# Patient Record
Sex: Female | Born: 1935
Health system: Southern US, Community
[De-identification: ages and names within clinical notes are randomized; demographics above are authoritative.]

## PROBLEM LIST (undated history)

## (undated) DIAGNOSIS — K449 Diaphragmatic hernia without obstruction or gangrene: Secondary | ICD-10-CM

## (undated) DIAGNOSIS — D649 Anemia, unspecified: Secondary | ICD-10-CM

## (undated) DIAGNOSIS — Z8701 Personal history of pneumonia (recurrent): Secondary | ICD-10-CM

## (undated) DIAGNOSIS — E039 Hypothyroidism, unspecified: Secondary | ICD-10-CM

## (undated) DIAGNOSIS — M1990 Unspecified osteoarthritis, unspecified site: Secondary | ICD-10-CM

## (undated) DIAGNOSIS — M940 Chondrocostal junction syndrome [Tietze]: Secondary | ICD-10-CM

## (undated) DIAGNOSIS — C50919 Malignant neoplasm of unspecified site of unspecified female breast: Secondary | ICD-10-CM

## (undated) DIAGNOSIS — Z923 Personal history of irradiation: Secondary | ICD-10-CM

## (undated) DIAGNOSIS — J189 Pneumonia, unspecified organism: Secondary | ICD-10-CM

## (undated) DIAGNOSIS — K648 Other hemorrhoids: Secondary | ICD-10-CM

## (undated) DIAGNOSIS — J40 Bronchitis, not specified as acute or chronic: Secondary | ICD-10-CM

## (undated) DIAGNOSIS — C439 Malignant melanoma of skin, unspecified: Secondary | ICD-10-CM

## (undated) DIAGNOSIS — Z8719 Personal history of other diseases of the digestive system: Secondary | ICD-10-CM

## (undated) DIAGNOSIS — K219 Gastro-esophageal reflux disease without esophagitis: Secondary | ICD-10-CM

## (undated) DIAGNOSIS — J309 Allergic rhinitis, unspecified: Secondary | ICD-10-CM

## (undated) DIAGNOSIS — M7150 Other bursitis, not elsewhere classified, unspecified site: Secondary | ICD-10-CM

## (undated) DIAGNOSIS — M719 Bursopathy, unspecified: Secondary | ICD-10-CM

## (undated) DIAGNOSIS — I10 Essential (primary) hypertension: Secondary | ICD-10-CM

## (undated) DIAGNOSIS — M545 Low back pain: Secondary | ICD-10-CM

## (undated) DIAGNOSIS — D126 Benign neoplasm of colon, unspecified: Secondary | ICD-10-CM

## (undated) DIAGNOSIS — M67919 Unspecified disorder of synovium and tendon, unspecified shoulder: Secondary | ICD-10-CM

## (undated) HISTORY — DX: Other bursitis, not elsewhere classified, unspecified site: M71.50

## (undated) HISTORY — DX: Other hemorrhoids: K64.8

## (undated) HISTORY — PX: ANKLE SURGERY: SHX546

## (undated) HISTORY — DX: Unspecified osteoarthritis, unspecified site: M19.90

## (undated) HISTORY — DX: Malignant melanoma of skin, unspecified: C43.9

## (undated) HISTORY — DX: Bursopathy, unspecified: M71.9

## (undated) HISTORY — DX: Anemia, unspecified: D64.9

## (undated) HISTORY — DX: Personal history of other diseases of the digestive system: Z87.19

## (undated) HISTORY — DX: Personal history of irradiation: Z92.3

## (undated) HISTORY — DX: Chondrocostal junction syndrome (tietze): M94.0

## (undated) HISTORY — DX: Allergic rhinitis, unspecified: J30.9

## (undated) HISTORY — DX: Personal history of pneumonia (recurrent): Z87.01

## (undated) HISTORY — DX: Diaphragmatic hernia without obstruction or gangrene: K44.9

## (undated) HISTORY — DX: Essential (primary) hypertension: I10

## (undated) HISTORY — PX: OTHER SURGICAL HISTORY: SHX169

## (undated) HISTORY — PX: JOINT REPLACEMENT: SHX530

## (undated) HISTORY — DX: Gastro-esophageal reflux disease without esophagitis: K21.9

## (undated) HISTORY — PX: CATARACT EXTRACTION: SUR2

## (undated) HISTORY — DX: Unspecified disorder of synovium and tendon, unspecified shoulder: M67.919

## (undated) HISTORY — DX: Low back pain: M54.5

## (undated) HISTORY — DX: Malignant neoplasm of unspecified site of unspecified female breast: C50.919

## (undated) HISTORY — DX: Benign neoplasm of colon, unspecified: D12.6

## (undated) HISTORY — PX: TONSILLECTOMY AND ADENOIDECTOMY: SHX28

---

## 1966-11-28 HISTORY — PX: BREAST SURGERY: SHX581

## 1977-11-28 HISTORY — PX: CHOLECYSTECTOMY: SHX55

## 1998-11-24 ENCOUNTER — Other Ambulatory Visit: Admission: RE | Admit: 1998-11-24 | Discharge: 1998-11-24 | Payer: Self-pay | Admitting: Internal Medicine

## 1998-11-25 ENCOUNTER — Emergency Department (HOSPITAL_COMMUNITY): Admission: EM | Admit: 1998-11-25 | Discharge: 1998-11-25 | Payer: Self-pay | Admitting: Internal Medicine

## 1998-11-25 ENCOUNTER — Encounter: Payer: Self-pay | Admitting: Orthopedic Surgery

## 1999-06-18 ENCOUNTER — Encounter: Admission: RE | Admit: 1999-06-18 | Discharge: 1999-06-28 | Payer: Self-pay

## 1999-06-30 ENCOUNTER — Encounter: Admission: RE | Admit: 1999-06-30 | Discharge: 1999-09-28 | Payer: Self-pay

## 2001-03-26 ENCOUNTER — Encounter: Payer: Self-pay | Admitting: Family Medicine

## 2001-03-26 ENCOUNTER — Encounter: Admission: RE | Admit: 2001-03-26 | Discharge: 2001-03-26 | Payer: Self-pay | Admitting: Family Medicine

## 2001-04-09 ENCOUNTER — Ambulatory Visit (HOSPITAL_COMMUNITY): Admission: RE | Admit: 2001-04-09 | Discharge: 2001-04-09 | Payer: Self-pay | Admitting: Internal Medicine

## 2002-06-11 ENCOUNTER — Other Ambulatory Visit: Admission: RE | Admit: 2002-06-11 | Discharge: 2002-06-11 | Payer: Self-pay | Admitting: Internal Medicine

## 2002-06-24 ENCOUNTER — Encounter: Payer: Self-pay | Admitting: Family Medicine

## 2002-08-09 ENCOUNTER — Encounter (INDEPENDENT_AMBULATORY_CARE_PROVIDER_SITE_OTHER): Payer: Self-pay | Admitting: Specialist

## 2002-08-09 ENCOUNTER — Ambulatory Visit (HOSPITAL_COMMUNITY): Admission: RE | Admit: 2002-08-09 | Discharge: 2002-08-09 | Payer: Self-pay | Admitting: General Surgery

## 2004-10-05 ENCOUNTER — Emergency Department (HOSPITAL_COMMUNITY): Admission: EM | Admit: 2004-10-05 | Discharge: 2004-10-06 | Payer: Self-pay | Admitting: Emergency Medicine

## 2004-11-12 ENCOUNTER — Ambulatory Visit: Payer: Self-pay | Admitting: Family Medicine

## 2004-12-17 ENCOUNTER — Ambulatory Visit: Payer: Self-pay | Admitting: Family Medicine

## 2005-01-03 ENCOUNTER — Ambulatory Visit: Payer: Self-pay | Admitting: Family Medicine

## 2005-01-17 ENCOUNTER — Ambulatory Visit: Payer: Self-pay | Admitting: Family Medicine

## 2005-03-08 ENCOUNTER — Ambulatory Visit: Payer: Self-pay | Admitting: Family Medicine

## 2005-06-16 ENCOUNTER — Ambulatory Visit: Payer: Self-pay | Admitting: Internal Medicine

## 2005-06-21 ENCOUNTER — Ambulatory Visit: Payer: Self-pay | Admitting: Family Medicine

## 2005-08-02 ENCOUNTER — Ambulatory Visit: Payer: Self-pay | Admitting: Family Medicine

## 2005-10-06 ENCOUNTER — Ambulatory Visit: Payer: Self-pay | Admitting: Family Medicine

## 2005-10-31 ENCOUNTER — Ambulatory Visit: Payer: Self-pay | Admitting: Gastroenterology

## 2005-11-08 ENCOUNTER — Ambulatory Visit: Payer: Self-pay | Admitting: Family Medicine

## 2005-11-28 DIAGNOSIS — D126 Benign neoplasm of colon, unspecified: Secondary | ICD-10-CM

## 2005-11-28 HISTORY — DX: Benign neoplasm of colon, unspecified: D12.6

## 2005-11-29 ENCOUNTER — Ambulatory Visit: Payer: Self-pay | Admitting: Gastroenterology

## 2005-12-19 ENCOUNTER — Ambulatory Visit: Payer: Self-pay | Admitting: Gastroenterology

## 2006-04-19 ENCOUNTER — Ambulatory Visit: Payer: Self-pay | Admitting: Family Medicine

## 2006-07-18 ENCOUNTER — Ambulatory Visit: Payer: Self-pay | Admitting: Family Medicine

## 2006-10-11 ENCOUNTER — Ambulatory Visit: Payer: Self-pay | Admitting: Family Medicine

## 2006-10-18 ENCOUNTER — Ambulatory Visit: Payer: Self-pay | Admitting: Family Medicine

## 2006-10-18 ENCOUNTER — Encounter: Admission: RE | Admit: 2006-10-18 | Discharge: 2006-10-18 | Payer: Self-pay | Admitting: Family Medicine

## 2007-02-09 ENCOUNTER — Ambulatory Visit: Payer: Self-pay | Admitting: Family Medicine

## 2007-03-13 ENCOUNTER — Ambulatory Visit: Payer: Self-pay | Admitting: Gastroenterology

## 2007-04-16 ENCOUNTER — Ambulatory Visit: Payer: Self-pay | Admitting: Family Medicine

## 2007-04-18 ENCOUNTER — Encounter: Payer: Self-pay | Admitting: Family Medicine

## 2007-04-18 DIAGNOSIS — J309 Allergic rhinitis, unspecified: Secondary | ICD-10-CM

## 2007-04-18 DIAGNOSIS — K219 Gastro-esophageal reflux disease without esophagitis: Secondary | ICD-10-CM

## 2007-04-18 HISTORY — DX: Gastro-esophageal reflux disease without esophagitis: K21.9

## 2007-04-18 HISTORY — DX: Allergic rhinitis, unspecified: J30.9

## 2007-07-06 ENCOUNTER — Ambulatory Visit: Payer: Self-pay | Admitting: Family Medicine

## 2007-07-06 DIAGNOSIS — R109 Unspecified abdominal pain: Secondary | ICD-10-CM | POA: Insufficient documentation

## 2007-07-06 LAB — CONVERTED CEMR LAB
Bilirubin Urine: NEGATIVE
Glucose, Urine, Semiquant: NEGATIVE
Ketones, urine, test strip: NEGATIVE
pH: 7.5

## 2007-08-07 ENCOUNTER — Ambulatory Visit: Payer: Self-pay | Admitting: Family Medicine

## 2007-08-07 DIAGNOSIS — J019 Acute sinusitis, unspecified: Secondary | ICD-10-CM

## 2007-10-24 ENCOUNTER — Ambulatory Visit: Payer: Self-pay | Admitting: Family Medicine

## 2007-10-24 DIAGNOSIS — J209 Acute bronchitis, unspecified: Secondary | ICD-10-CM | POA: Insufficient documentation

## 2007-11-05 ENCOUNTER — Ambulatory Visit: Payer: Self-pay | Admitting: Family Medicine

## 2007-11-05 ENCOUNTER — Encounter: Payer: Self-pay | Admitting: Family Medicine

## 2007-11-05 DIAGNOSIS — M940 Chondrocostal junction syndrome [Tietze]: Secondary | ICD-10-CM | POA: Insufficient documentation

## 2007-11-05 HISTORY — DX: Chondrocostal junction syndrome (tietze): M94.0

## 2008-01-04 ENCOUNTER — Ambulatory Visit: Payer: Self-pay | Admitting: Family Medicine

## 2008-01-04 DIAGNOSIS — M7150 Other bursitis, not elsewhere classified, unspecified site: Secondary | ICD-10-CM | POA: Insufficient documentation

## 2008-01-04 HISTORY — DX: Other bursitis, not elsewhere classified, unspecified site: M71.50

## 2008-01-29 ENCOUNTER — Ambulatory Visit: Payer: Self-pay | Admitting: Gastroenterology

## 2008-02-12 ENCOUNTER — Ambulatory Visit: Payer: Self-pay | Admitting: Family Medicine

## 2008-02-12 DIAGNOSIS — B9789 Other viral agents as the cause of diseases classified elsewhere: Secondary | ICD-10-CM | POA: Insufficient documentation

## 2008-02-13 LAB — CONVERTED CEMR LAB
ALT: 19 units/L (ref 0–35)
AST: 17 units/L (ref 0–37)
Basophils Relative: 0.8 % (ref 0.0–1.0)
Bilirubin Urine: NEGATIVE
Bilirubin, Direct: 0.2 mg/dL (ref 0.0–0.3)
CO2: 31 meq/L (ref 19–32)
Calcium: 9.9 mg/dL (ref 8.4–10.5)
Chloride: 106 meq/L (ref 96–112)
Creatinine, Ser: 1 mg/dL (ref 0.4–1.2)
Eosinophils Relative: 2.1 % (ref 0.0–5.0)
GFR calc non Af Amer: 57 mL/min
Glucose, Bld: 80 mg/dL (ref 70–99)
Glucose, Urine, Semiquant: NEGATIVE
Platelets: 242 10*3/uL (ref 150–400)
Protein, U semiquant: NEGATIVE
RBC: 4.57 M/uL (ref 3.87–5.11)
RDW: 13.7 % (ref 11.5–14.6)
Total Protein: 6.7 g/dL (ref 6.0–8.3)
WBC: 8.9 10*3/uL (ref 4.5–10.5)
pH: 7

## 2008-02-19 ENCOUNTER — Emergency Department (HOSPITAL_COMMUNITY): Admission: EM | Admit: 2008-02-19 | Discharge: 2008-02-19 | Payer: Self-pay | Admitting: Emergency Medicine

## 2008-03-25 ENCOUNTER — Ambulatory Visit: Payer: Self-pay | Admitting: Internal Medicine

## 2008-03-25 ENCOUNTER — Encounter: Payer: Self-pay | Admitting: Family Medicine

## 2008-04-03 ENCOUNTER — Telehealth: Payer: Self-pay | Admitting: Family Medicine

## 2008-04-28 ENCOUNTER — Encounter: Payer: Self-pay | Admitting: Gastroenterology

## 2008-07-04 ENCOUNTER — Ambulatory Visit: Payer: Self-pay | Admitting: Family Medicine

## 2008-07-18 ENCOUNTER — Ambulatory Visit: Payer: Self-pay | Admitting: Family Medicine

## 2008-07-18 ENCOUNTER — Telehealth: Payer: Self-pay | Admitting: Family Medicine

## 2008-07-21 LAB — CONVERTED CEMR LAB
Albumin: 4.3 g/dL (ref 3.5–5.2)
BUN: 19 mg/dL (ref 6–23)
Bilirubin Urine: NEGATIVE
Calcium: 9.9 mg/dL (ref 8.4–10.5)
Creatinine, Ser: 1.08 mg/dL (ref 0.40–1.20)
Eosinophils Absolute: 0.2 10*3/uL (ref 0.0–0.7)
Eosinophils Relative: 2 % (ref 0–5)
Glucose, Bld: 89 mg/dL (ref 70–99)
Glucose, Urine, Semiquant: NEGATIVE
HCT: 38.9 % (ref 36.0–46.0)
Lymphs Abs: 3.3 10*3/uL (ref 0.7–4.0)
MCV: 88 fL (ref 78.0–100.0)
Monocytes Absolute: 0.6 10*3/uL (ref 0.1–1.0)
Monocytes Relative: 8 % (ref 3–12)
Platelets: 218 10*3/uL (ref 150–400)
RBC: 4.42 M/uL (ref 3.87–5.11)
Total Bilirubin: 0.3 mg/dL (ref 0.3–1.2)
Total CK: 54 units/L (ref 7–177)
WBC: 8.1 10*3/uL (ref 4.0–10.5)
pH: 7

## 2008-07-23 ENCOUNTER — Ambulatory Visit: Payer: Self-pay | Admitting: Family Medicine

## 2008-07-23 DIAGNOSIS — N3000 Acute cystitis without hematuria: Secondary | ICD-10-CM

## 2008-08-25 ENCOUNTER — Ambulatory Visit: Payer: Self-pay | Admitting: Family Medicine

## 2008-08-25 DIAGNOSIS — K5289 Other specified noninfective gastroenteritis and colitis: Secondary | ICD-10-CM

## 2008-08-25 LAB — CONVERTED CEMR LAB
Glucose, Urine, Semiquant: NEGATIVE
Nitrite: NEGATIVE
Specific Gravity, Urine: >=1.03
WBC Urine, dipstick: NEGATIVE
pH: 5

## 2008-09-29 ENCOUNTER — Ambulatory Visit: Payer: Self-pay | Admitting: Family Medicine

## 2008-12-15 ENCOUNTER — Ambulatory Visit: Payer: Self-pay | Admitting: Family Medicine

## 2008-12-15 LAB — CONVERTED CEMR LAB
Blood in Urine, dipstick: NEGATIVE
Nitrite: POSITIVE
Specific Gravity, Urine: 1.01
Urobilinogen, UA: 0.2

## 2008-12-16 ENCOUNTER — Encounter: Payer: Self-pay | Admitting: Family Medicine

## 2009-02-11 ENCOUNTER — Ambulatory Visit: Payer: Self-pay | Admitting: Family Medicine

## 2009-04-10 ENCOUNTER — Ambulatory Visit: Payer: Self-pay | Admitting: Family Medicine

## 2009-04-10 LAB — CONVERTED CEMR LAB
Bilirubin Urine: NEGATIVE
Blood in Urine, dipstick: NEGATIVE
Ketones, urine, test strip: NEGATIVE
Nitrite: NEGATIVE
Protein, U semiquant: NEGATIVE
Urobilinogen, UA: 0.2

## 2009-05-15 ENCOUNTER — Ambulatory Visit: Payer: Self-pay | Admitting: Family Medicine

## 2009-05-27 ENCOUNTER — Ambulatory Visit: Payer: Self-pay | Admitting: Family Medicine

## 2009-07-03 ENCOUNTER — Ambulatory Visit: Payer: Self-pay | Admitting: Family Medicine

## 2009-07-03 DIAGNOSIS — N390 Urinary tract infection, site not specified: Secondary | ICD-10-CM

## 2009-07-03 LAB — CONVERTED CEMR LAB
Bilirubin Urine: NEGATIVE
Blood in Urine, dipstick: NEGATIVE
Glucose, Urine, Semiquant: NEGATIVE
Ketones, urine, test strip: NEGATIVE
Protein, U semiquant: NEGATIVE
pH: 6

## 2009-07-04 ENCOUNTER — Encounter: Payer: Self-pay | Admitting: Family Medicine

## 2009-07-13 ENCOUNTER — Ambulatory Visit: Payer: Self-pay | Admitting: Family Medicine

## 2009-07-13 DIAGNOSIS — M545 Low back pain, unspecified: Secondary | ICD-10-CM

## 2009-07-13 HISTORY — DX: Low back pain, unspecified: M54.50

## 2009-08-14 ENCOUNTER — Ambulatory Visit: Payer: Self-pay | Admitting: Family Medicine

## 2009-08-19 ENCOUNTER — Emergency Department (HOSPITAL_COMMUNITY): Admission: EM | Admit: 2009-08-19 | Discharge: 2009-08-19 | Payer: Self-pay | Admitting: Family Medicine

## 2009-08-19 ENCOUNTER — Telehealth: Payer: Self-pay | Admitting: Family Medicine

## 2009-09-11 ENCOUNTER — Ambulatory Visit: Payer: Self-pay | Admitting: Family Medicine

## 2009-09-15 ENCOUNTER — Ambulatory Visit: Payer: Self-pay | Admitting: Family Medicine

## 2009-09-30 ENCOUNTER — Ambulatory Visit: Payer: Self-pay | Admitting: Family Medicine

## 2009-09-30 DIAGNOSIS — I10 Essential (primary) hypertension: Secondary | ICD-10-CM | POA: Insufficient documentation

## 2009-09-30 DIAGNOSIS — R05 Cough: Secondary | ICD-10-CM

## 2009-09-30 DIAGNOSIS — R059 Cough, unspecified: Secondary | ICD-10-CM | POA: Insufficient documentation

## 2009-09-30 HISTORY — DX: Essential (primary) hypertension: I10

## 2009-10-02 LAB — CONVERTED CEMR LAB
AST: 15 units/L (ref 0–37)
Albumin: 3.9 g/dL (ref 3.5–5.2)
Alkaline Phosphatase: 49 units/L (ref 39–117)
Basophils Absolute: 0 10*3/uL (ref 0.0–0.1)
Basophils Relative: 0.4 % (ref 0.0–3.0)
CO2: 31 meq/L (ref 19–32)
Chloride: 103 meq/L (ref 96–112)
Eosinophils Absolute: 0.3 10*3/uL (ref 0.0–0.7)
Glucose, Bld: 86 mg/dL (ref 70–99)
HCT: 35.5 % — ABNORMAL LOW (ref 36.0–46.0)
Hemoglobin: 12.2 g/dL (ref 12.0–15.0)
Lymphocytes Relative: 37.9 % (ref 12.0–46.0)
Lymphs Abs: 2.8 10*3/uL (ref 0.7–4.0)
MCHC: 34.3 g/dL (ref 30.0–36.0)
Neutro Abs: 3.9 10*3/uL (ref 1.4–7.7)
Potassium: 4.7 meq/L (ref 3.5–5.1)
RBC: 4.04 M/uL (ref 3.87–5.11)
RDW: 14 % (ref 11.5–14.6)
Sodium: 141 meq/L (ref 135–145)
TSH: 0.44 microintl units/mL (ref 0.35–5.50)
Total Protein: 7.1 g/dL (ref 6.0–8.3)

## 2009-11-05 ENCOUNTER — Encounter: Payer: Self-pay | Admitting: Family Medicine

## 2009-12-17 ENCOUNTER — Ambulatory Visit: Payer: Self-pay | Admitting: Family Medicine

## 2010-01-13 ENCOUNTER — Ambulatory Visit: Payer: Self-pay | Admitting: Family Medicine

## 2010-01-13 LAB — CONVERTED CEMR LAB
Bilirubin Urine: NEGATIVE
Ketones, urine, test strip: NEGATIVE
Nitrite: NEGATIVE
Specific Gravity, Urine: <1.005
Urobilinogen, UA: 0.2

## 2010-01-19 ENCOUNTER — Ambulatory Visit: Payer: Self-pay | Admitting: Family Medicine

## 2010-02-09 ENCOUNTER — Encounter: Admission: RE | Admit: 2010-02-09 | Discharge: 2010-02-09 | Payer: Self-pay | Admitting: Family Medicine

## 2010-02-12 ENCOUNTER — Encounter: Admission: RE | Admit: 2010-02-12 | Discharge: 2010-02-12 | Payer: Self-pay | Admitting: Family Medicine

## 2010-02-16 ENCOUNTER — Encounter: Payer: Self-pay | Admitting: Family Medicine

## 2010-03-02 ENCOUNTER — Telehealth: Payer: Self-pay | Admitting: Family Medicine

## 2010-03-26 ENCOUNTER — Ambulatory Visit: Payer: Self-pay | Admitting: Family Medicine

## 2010-05-06 ENCOUNTER — Ambulatory Visit: Payer: Self-pay | Admitting: Family Medicine

## 2010-05-06 DIAGNOSIS — M719 Bursopathy, unspecified: Secondary | ICD-10-CM

## 2010-05-06 DIAGNOSIS — M67919 Unspecified disorder of synovium and tendon, unspecified shoulder: Secondary | ICD-10-CM | POA: Insufficient documentation

## 2010-05-06 HISTORY — DX: Unspecified disorder of synovium and tendon, unspecified shoulder: M67.919

## 2010-05-24 ENCOUNTER — Ambulatory Visit: Payer: Self-pay | Admitting: Family Medicine

## 2010-05-24 DIAGNOSIS — R5381 Other malaise: Secondary | ICD-10-CM

## 2010-05-24 DIAGNOSIS — R5383 Other fatigue: Secondary | ICD-10-CM

## 2010-05-24 LAB — CONVERTED CEMR LAB
Protein, U semiquant: NEGATIVE
Urobilinogen, UA: 0.2
WBC Urine, dipstick: NEGATIVE

## 2010-05-26 LAB — CONVERTED CEMR LAB
Albumin: 4.3 g/dL (ref 3.5–5.2)
Alkaline Phosphatase: 59 units/L (ref 39–117)
BUN: 28 mg/dL — ABNORMAL HIGH (ref 6–23)
Basophils Absolute: 0.1 10*3/uL (ref 0.0–0.1)
CO2: 29 meq/L (ref 19–32)
Calcium: 9.4 mg/dL (ref 8.4–10.5)
Creatinine, Ser: 1.2 mg/dL (ref 0.4–1.2)
Eosinophils Absolute: 0.1 10*3/uL (ref 0.0–0.7)
GFR calc non Af Amer: 46.56 mL/min (ref >60)
Glucose, Bld: 88 mg/dL (ref 70–99)
Hemoglobin: 12.4 g/dL (ref 12.0–15.0)
Lymphocytes Relative: 31.9 % (ref 12.0–46.0)
MCHC: 33.7 g/dL (ref 30.0–36.0)
Neutro Abs: 5.5 10*3/uL (ref 1.4–7.7)
Neutrophils Relative %: 58.8 % (ref 43.0–77.0)
Platelets: 222 10*3/uL (ref 150.0–400.0)
RDW: 14.7 % — ABNORMAL HIGH (ref 11.5–14.6)
Total Bilirubin: 0.2 mg/dL — ABNORMAL LOW (ref 0.3–1.2)

## 2010-05-27 ENCOUNTER — Ambulatory Visit: Payer: Self-pay | Admitting: Family Medicine

## 2010-06-08 LAB — CONVERTED CEMR LAB
Free T4: 0.75 ng/dL (ref 0.60–1.60)
T3, Free: 2.7 pg/mL (ref 2.3–4.2)

## 2010-07-19 ENCOUNTER — Ambulatory Visit: Payer: Self-pay | Admitting: Family Medicine

## 2010-08-15 ENCOUNTER — Encounter: Payer: Self-pay | Admitting: Family Medicine

## 2010-08-16 ENCOUNTER — Encounter: Payer: Self-pay | Admitting: Family Medicine

## 2010-09-07 ENCOUNTER — Ambulatory Visit: Payer: Self-pay | Admitting: Family Medicine

## 2010-09-14 ENCOUNTER — Ambulatory Visit: Payer: Self-pay | Admitting: Family Medicine

## 2010-09-16 LAB — CONVERTED CEMR LAB
ALT: 14 units/L (ref 0–35)
Alkaline Phosphatase: 63 units/L (ref 39–117)
Basophils Relative: 0.9 % (ref 0.0–3.0)
Bilirubin, Direct: 0.1 mg/dL (ref 0.0–0.3)
Calcium: 9.9 mg/dL (ref 8.4–10.5)
Chloride: 105 meq/L (ref 96–112)
Creatinine, Ser: 1.1 mg/dL (ref 0.4–1.2)
Eosinophils Relative: 2.1 % (ref 0.0–5.0)
Lymphocytes Relative: 41.6 % (ref 12.0–46.0)
MCV: 86.2 fL (ref 78.0–100.0)
Neutrophils Relative %: 47.1 % (ref 43.0–77.0)
RBC: 4.2 M/uL (ref 3.87–5.11)
Sodium: 140 meq/L (ref 135–145)
Total Protein: 6.2 g/dL (ref 6.0–8.3)
WBC: 6.6 10*3/uL (ref 4.5–10.5)

## 2010-09-27 ENCOUNTER — Ambulatory Visit: Payer: Self-pay | Admitting: Family Medicine

## 2010-10-06 ENCOUNTER — Encounter: Payer: Self-pay | Admitting: Family Medicine

## 2010-11-28 DIAGNOSIS — C50919 Malignant neoplasm of unspecified site of unspecified female breast: Secondary | ICD-10-CM

## 2010-11-28 HISTORY — DX: Malignant neoplasm of unspecified site of unspecified female breast: C50.919

## 2010-12-08 ENCOUNTER — Encounter: Payer: Self-pay | Admitting: Gastroenterology

## 2010-12-29 ENCOUNTER — Encounter: Payer: Self-pay | Admitting: Family Medicine

## 2010-12-29 ENCOUNTER — Ambulatory Visit (INDEPENDENT_AMBULATORY_CARE_PROVIDER_SITE_OTHER): Payer: Medicare Other | Admitting: Family Medicine

## 2010-12-29 VITALS — BP 118/80 | HR 72 | Temp 98.4°F | Resp 14 | Ht 63.0 in | Wt 184.0 lb

## 2010-12-29 DIAGNOSIS — R5381 Other malaise: Secondary | ICD-10-CM

## 2010-12-29 DIAGNOSIS — R5383 Other fatigue: Secondary | ICD-10-CM

## 2010-12-29 DIAGNOSIS — N63 Unspecified lump in unspecified breast: Secondary | ICD-10-CM

## 2010-12-29 DIAGNOSIS — N6320 Unspecified lump in the left breast, unspecified quadrant: Secondary | ICD-10-CM

## 2010-12-29 NOTE — Progress Notes (Deleted)
  Subjective:    Patient ID: April [name], female    DOB: October 28, 1929, 77 y.o.   MRN: [id number]  HPI Pa    Review of Systems     Objective:   Physical Exam        Assessment & Plan:

## 2010-12-29 NOTE — Progress Notes (Signed)
  Subjective:    Patient ID: April [name], female    DOB: 1929-06-03, 77 y.o.   MRN: [id number]  HPI  Patient seen with fatigue past several days. 2 days ago had some diarrhea and nausea and poor appetite. No vomiting. Diarrhea has resolved. Poor appetite for several days now. No significant weight changes. Increased fatigue is her main complaint. History of what looks like subclinical hyperthyroidism in the past. Thyroid functions checked several months ago with normal T3 and T4 and slightly low TSH. Denies any tremor or tachycardia. Patient denies any recent fever, nasal congestion, cough, or dysuria.    Separate problem of left breast mass noted 2 weeks ago. No nipple discharge. Remote history of left breast biopsy which was benign in 1968    Review of Systems  Constitutional: Positive for appetite change and fatigue. Negative for fever, chills, diaphoresis, activity change and unexpected weight change.  HENT: Negative for congestion, sore throat, rhinorrhea and sinus pressure.   Eyes: Negative for visual disturbance.  Respiratory: Negative for cough, shortness of breath and wheezing.   Cardiovascular: Negative for chest pain, palpitations and leg swelling.  Gastrointestinal: Negative for abdominal pain and blood in stool.  Genitourinary: Negative for difficulty urinating.  Musculoskeletal: Negative for back pain.  Neurological: Negative for dizziness and headaches.  Hematological: Negative for adenopathy. Does not bruise/bleed easily.  Psychiatric/Behavioral: Negative for dysphoric mood.       Objective:   Physical Exam Patient is alert and in no distress. HEENT exam oropharynx is clear. Eardrums no acute change. Neck is supple no adenopathy or thyromegaly. Chest clear to auscultation. Heart regular rhythm and rate. Breast exam reveals no nipple discharge. Right breast no masses. Left breast lateral scar. In the region of the scar she has a palpated slightly tender thickened relatively  immobile mass approximately one and 1/2 cm in dimension. This is in the mid outer quadrant. No axillary adenopathy Abdomen is soft and nontender without mass.  extremities no edema        Assessment & Plan:  #1  fatigue in a patient with history of subclinical hyperthyroidism. Recheck TSH and T4 #2  new problem of recently noted left breast mass. Order diagnostic mammogram and ultrasound if indicated

## 2010-12-29 NOTE — Patient Instructions (Signed)
We will call you regarding mammogram appointment.

## 2010-12-30 ENCOUNTER — Telehealth: Payer: Self-pay | Admitting: *Deleted

## 2010-12-30 DIAGNOSIS — N6320 Unspecified lump in the left breast, unspecified quadrant: Secondary | ICD-10-CM

## 2010-12-30 NOTE — Assessment & Plan Note (Signed)
Summary: SORE THROAT, WEAKNESS, FATIGUE // RS   Vital Signs:  Patient profile:   77 year old female Weight:      184 pounds Temp:     98.5 degrees F oral Pulse rate:   73 / minute BP sitting:   122 / 64  (left arm) Cuff size:   large  Vitals Entered By: Alfred Levins, CMA (January 13, 2010 11:31 AM) CC: weak, chills, st   History of Present Illness: Here for the sudden onset yesterday of weakness, chills, and a mild ST. No fever or cough or body aches. No NVD. No urinary symptoms.   Allergies: 1)  Cefaclor (Cefaclor) 2)  Penicillin G Potassium (Penicillin G Potassium) 3)  Prevacid (Lansoprazole) 4)  Sulfamethoxazole (Sulfamethoxazole)  Past History:  Past Medical History: Reviewed history from 09/30/2009 and no changes required. GERD Melanoma Allergic rhinitis Hypertension, sees Dr. Jacinto Halim   Past Surgical History: Reviewed history from 01/04/2008 and no changes required. Lt Breast Biopsy (non cancerous) T&A Lt TKA Cholecystectomy  Review of Systems  The patient denies anorexia, fever, weight loss, weight gain, vision loss, decreased hearing, hoarseness, chest pain, syncope, dyspnea on exertion, peripheral edema, prolonged cough, headaches, hemoptysis, abdominal pain, melena, hematochezia, severe indigestion/heartburn, hematuria, incontinence, genital sores, muscle weakness, suspicious skin lesions, transient blindness, difficulty walking, depression, unusual weight change, abnormal bleeding, enlarged lymph nodes, angioedema, breast masses, and testicular masses.    Physical Exam  General:  Well-developed,well-nourished,in no acute distress; alert,appropriate and cooperative throughout examination Head:  Normocephalic and atraumatic without obvious abnormalities. No apparent alopecia or balding. Eyes:  No corneal or conjunctival inflammation noted. EOMI. Perrla. Funduscopic exam benign, without hemorrhages, exudates or papilledema. Vision grossly normal. Ears:   External ear exam shows no significant lesions or deformities.  Otoscopic examination reveals clear canals, tympanic membranes are intact bilaterally without bulging, retraction, inflammation or discharge. Hearing is grossly normal bilaterally. Nose:  External nasal examination shows no deformity or inflammation. Nasal mucosa are pink and moist without lesions or exudates. Mouth:  Oral mucosa and oropharynx without lesions or exudates.  Teeth in good repair. Neck:  No deformities, masses, or tenderness noted. Chest Wall:  No deformities, masses, or tenderness noted. Lungs:  Normal respiratory effort, chest expands symmetrically. Lungs are clear to auscultation, no crackles or wheezes. Heart:  Normal rate and regular rhythm. S1 and S2 normal without gallop, murmur, click, rub or other extra sounds. Abdomen:  Bowel sounds positive,abdomen soft and non-tender without masses, organomegaly or hernias noted.   Impression & Recommendations:  Problem # 1:  VIRAL INFECTION (ICD-079.99)  Her updated medication list for this problem includes:    Adult Aspirin Low Strength 81 Mg Chew (Aspirin) .Marland Kitchen... Take 1 tablet by mouth once a day    Diclofenac Sodium 50 Mg Tbec (Diclofenac sodium) .Marland Kitchen... Three times a day as needed pain  Orders: UA Dipstick w/o Micro (manual) (43329)  Complete Medication List: 1)  Adult Aspirin Low Strength 81 Mg Chew (Aspirin) .... Take 1 tablet by mouth once a day 2)  Lisinopril 20 Mg Tabs (Lisinopril) .Marland Kitchen.. 1 tablet by mouth once a day 3)  Metamucil 48.57 % Powd (Psyllium) .... Take 4)  Omeprazole 40 Mg Cpdr (Omeprazole) .Marland Kitchen.. 1 by mouth once daily 5)  Vitamin D 1000 Unit Tabs (Cholecalciferol) .Marland Kitchen.. 1 by mouth once daily 6)  Vitamin D 51884 Unit Caps (Ergocalciferol) .Marland Kitchen.. 1 by mouth once daily 7)  Diclofenac Sodium 50 Mg Tbec (Diclofenac sodium) .... Three times a day as needed pain  8)  Promethazine Hcl 25 Mg Tabs (Promethazine hcl) .Marland Kitchen.. 1 every 4 hours as needed  nausea  Patient Instructions: 1)  rest, drink fluids.  2)  Please schedule a follow-up appointment as needed .   Laboratory Results   Urine Tests  Date/Time Received: January 13, 2010 12:48 PM Date/Time Reported: January 13, 2010 12:48 PM  Routine Urinalysis   Color: yellow Appearance: Clear Glucose: negative   (Normal Range: Negative) Bilirubin: negative   (Normal Range: Negative) Ketone: negative   (Normal Range: Negative) Spec. Gravity: <1.005   (Normal Range: 1.003-1.035) Blood: negative   (Normal Range: Negative) pH: 7.0   (Normal Range: 5.0-8.0) Protein: negative   (Normal Range: Negative) Urobilinogen: 0.2   (Normal Range: 0-1) Nitrite: negative   (Normal Range: Negative) Leukocyte Esterace: negative   (Normal Range: Negative)    Comments: Alfred Levins, CMA  January 13, 2010 12:48 PM

## 2010-12-30 NOTE — Assessment & Plan Note (Signed)
Summary: ?? sinus infection??//lch   Vital Signs:  Patient profile:   77 year old female Weight:      178 pounds O2 Sat:      97 % Temp:     98.5 degrees F Pulse rate:   86 / minute BP sitting:   130 / 82  (left arm)  Vitals Entered By: Pura Spice, RN (September 07, 2010 10:34 AM) CC: sinus inf. sore throat, nauseaed.   History of Present Illness: Here for 3 days of sinus pressure, HA, PND, and a ST. No fever or cough.   Allergies: 1)  Cefaclor (Cefaclor) 2)  Penicillin G Potassium (Penicillin G Potassium) 3)  Prevacid (Lansoprazole) 4)  Sulfamethoxazole (Sulfamethoxazole)  Past History:  Past Medical History: Reviewed history from 09/30/2009 and no changes required. GERD Melanoma Allergic rhinitis Hypertension, sees Dr. Jacinto Halim   Review of Systems  The patient denies anorexia, fever, weight loss, weight gain, vision loss, decreased hearing, hoarseness, chest pain, syncope, dyspnea on exertion, peripheral edema, prolonged cough, hemoptysis, abdominal pain, melena, hematochezia, severe indigestion/heartburn, hematuria, incontinence, genital sores, muscle weakness, suspicious skin lesions, transient blindness, difficulty walking, depression, unusual weight change, abnormal bleeding, enlarged lymph nodes, angioedema, breast masses, and testicular masses.    Physical Exam  General:  Well-developed,well-nourished,in no acute distress; alert,appropriate and cooperative throughout examination Head:  Normocephalic and atraumatic without obvious abnormalities. No apparent alopecia or balding. Eyes:  No corneal or conjunctival inflammation noted. EOMI. Perrla. Funduscopic exam benign, without hemorrhages, exudates or papilledema. Vision grossly normal. Ears:  External ear exam shows no significant lesions or deformities.  Otoscopic examination reveals clear canals, tympanic membranes are intact bilaterally without bulging, retraction, inflammation or discharge. Hearing is grossly  normal bilaterally. Nose:  External nasal examination shows no deformity or inflammation. Nasal mucosa are pink and moist without lesions or exudates. Mouth:  Oral mucosa and oropharynx without lesions or exudates.  Teeth in good repair. Neck:  No deformities, masses, or tenderness noted. Lungs:  Normal respiratory effort, chest expands symmetrically. Lungs are clear to auscultation, no crackles or wheezes.   Impression & Recommendations:  Problem # 1:  SINUSITIS, ACUTE NOS (ICD-461.9)  Her updated medication list for this problem includes:    Doxycycline Hyclate 100 Mg Caps (Doxycycline hyclate) .Marland Kitchen..Marland Kitchen Two times a day  Complete Medication List: 1)  Adult Aspirin Low Strength 81 Mg Chew (Aspirin) .... Take 1 tablet by mouth once a day 2)  Lisinopril 20 Mg Tabs (Lisinopril) .Marland Kitchen.. 1 tablet by mouth once a day 3)  Metamucil 48.57 % Powd (Psyllium) .... Take 4)  Omeprazole 40 Mg Cpdr (Omeprazole) .Marland Kitchen.. 1 by mouth once daily 5)  Vitamin D 1000 Unit Tabs (Cholecalciferol) .Marland Kitchen.. 1 by mouth once daily 6)  Vitamin D 44034 Unit Caps (Ergocalciferol) .Marland Kitchen.. 1 by mouth once daily 7)  Diclofenac Sodium 50 Mg Tbec (Diclofenac sodium) .... Three times a day as needed pain 8)  Promethazine Hcl 25 Mg Tabs (Promethazine hcl) .Marland Kitchen.. 1 every 4 hours as needed nausea 9)  Doxycycline Hyclate 100 Mg Caps (Doxycycline hyclate) .... Two times a day  Patient Instructions: 1)  Please schedule a follow-up appointment as needed .  Prescriptions: DOXYCYCLINE HYCLATE 100 MG CAPS (DOXYCYCLINE HYCLATE) two times a day  #20 x 0   Entered and Authorized by:   Nelwyn Salisbury MD   Signed by:   Nelwyn Salisbury MD on 09/07/2010   Method used:   Electronically to        [facility]* (retail)       [address]       [place], [place]  [id number]       Ph: [phone]       Fax: [phone]   RxID:   [id number]

## 2010-12-30 NOTE — Assessment & Plan Note (Signed)
Summary: SORE THROAT // RS   Vital Signs:  Patient profile:   77 year old female O2 Sat:      98 % Temp:     98.3 degrees F Pulse rate:   85 / minute BP sitting:   120 / 80  (left arm) Cuff size:   regular  Vitals Entered By: Pura Spice, RN (September 27, 2010 2:23 PM) CC: continous sore throat    Contraindications/Deferment of Procedures/Staging:    Test/Procedure: Weight Refused    Reason for deferment: patient declined-cannot calculate BMI   History of Present Illness: Here for continued sinus pressure, PND, and ST which has been bothering her for several months. Antibiotics have not helped very much. No fevers. Her recent labs were all normal. She has not seen an allergist for 25 years .   Allergies: 1)  Cefaclor (Cefaclor) 2)  Penicillin G Potassium (Penicillin G Potassium) 3)  Prevacid (Lansoprazole) 4)  Sulfamethoxazole (Sulfamethoxazole)  Past History:  Past Medical History: Reviewed history from 09/30/2009 and no changes required. GERD Melanoma Allergic rhinitis Hypertension, sees Dr. Jacinto Halim   Past Surgical History: Reviewed history from 01/04/2008 and no changes required. Lt Breast Biopsy (non cancerous) T&A Lt TKA Cholecystectomy  Review of Systems  The patient denies anorexia, fever, weight loss, weight gain, vision loss, decreased hearing, hoarseness, chest pain, syncope, dyspnea on exertion, peripheral edema, prolonged cough, headaches, hemoptysis, abdominal pain, melena, hematochezia, severe indigestion/heartburn, hematuria, incontinence, genital sores, muscle weakness, suspicious skin lesions, transient blindness, difficulty walking, depression, unusual weight change, abnormal bleeding, enlarged lymph nodes, angioedema, breast masses, and testicular masses.    Physical Exam  General:  Well-developed,well-nourished,in no acute distress; alert,appropriate and cooperative throughout examination Head:  Normocephalic and atraumatic without obvious  abnormalities. No apparent alopecia or balding. Eyes:  No corneal or conjunctival inflammation noted. EOMI. Perrla. Funduscopic exam benign, without hemorrhages, exudates or papilledema. Vision grossly normal. Ears:  External ear exam shows no significant lesions or deformities.  Otoscopic examination reveals clear canals, tympanic membranes are intact bilaterally without bulging, retraction, inflammation or discharge. Hearing is grossly normal bilaterally. Nose:  External nasal examination shows no deformity or inflammation. Nasal mucosa are pink and moist without lesions or exudates. Mouth:  Oral mucosa and oropharynx without lesions or exudates.  Teeth in good repair. Neck:  No deformities, masses, or tenderness noted. Lungs:  Normal respiratory effort, chest expands symmetrically. Lungs are clear to auscultation, no crackles or wheezes.   Impression & Recommendations:  Problem # 1:  ALLERGIC RHINITIS (ICD-477.9)  Her updated medication list for this problem includes:    Promethazine Hcl 25 Mg Tabs (Promethazine hcl) .Marland Kitchen... 1 every 4 hours as needed nausea  Orders: Allergy Referral  (Allergy)  Problem # 2:  WEAKNESS (ICD-780.79)  Complete Medication List: 1)  Adult Aspirin Low Strength 81 Mg Chew (Aspirin) .... Take 1 tablet by mouth once a day 2)  Lisinopril 20 Mg Tabs (Lisinopril) .Marland Kitchen.. 1 tablet by mouth once a day 3)  Metamucil 48.57 % Powd (Psyllium) .... Take 4)  Omeprazole 40 Mg Cpdr (Omeprazole) .Marland Kitchen.. 1 by mouth once daily 5)  Vitamin D 1000 Unit Tabs (Cholecalciferol) .Marland Kitchen.. 1 by mouth once daily 6)  Vitamin D 96045 Unit Caps (Ergocalciferol) .Marland Kitchen.. 1 by mouth once daily 7)  Diclofenac Sodium 50 Mg Tbec (Diclofenac sodium) .... Three times a day as needed pain 8)  Promethazine Hcl 25 Mg Tabs (Promethazine hcl) .Marland Kitchen.. 1 every 4 hours as needed nausea  Patient Instructions: 1)  I think a lot of her problems are allergic in nature. We will refer her to Allergy for a workup  .   Orders Added: 1)  Est. Patient Level IV [[address]] 2)  Allergy Referral  [Allergy]

## 2010-12-30 NOTE — Assessment & Plan Note (Signed)
Summary: arm pain/njr   Vital Signs:  Patient profile:   77 year old female Weight:      188 pounds BP sitting:   116 / 70  (left arm) Cuff size:   regular  Vitals Entered By: Raechel Ache, RN (March 26, 2010 11:05 AM) CC: C/o R shoulder pain x weeks.   History of Present Illness: here with another bout of pain in the anterior right shoulder for 2 weeks. No trauma. She has a hx of bursitis in this area. using heat.   Allergies: 1)  Cefaclor (Cefaclor) 2)  Penicillin G Potassium (Penicillin G Potassium) 3)  Prevacid (Lansoprazole) 4)  Sulfamethoxazole (Sulfamethoxazole)  Past History:  Past Medical History: Reviewed history from 09/30/2009 and no changes required. GERD Melanoma Allergic rhinitis Hypertension, sees Dr. Jacinto Halim   Past Surgical History: Reviewed history from 01/04/2008 and no changes required. Lt Breast Biopsy (non cancerous) T&A Lt TKA Cholecystectomy  Review of Systems  The patient denies anorexia, fever, weight loss, weight gain, vision loss, decreased hearing, hoarseness, chest pain, syncope, dyspnea on exertion, peripheral edema, prolonged cough, headaches, hemoptysis, abdominal pain, melena, hematochezia, severe indigestion/heartburn, hematuria, incontinence, genital sores, muscle weakness, suspicious skin lesions, transient blindness, difficulty walking, depression, unusual weight change, abnormal bleeding, enlarged lymph nodes, angioedema, breast masses, and testicular masses.    Physical Exam  General:  Well-developed,well-nourished,in no acute distress; alert,appropriate and cooperative throughout examination Msk:  tender in the anterior right shoulder, full ROM   Impression & Recommendations:  Problem # 1:  BURSITIS (ICD-727.3)  Complete Medication List: 1)  Adult Aspirin Low Strength 81 Mg Chew (Aspirin) .... Take 1 tablet by mouth once a day 2)  Lisinopril 20 Mg Tabs (Lisinopril) .Marland Kitchen.. 1 tablet by mouth once a day 3)  Metamucil 48.57 %  Powd (Psyllium) .... Take 4)  Omeprazole 40 Mg Cpdr (Omeprazole) .Marland Kitchen.. 1 by mouth once daily 5)  Vitamin D 1000 Unit Tabs (Cholecalciferol) .Marland Kitchen.. 1 by mouth once daily 6)  Vitamin D 78295 Unit Caps (Ergocalciferol) .Marland Kitchen.. 1 by mouth once daily 7)  Diclofenac Sodium 50 Mg Tbec (Diclofenac sodium) .... Three times a day as needed pain 8)  Promethazine Hcl 25 Mg Tabs (Promethazine hcl) .Marland Kitchen.. 1 every 4 hours as needed nausea  Patient Instructions: 1)  Use Diclofenac, heat, Icy Hot rubs, rest.  2)  Please schedule a follow-up appointment as needed .  Prescriptions: DICLOFENAC SODIUM 50 MG  TBEC (DICLOFENAC SODIUM) three times a day as needed pain  #60 x 5   Entered and Authorized by:   Nelwyn Salisbury MD   Signed by:   Nelwyn Salisbury MD on 03/26/2010   Method used:   Electronically to        [facility]* (retail)       [address]       [place], [place]  [id number]       Ph: [phone]       Fax: [phone]   RxID:   [id number]

## 2010-12-30 NOTE — Consult Note (Signed)
Summary: [facility]  [facility]   Imported By: Maryln Gottron 11/12/2010 12:13:06  _____________________________________________________________________  External Attachment:    Type:   Image     Comment:   External Document

## 2010-12-30 NOTE — Assessment & Plan Note (Signed)
Summary: chills/weakness and dizziness/cjr   Vital Signs:  Patient profile:   77 year old female Weight:      179 pounds O2 Sat:      98 % Temp:     98.5 degrees F Pulse rate:   76 / minute BP sitting:   120 / 80  (left arm)  Vitals Entered By: Pura Spice, RN (September 14, 2010 11:28 AM) CC: no better chills weak dizzy. states feels like she did when she had mono   History of Present Illness: Here with continued symptoms of weakness, fatigue, and lightheadedness. This has been going on for about 10 days. We saw her one week ago when she had these same symptoms and also some sinusitis symptoms like sinus pressure and blowing green mucus from the nose. After taking Doxycycline these sinus symptoms have resolved, but the fatigue remains. No recent medication changes.   Allergies: 1)  Cefaclor (Cefaclor) 2)  Penicillin G Potassium (Penicillin G Potassium) 3)  Prevacid (Lansoprazole) 4)  Sulfamethoxazole (Sulfamethoxazole)  Past History:  Past Medical History: Reviewed history from 09/30/2009 and no changes required. GERD Melanoma Allergic rhinitis Hypertension, sees Dr. Jacinto Halim   Past Surgical History: Reviewed history from 01/04/2008 and no changes required. Lt Breast Biopsy (non cancerous) T&A Lt TKA Cholecystectomy  Review of Systems  The patient denies anorexia, fever, weight loss, weight gain, vision loss, decreased hearing, hoarseness, chest pain, syncope, dyspnea on exertion, peripheral edema, prolonged cough, headaches, hemoptysis, abdominal pain, melena, hematochezia, severe indigestion/heartburn, hematuria, incontinence, genital sores, muscle weakness, suspicious skin lesions, transient blindness, difficulty walking, depression, unusual weight change, abnormal bleeding, enlarged lymph nodes, angioedema, breast masses, and testicular masses.    Physical Exam  General:  Well-developed,well-nourished,in no acute distress; alert,appropriate and cooperative throughout  examination Head:  Normocephalic and atraumatic without obvious abnormalities. No apparent alopecia or balding. Eyes:  No corneal or conjunctival inflammation noted. EOMI. Perrla. Funduscopic exam benign, without hemorrhages, exudates or papilledema. Vision grossly normal. Ears:  External ear exam shows no significant lesions or deformities.  Otoscopic examination reveals clear canals, tympanic membranes are intact bilaterally without bulging, retraction, inflammation or discharge. Hearing is grossly normal bilaterally. Nose:  External nasal examination shows no deformity or inflammation. Nasal mucosa are pink and moist without lesions or exudates. Mouth:  Oral mucosa and oropharynx without lesions or exudates.  Teeth in good repair. Neck:  No deformities, masses, or tenderness noted. Lungs:  Normal respiratory effort, chest expands symmetrically. Lungs are clear to auscultation, no crackles or wheezes. Heart:  Normal rate and regular rhythm. S1 and S2 normal without gallop, murmur, click, rub or other extra sounds. Abdomen:  Bowel sounds positive,abdomen soft and non-tender without masses, organomegaly or hernias noted. Extremities:  No clubbing, cyanosis, edema, or deformity noted with normal full range of motion of all joints.   Neurologic:  alert & oriented X3, cranial nerves II-XII intact, strength normal in all extremities, and gait normal.   Skin:  Intact without suspicious lesions or rashes Cervical Nodes:  No lymphadenopathy noted   Impression & Recommendations:  Problem # 1:  WEAKNESS (ICD-780.79)  Orders: UA Dipstick w/o Micro (automated)  (81003) Venipuncture (14782) TLB-BMP (Basic Metabolic Panel-BMET) (80048-METABOL) TLB-CBC Platelet - w/Differential (85025-CBCD) TLB-Hepatic/Liver Function Pnl (80076-HEPATIC) TLB-TSH (Thyroid Stimulating Hormone) (84443-TSH) TLB-B12, Serum-Total ONLY (95621-H08)  Problem # 2:  SINUSITIS, ACUTE NOS (ICD-461.9)  Her updated medication list  for this problem includes:    Doxycycline Hyclate 100 Mg Caps (Doxycycline hyclate) .Marland Kitchen..Marland Kitchen Two times a day  Complete Medication List: 1)  Adult Aspirin Low Strength 81 Mg Chew (Aspirin) .... Take 1 tablet by mouth once a day 2)  Lisinopril 20 Mg Tabs (Lisinopril) .Marland Kitchen.. 1 tablet by mouth once a day 3)  Metamucil 48.57 % Powd (Psyllium) .... Take 4)  Omeprazole 40 Mg Cpdr (Omeprazole) .Marland Kitchen.. 1 by mouth once daily 5)  Vitamin D 1000 Unit Tabs (Cholecalciferol) .Marland Kitchen.. 1 by mouth once daily 6)  Vitamin D 04540 Unit Caps (Ergocalciferol) .Marland Kitchen.. 1 by mouth once daily 7)  Diclofenac Sodium 50 Mg Tbec (Diclofenac sodium) .... Three times a day as needed pain 8)  Promethazine Hcl 25 Mg Tabs (Promethazine hcl) .Marland Kitchen.. 1 every 4 hours as needed nausea 9)  Doxycycline Hyclate 100 Mg Caps (Doxycycline hyclate) .... Two times a day  Patient Instructions: 1)  It is not clear what may be causing her fatigue. We will get labs today to investigate this.    Orders Added: 1)  Est. Patient Level IV [98119] 2)  UA Dipstick w/o Micro (automated)  [81003] 3)  Venipuncture [36415] 4)  TLB-BMP (Basic Metabolic Panel-BMET) [80048-METABOL] 5)  TLB-CBC Platelet - w/Differential [85025-CBCD] 6)  TLB-Hepatic/Liver Function Pnl [80076-HEPATIC] 7)  TLB-TSH (Thyroid Stimulating Hormone) [84443-TSH] 8)  TLB-B12, Serum-Total ONLY [82607-B12]  Appended Document: Orders Update     Clinical Lists Changes  Orders: Added new Service order of Specimen Handling (14782) - Signed      Appended Document: chills/weakness and dizziness/cjr  Laboratory Results   Urine Tests    Routine Urinalysis   Color: yellow Appearance: Clear Glucose: negative   (Normal Range: Negative) Bilirubin: negative   (Normal Range: Negative) Ketone: negative   (Normal Range: Negative) Spec. Gravity: 1.010   (Normal Range: 1.003-1.035) Blood: negative   (Normal Range: Negative) pH: 7.0   (Normal Range: 5.0-8.0) Protein: negative   (Normal  Range: Negative) Urobilinogen: 0.2   (Normal Range: 0-1) Nitrite: negative   (Normal Range: Negative) Leukocyte Esterace: negative   (Normal Range: Negative)    Comments: Rita Ohara  September 14, 2010 3:10 PM

## 2010-12-30 NOTE — Letter (Signed)
Summary: Colonoscopy Letter  [facility]  [address] [place], [place] [address]   Phone: [phone]  Fax: [phone]      December 08, 2010 MRN: [id number]   [facility] [address] [place], [place]  [address]   Dear Ms. Welles,   According to your medical record, it is time for you to schedule a Colonoscopy. The American Cancer Society recommends this procedure as a method to detect early colon cancer. Patients with a family history of colon cancer, or a personal history of colon polyps or inflammatory bowel disease are at increased risk.  This letter has been generated based on the recommendations made at the time of your procedure. If you feel that in your particular situation this may no longer apply, please contact our office.  Please call our office at [phone] to schedule this appointment or to update your records at your earliest convenience.  Thank you for cooperating with [place] to provide you with the very best care possible.   Sincerely,  Judie Petit T. Russella Dar, M.D.  [facility] [phone]

## 2010-12-30 NOTE — Telephone Encounter (Signed)
Please call the [facility] about an appointment made yesterday.

## 2010-12-30 NOTE — Telephone Encounter (Signed)
Please call [facility].  This pt needs diagnostic mammogram and ultrasound if indicated. No BX ordered.

## 2010-12-30 NOTE — Progress Notes (Signed)
Summary: refill lisinopril  Phone Note From Pharmacy   Caller: [facility]* Call For: refill  Summary of Call: request for refill on lisinopril 20 mg qd Initial call taken by: Duard Brady LPN,  March 02, 2010 9:21 AM  Follow-up for Phone Call        call in #30 with 11 rf Follow-up by: Nelwyn Salisbury MD,  March 02, 2010 12:59 PM    Prescriptions: LISINOPRIL 20 MG TABS (LISINOPRIL) 1 tablet by mouth once a day  #30 x 11   Entered by:   Duard Brady LPN   Authorized by:   Nelwyn Salisbury MD   Signed by:   Duard Brady LPN on 16/08/9603   Method used:   Electronically to        [facility]* (retail)       [address]       [place], [place]  [id number]       Ph: [phone]       Fax: [phone]   RxID:   [id number]

## 2010-12-30 NOTE — Assessment & Plan Note (Signed)
Summary: severe pain in both shoulders and arms/cjr   Vital Signs:  Patient profile:   77 year old female Weight:      181 pounds Temp:     98.8 degrees F oral BP sitting:   110 / 68  (left arm) Cuff size:   regular  Vitals Entered By: Sid Falcon LPN (May 06, 1609 3:27 PM) CC: severe pain both upper extremities   History of Present Illness: Bilateral shoulder pain R > L not much improved since April.  Not much relief with diclofenac. Pain with lifiting.  no neck pain.  No injury. Achy quality pain severe at times radiating from shoulder to deltoid.  Worse  with reaching overhead and abduction.  Allergies: 1)  Cefaclor (Cefaclor) 2)  Penicillin G Potassium (Penicillin G Potassium) 3)  Prevacid (Lansoprazole) 4)  Sulfamethoxazole (Sulfamethoxazole)  Past History:  Past Medical History: Last updated: 09/30/2009 GERD Melanoma Allergic rhinitis Hypertension, sees Dr. Jacinto Halim  PMH reviewed for relevance  Review of Systems      See HPI  Physical Exam  General:  Well-developed,well-nourished,in no acute distress; alert,appropriate and cooperative throughout examination Neck:  No deformities, masses, or tenderness noted. Lungs:  Normal respiratory effort, chest expands symmetrically. Lungs are clear to auscultation, no crackles or wheezes. Heart:  normal rate and regular rhythm.   Extremities:  shoulders symmetric in appearance. Patient has pain with abduction against resistance and internal rotation. No a.c. joint tenderness. No bicipital tenderness. Neurologic:  full strength with testing deltoid and biceps muscles. Symmetric reflexes upper extremities   Impression & Recommendations:  Problem # 1:  ROTATOR CUFF SYNDROME (ICD-726.10)  patient unimproved with nonsteroidal medication. Reviewed risk and benefits of steroid injection and patient wished to proceed.  Prepped shoulders with Betadine and using sterile technique injected 2 cc of plain 1 %Xylocaine and 40 mg of  Depo-Medrol using posterior lateral approach. Patient tolerated well. Consider x-rays of shoulder and possible physical therapy if not improving  Orders: Joint Aspirate / Injection, Large (20610) Depo- Medrol 40mg  (J1030)  Complete Medication List: 1)  Adult Aspirin Low Strength 81 Mg Chew (Aspirin) .... Take 1 tablet by mouth once a day 2)  Lisinopril 20 Mg Tabs (Lisinopril) .Marland Kitchen.. 1 tablet by mouth once a day 3)  Metamucil 48.57 % Powd (Psyllium) .... Take 4)  Omeprazole 40 Mg Cpdr (Omeprazole) .Marland Kitchen.. 1 by mouth once daily 5)  Vitamin D 1000 Unit Tabs (Cholecalciferol) .Marland Kitchen.. 1 by mouth once daily 6)  Vitamin D 96045 Unit Caps (Ergocalciferol) .Marland Kitchen.. 1 by mouth once daily 7)  Diclofenac Sodium 50 Mg Tbec (Diclofenac sodium) .... Three times a day as needed pain 8)  Promethazine Hcl 25 Mg Tabs (Promethazine hcl) .Marland Kitchen.. 1 every 4 hours as needed nausea

## 2010-12-30 NOTE — Assessment & Plan Note (Signed)
Summary: fever/chest congestion/cough/nausea/chills/cjr   Vital Signs:  Patient profile:   77 year old female Temp:     98.5 degrees F oral BP sitting:   118 / 82  (left arm) Cuff size:   regular  Vitals Entered By: Alfred Levins, CMA (January 19, 2010 11:40 AM) CC: cough, congestion, pt no better   History of Present Illness: Here for slowly worsening chest congestion and a cough productive of yellow sputum. No fever. She has had this for about 10 days.   Allergies: 1)  Cefaclor (Cefaclor) 2)  Penicillin G Potassium (Penicillin G Potassium) 3)  Prevacid (Lansoprazole) 4)  Sulfamethoxazole (Sulfamethoxazole)  Past History:  Past Medical History: Reviewed history from 09/30/2009 and no changes required. GERD Melanoma Allergic rhinitis Hypertension, sees Dr. Jacinto Halim   Review of Systems  The patient denies anorexia, fever, weight loss, weight gain, vision loss, decreased hearing, hoarseness, chest pain, syncope, dyspnea on exertion, peripheral edema, headaches, hemoptysis, abdominal pain, melena, hematochezia, severe indigestion/heartburn, hematuria, incontinence, genital sores, muscle weakness, suspicious skin lesions, transient blindness, difficulty walking, depression, unusual weight change, abnormal bleeding, enlarged lymph nodes, angioedema, breast masses, and testicular masses.    Physical Exam  General:  Well-developed,well-nourished,in no acute distress; alert,appropriate and cooperative throughout examination Head:  Normocephalic and atraumatic without obvious abnormalities. No apparent alopecia or balding. Eyes:  No corneal or conjunctival inflammation noted. EOMI. Perrla. Funduscopic exam benign, without hemorrhages, exudates or papilledema. Vision grossly normal. Ears:  External ear exam shows no significant lesions or deformities.  Otoscopic examination reveals clear canals, tympanic membranes are intact bilaterally without bulging, retraction, inflammation or  discharge. Hearing is grossly normal bilaterally. Nose:  External nasal examination shows no deformity or inflammation. Nasal mucosa are pink and moist without lesions or exudates. Mouth:  Oral mucosa and oropharynx without lesions or exudates.  Teeth in good repair. Neck:  No deformities, masses, or tenderness noted. Lungs:  Normal respiratory effort, chest expands symmetrically. Lungs are clear to auscultation, no crackles or wheezes.   Impression & Recommendations:  Problem # 1:  ACUTE BRONCHITIS (ICD-466.0)  Her updated medication list for this problem includes:    Biaxin 500 Mg Tabs (Clarithromycin) .Marland Kitchen..Marland Kitchen Two times a day  Complete Medication List: 1)  Adult Aspirin Low Strength 81 Mg Chew (Aspirin) .... Take 1 tablet by mouth once a day 2)  Lisinopril 20 Mg Tabs (Lisinopril) .Marland Kitchen.. 1 tablet by mouth once a day 3)  Metamucil 48.57 % Powd (Psyllium) .... Take 4)  Omeprazole 40 Mg Cpdr (Omeprazole) .Marland Kitchen.. 1 by mouth once daily 5)  Vitamin D 1000 Unit Tabs (Cholecalciferol) .Marland Kitchen.. 1 by mouth once daily 6)  Vitamin D [address] Unit Caps (Ergocalciferol) .Marland Kitchen.. 1 by mouth once daily 7)  Diclofenac Sodium 50 Mg Tbec (Diclofenac sodium) .... Three times a day as needed pain 8)  Promethazine Hcl 25 Mg Tabs (Promethazine hcl) .Marland Kitchen.. 1 every 4 hours as needed nausea 9)  Biaxin 500 Mg Tabs (Clarithromycin) .... Two times a day  Patient Instructions: 1)  Please schedule a follow-up appointment as needed .  Prescriptions: BIAXIN 500 MG TABS (CLARITHROMYCIN) two times a day  #20 x 0   Entered and Authorized by:   Nelwyn Salisbury MD   Signed by:   Nelwyn Salisbury MD on 01/19/2010   Method used:   Electronically to        [facility]* (retail)       [address]       [place], [place]  [id number]       Ph: [phone]       Fax: [phone]   RxID:   [id number]

## 2010-12-30 NOTE — Miscellaneous (Signed)
Summary: flu inj [facility]   Clinical Lists Changes  Observations: Added new observation of FLU VAX: Historical (08/16/2010 12:45)      Immunization History:  Influenza Immunization History:    Influenza:  historical (08/16/2010) given at [facility]......gh .rn........Marland Kitchen

## 2010-12-30 NOTE — Assessment & Plan Note (Signed)
Summary: ?sinus inf/njr   Vital Signs:  Patient profile:   77 year old female Weight:      186 pounds Temp:     98.0 degrees F oral Pulse rate:   73 / minute BP sitting:   134 / 76  (left arm) Cuff size:   large  Vitals Entered By: Alfred Levins, CMA (December 17, 2009 11:46 AM) CC: sinus inf?   History of Present Illness: Here for one week of sinus pressure, PND, St, and a dry cough. No fever.   Allergies: 1)  Cefaclor (Cefaclor) 2)  Penicillin G Potassium (Penicillin G Potassium) 3)  Prevacid (Lansoprazole) 4)  Sulfamethoxazole (Sulfamethoxazole)  Past History:  Past Medical History: Reviewed history from 09/30/2009 and no changes required. GERD Melanoma Allergic rhinitis Hypertension, sees Dr. Jacinto Halim   Review of Systems  The patient denies anorexia, fever, weight loss, weight gain, vision loss, decreased hearing, hoarseness, chest pain, syncope, dyspnea on exertion, peripheral edema, hemoptysis, abdominal pain, melena, hematochezia, severe indigestion/heartburn, hematuria, incontinence, genital sores, muscle weakness, suspicious skin lesions, transient blindness, difficulty walking, depression, unusual weight change, abnormal bleeding, enlarged lymph nodes, angioedema, breast masses, and testicular masses.    Physical Exam  General:  Well-developed,well-nourished,in no acute distress; alert,appropriate and cooperative throughout examination Head:  Normocephalic and atraumatic without obvious abnormalities. No apparent alopecia or balding. Eyes:  No corneal or conjunctival inflammation noted. EOMI. Perrla. Funduscopic exam benign, without hemorrhages, exudates or papilledema. Vision grossly normal. Ears:  External ear exam shows no significant lesions or deformities.  Otoscopic examination reveals clear canals, tympanic membranes are intact bilaterally without bulging, retraction, inflammation or discharge. Hearing is grossly normal bilaterally. Nose:  External nasal  examination shows no deformity or inflammation. Nasal mucosa are pink and moist without lesions or exudates. Mouth:  Oral mucosa and oropharynx without lesions or exudates.  Teeth in good repair. Neck:  No deformities, masses, or tenderness noted. Lungs:  Normal respiratory effort, chest expands symmetrically. Lungs are clear to auscultation, no crackles or wheezes.   Impression & Recommendations:  Problem # 1:  SINUSITIS, ACUTE NOS (ICD-461.9)  Her updated medication list for this problem includes:    Doxycycline Hyclate 100 Mg Caps (Doxycycline hyclate) .Marland Kitchen..Marland Kitchen Two times a day  Complete Medication List: 1)  Adult Aspirin Low Strength 81 Mg Chew (Aspirin) .... Take 1 tablet by mouth once a day 2)  Lisinopril 20 Mg Tabs (Lisinopril) .Marland Kitchen.. 1 tablet by mouth once a day 3)  Metamucil 48.57 % Powd (Psyllium) .... Take 4)  Omeprazole 40 Mg Cpdr (Omeprazole) .Marland Kitchen.. 1 by mouth once daily 5)  Vitamin D 1000 Unit Tabs (Cholecalciferol) .Marland Kitchen.. 1 by mouth once daily 6)  Vitamin D [address] Unit Caps (Ergocalciferol) .Marland Kitchen.. 1 by mouth once daily 7)  Diclofenac Sodium 50 Mg Tbec (Diclofenac sodium) .... Three times a day as needed pain 8)  Promethazine Hcl 25 Mg Tabs (Promethazine hcl) .Marland Kitchen.. 1 every 4 hours as needed nausea 9)  Doxycycline Hyclate 100 Mg Caps (Doxycycline hyclate) .... Two times a day  Patient Instructions: 1)  Please schedule a follow-up appointment as needed .  Prescriptions: DOXYCYCLINE HYCLATE 100 MG CAPS (DOXYCYCLINE HYCLATE) two times a day  #20 x 0   Entered and Authorized by:   Nelwyn Salisbury MD   Signed by:   Nelwyn Salisbury MD on 12/17/2009   Method used:   Electronically to        [facility]* (retail)       [address]  [address]       [place], [place]  [address]       Ph: [phone]       Fax: [phone]   RxID:   [id number]

## 2010-12-30 NOTE — Telephone Encounter (Signed)
Reorder sent

## 2010-12-30 NOTE — Assessment & Plan Note (Signed)
Summary: FATIGUE / WEAKNESS // RS   Vital Signs:  Patient profile:   77 year old female Weight:      181 pounds Pulse rate:   84 / minute Pulse rhythm:   regular BP sitting:   100 / 48  (left arm) Cuff size:   regular  Vitals Entered By: Raechel Ache, RN (May 24, 2010 4:24 PM) CC: C/o fatigue and feeling "washed out"   History of Present Illness: Here for generalized fatigue over the past 5 days. No other specific complaints today at all. No SOB or nausea or fever, no change in urinations or bowel habits. She received some steroid shots recently for bilateral shoulder pains, and these helped a lot. She still has some mild aches and stiffness in the shoulders. No hip pains.   Allergies: 1)  Cefaclor (Cefaclor) 2)  Penicillin G Potassium (Penicillin G Potassium) 3)  Prevacid (Lansoprazole) 4)  Sulfamethoxazole (Sulfamethoxazole)  Review of Systems  The patient denies anorexia, fever, weight loss, weight gain, vision loss, decreased hearing, hoarseness, chest pain, syncope, dyspnea on exertion, peripheral edema, prolonged cough, headaches, hemoptysis, abdominal pain, melena, hematochezia, severe indigestion/heartburn, hematuria, incontinence, genital sores, muscle weakness, suspicious skin lesions, transient blindness, difficulty walking, depression, unusual weight change, abnormal bleeding, enlarged lymph nodes, angioedema, breast masses, and testicular masses.    Physical Exam  General:  Well-developed,well-nourished,in no acute distress; alert,appropriate and cooperative throughout examination Neck:  No deformities, masses, or tenderness noted. Lungs:  Normal respiratory effort, chest expands symmetrically. Lungs are clear to auscultation, no crackles or wheezes. Heart:  Normal rate and regular rhythm. S1 and S2 normal without gallop, murmur, click, rub or other extra sounds. Abdomen:  Bowel sounds positive,abdomen soft and non-tender without masses, organomegaly or hernias  noted.   Impression & Recommendations:  Problem # 1:  WEAKNESS (ICD-780.79)  Orders: UA Dipstick w/o Micro (automated)  (81003) Venipuncture ([address]) TLB-BMP (Basic Metabolic Panel-BMET) (80048-METABOL) TLB-CBC Platelet - w/Differential (85025-CBCD) TLB-Hepatic/Liver Function Pnl (80076-HEPATIC) TLB-TSH (Thyroid Stimulating Hormone) (84443-TSH) TLB-Sedimentation Rate (ESR) (85652-ESR)  Complete Medication List: 1)  Adult Aspirin Low Strength 81 Mg Chew (Aspirin) .... Take 1 tablet by mouth once a day 2)  Lisinopril 20 Mg Tabs (Lisinopril) .Marland Kitchen.. 1 tablet by mouth once a day 3)  Metamucil 48.57 % Powd (Psyllium) .... Take 4)  Omeprazole 40 Mg Cpdr (Omeprazole) .Marland Kitchen.. 1 by mouth once daily 5)  Vitamin D 1000 Unit Tabs (Cholecalciferol) .Marland Kitchen.. 1 by mouth once daily 6)  Vitamin D 60454 Unit Caps (Ergocalciferol) .Marland Kitchen.. 1 by mouth once daily 7)  Diclofenac Sodium 50 Mg Tbec (Diclofenac sodium) .... Three times a day as needed pain 8)  Promethazine Hcl 25 Mg Tabs (Promethazine hcl) .Marland Kitchen.. 1 every 4 hours as needed nausea  Patient Instructions: 1)  The etiology of this is unclear, although an entity like polymyalgia rheumatica comes to mind. We will get screening labs today including a ESR, and go from there.   Laboratory Results   Urine Tests    Routine Urinalysis   Color: yellow Appearance: Clear Glucose: negative   (Normal Range: Negative) Bilirubin: negative   (Normal Range: Negative) Ketone: negative   (Normal Range: Negative) Spec. Gravity: <1.005   (Normal Range: 1.003-1.035) Blood: negative   (Normal Range: Negative) pH: 5.5   (Normal Range: 5.0-8.0) Protein: negative   (Normal Range: Negative) Urobilinogen: 0.2   (Normal Range: 0-1) Nitrite: negative   (Normal Range: Negative) Leukocyte Esterace: negative   (Normal Range: Negative)    Comments: Pryor Curia  Thrasher  May 24, 2010 5:10 PM

## 2010-12-30 NOTE — Telephone Encounter (Signed)
Not dr todd's patient. [facility] needs dignostic mammogram. Order read bx. With location of breast mass

## 2010-12-30 NOTE — Assessment & Plan Note (Signed)
Summary: ? sinuses//ccm   Vital Signs:  Patient profile:   77 year old female Weight:      182 pounds Temp:     98.6 degrees F oral BP sitting:   122 / 60  (left arm) Cuff size:   regular  Vitals Entered By: Raechel Ache, RN (July 19, 2010 2:11 PM) CC: C/o sore throat, sinus cong, and tired.   History of Present Illness: Here for one week of sinus pressure, PND, and ST. No fever or cough.   Allergies: 1)  Cefaclor (Cefaclor) 2)  Penicillin G Potassium (Penicillin G Potassium) 3)  Prevacid (Lansoprazole) 4)  Sulfamethoxazole (Sulfamethoxazole)  Past History:  Past Medical History: Reviewed history from 09/30/2009 and no changes required. GERD Melanoma Allergic rhinitis Hypertension, sees Dr. Jacinto Halim   Review of Systems  The patient denies anorexia, fever, weight loss, weight gain, vision loss, decreased hearing, hoarseness, chest pain, syncope, dyspnea on exertion, peripheral edema, prolonged cough, hemoptysis, abdominal pain, melena, hematochezia, severe indigestion/heartburn, hematuria, incontinence, genital sores, muscle weakness, suspicious skin lesions, transient blindness, difficulty walking, depression, unusual weight change, abnormal bleeding, enlarged lymph nodes, angioedema, breast masses, and testicular masses.    Physical Exam  General:  Well-developed,well-nourished,in no acute distress; alert,appropriate and cooperative throughout examination Head:  Normocephalic and atraumatic without obvious abnormalities. No apparent alopecia or balding. Eyes:  No corneal or conjunctival inflammation noted. EOMI. Perrla. Funduscopic exam benign, without hemorrhages, exudates or papilledema. Vision grossly normal. Ears:  External ear exam shows no significant lesions or deformities.  Otoscopic examination reveals clear canals, tympanic membranes are intact bilaterally without bulging, retraction, inflammation or discharge. Hearing is grossly normal bilaterally. Nose:   External nasal examination shows no deformity or inflammation. Nasal mucosa are pink and moist without lesions or exudates. Mouth:  Oral mucosa and oropharynx without lesions or exudates.  Teeth in good repair. Neck:  No deformities, masses, or tenderness noted. Lungs:  Normal respiratory effort, chest expands symmetrically. Lungs are clear to auscultation, no crackles or wheezes.   Impression & Recommendations:  Problem # 1:  SINUSITIS, ACUTE NOS (ICD-461.9)  Her updated medication list for this problem includes:    Zithromax Z-pak 250 Mg Tabs (Azithromycin) .Marland Kitchen... As directed  Complete Medication List: 1)  Adult Aspirin Low Strength 81 Mg Chew (Aspirin) .... Take 1 tablet by mouth once a day 2)  Lisinopril 20 Mg Tabs (Lisinopril) .Marland Kitchen.. 1 tablet by mouth once a day 3)  Metamucil 48.57 % Powd (Psyllium) .... Take 4)  Omeprazole 40 Mg Cpdr (Omeprazole) .Marland Kitchen.. 1 by mouth once daily 5)  Vitamin D 1000 Unit Tabs (Cholecalciferol) .Marland Kitchen.. 1 by mouth once daily 6)  Vitamin D 69629 Unit Caps (Ergocalciferol) .Marland Kitchen.. 1 by mouth once daily 7)  Diclofenac Sodium 50 Mg Tbec (Diclofenac sodium) .... Three times a day as needed pain 8)  Promethazine Hcl 25 Mg Tabs (Promethazine hcl) .Marland Kitchen.. 1 every 4 hours as needed nausea 9)  Zithromax Z-pak 250 Mg Tabs (Azithromycin) .... As directed  Patient Instructions: 1)  Please schedule a follow-up appointment as needed .  Prescriptions: ZITHROMAX Z-PAK 250 MG TABS (AZITHROMYCIN) as directed  #1 x 0   Entered and Authorized by:   Nelwyn Salisbury MD   Signed by:   Nelwyn Salisbury MD on 07/19/2010   Method used:   Electronically to        [facility]* (retail)       [address]       [place],  [place]  [id number]       Ph: [phone]       Fax: [phone]   RxID:   [id number]

## 2010-12-30 NOTE — Miscellaneous (Signed)
Summary: Flu Shot/[facility]  Flu Shot/[facility]   Imported By: Maryln Gottron 08/18/2010 11:08:41  _____________________________________________________________________  External Attachment:    Type:   Image     Comment:   External Document

## 2011-01-05 ENCOUNTER — Ambulatory Visit (INDEPENDENT_AMBULATORY_CARE_PROVIDER_SITE_OTHER): Payer: Medicare Other | Admitting: Family Medicine

## 2011-01-05 ENCOUNTER — Encounter: Payer: Self-pay | Admitting: Family Medicine

## 2011-01-05 ENCOUNTER — Telehealth: Payer: Self-pay | Admitting: Family Medicine

## 2011-01-05 VITALS — BP 116/74 | HR 82 | Temp 98.3°F

## 2011-01-05 DIAGNOSIS — M545 Low back pain: Secondary | ICD-10-CM

## 2011-01-05 NOTE — Telephone Encounter (Signed)
Bring in at 2 pm

## 2011-01-05 NOTE — Progress Notes (Signed)
  Subjective:    Patient ID: April [name], female    DOB: [birth year], 77 y.o.   MRN: [id number]  HPI Here for 3 days of lower back pain. She thinks it is related to moving furniture around in her home last weekend for a party. No urinary or bowel changes. She had a temp of 99 degrees this am, but it has been gone ever since.   Review of Systems  Constitutional: Negative.   Respiratory: Negative.   Cardiovascular: Negative.   Gastrointestinal: Negative.   Genitourinary: Negative.   Musculoskeletal: Positive for back pain.       Objective:   Physical Exam  Constitutional: She appears well-developed and well-nourished. No distress.  Pulmonary/Chest: Effort normal and breath sounds normal. No respiratory distress. She has no wheezes. She has no rales. She exhibits no tenderness.  Abdominal: Soft. Bowel sounds are normal. She exhibits no distension and no mass. There is no tenderness. There is no rebound and no guarding.  Musculoskeletal:       Tender over the lower back with spasm and reduced ROM           Assessment & Plan:  Low back pain. Rest , heat. Try Motrin 800 mg tid prn .

## 2011-01-05 NOTE — Telephone Encounter (Signed)
I called pt and she has been sch for today at 2pm as noted.

## 2011-01-05 NOTE — Telephone Encounter (Signed)
Pt called and is running fever and has low back pain and chills. Pt is req a work in appt for today 01/05/11 to see Dr Clent Ridges. Pls call.

## 2011-01-31 ENCOUNTER — Encounter: Payer: Self-pay | Admitting: Family Medicine

## 2011-01-31 ENCOUNTER — Ambulatory Visit (INDEPENDENT_AMBULATORY_CARE_PROVIDER_SITE_OTHER): Payer: Medicare Other | Admitting: Family Medicine

## 2011-01-31 VITALS — BP 120/70 | Temp 98.2°F | Ht 61.5 in | Wt 187.0 lb

## 2011-01-31 DIAGNOSIS — R05 Cough: Secondary | ICD-10-CM

## 2011-01-31 MED ORDER — DOXYCYCLINE HYCLATE 100 MG PO CAPS: 100.0000 mg | ORAL_CAPSULE | Freq: Two times a day (BID) | ORAL | Status: AC

## 2011-01-31 NOTE — Patient Instructions (Signed)
Acute Bronchitis You have acute bronchitis. This means you have a chest cold. The airways in your lungs are inflamed (red and sore). Acute means it is sudden onset. Bronchitis is most often caused by a virus. In smokers, people with chronic lung problems, and elderly patients, treatment with antibiotics for bacterial infection may be needed. Exposure to cigarette smoke or irritating chemicals will make bronchitis worse. Allergies and asthma can also make bronchitis worse. Repeated episodes of bronchitis may cause long standing lung problems. Acute bronchitis is usually treated with rest, fluids, and medicines for relief of fever or cough. Bronchodilator medicines from metered inhalers or a nebulizer may be used to help open up the small airways. This reduces shortness of breath and helps control cough. Antibiotics can be prescribed if you are more seriously ill or at risk. A cool air vaporizer may help thin bronchial secretions and make it easier to clear your chest. Increased fluids may also help. You must avoid smoking, even second hand exposure. If you are a cigarette smoker, consider using nicotine gum or skin patches to help control withdrawal symptoms. Recovery from bronchitis is often slow, but you should start feeling better after 2-3 days. Cough from bronchitis frequently lasts for 3-4 weeks.  SEEK IMMEDIATE MEDICAL CARE IF YOU DEVELOP:  Increased fever, chills, or chest pain.   Severe shortness of breath or bloody sputum.   Dehydration, fainting, repeated vomiting, severe headache.   No improvement after one week of proper treatment.  MAKE SURE YOU:   Understand these instructions.   Will watch your condition.   Will get help right away if you are not doing well or get worse.  Document Released: 12/22/2004 Document Re-Released: 10/27/2008 [employer] Patient Information 2011 [employer]. 

## 2011-01-31 NOTE — Progress Notes (Signed)
  Subjective:    Patient ID: April [name], female    DOB: October 20, 1929, 77 y.o.   MRN: [id number]  HPI  Patient seen with onset of illness over one week ago. Husband with similar symptoms. Initially sore throat and nasal congestion followed by cough. Cough is mostly dry but occasionally productive of yellow sputum. No fever. Denies any nausea, vomiting, or diarrhea. Multiple drug allergies including sulfa , penicillin , and cephalosporins. Patient is a nonsmoker. She states she has difficulty clearing bronchial infections   Review of Systems     Objective:   Physical Exam  patient is alert in no distress. She is afebrile  Oropharynx is moist and clear Ear drums are normal Neck is supple no adenopathy Chest no wheezing rales or rhonchi Heart regular rhythm and rate       Assessment & Plan:   cough probably secondary to acute bronchitis. Plenty of fluids. Doxycycline 100 mg twice a day for 10 days and followup with primary if no better in one week

## 2011-02-10 ENCOUNTER — Other Ambulatory Visit: Payer: Self-pay | Admitting: Internal Medicine

## 2011-02-14 ENCOUNTER — Other Ambulatory Visit: Payer: Self-pay | Admitting: Diagnostic Radiology

## 2011-02-14 ENCOUNTER — Other Ambulatory Visit: Payer: Self-pay | Admitting: Family Medicine

## 2011-02-14 ENCOUNTER — Ambulatory Visit
Admission: RE | Admit: 2011-02-14 | Discharge: 2011-02-14 | Disposition: A | Discharge status: Home / Self Care | Payer: Medicare Other | Source: Ambulatory Visit | Attending: Family Medicine | Admitting: Family Medicine

## 2011-02-14 DIAGNOSIS — N6320 Unspecified lump in the left breast, unspecified quadrant: Secondary | ICD-10-CM

## 2011-02-15 ENCOUNTER — Other Ambulatory Visit: Payer: Self-pay | Admitting: Family Medicine

## 2011-02-15 ENCOUNTER — Other Ambulatory Visit: Payer: Self-pay | Admitting: Internal Medicine

## 2011-02-15 DIAGNOSIS — C50912 Malignant neoplasm of unspecified site of left female breast: Secondary | ICD-10-CM

## 2011-02-17 ENCOUNTER — Other Ambulatory Visit: Payer: Self-pay | Admitting: *Deleted

## 2011-02-17 NOTE — Telephone Encounter (Signed)
Opened in error

## 2011-02-18 ENCOUNTER — Other Ambulatory Visit: Payer: Medicare Other

## 2011-02-21 ENCOUNTER — Ambulatory Visit
Admission: RE | Admit: 2011-02-21 | Discharge: 2011-02-21 | Disposition: A | Discharge status: Home / Self Care | Payer: Medicare Other | Source: Ambulatory Visit | Attending: Family Medicine | Admitting: Family Medicine

## 2011-02-21 DIAGNOSIS — C50912 Malignant neoplasm of unspecified site of left female breast: Secondary | ICD-10-CM

## 2011-02-21 MED ORDER — GADOBENATE DIMEGLUMINE 529 MG/ML IV SOLN
15.0000 mL | Freq: Once | INTRAVENOUS | Status: AC | PRN
  Administered 2011-02-21: 15 mL via INTRAVENOUS

## 2011-02-22 ENCOUNTER — Other Ambulatory Visit: Payer: Self-pay | Admitting: Family Medicine

## 2011-02-22 DIAGNOSIS — R928 Other abnormal and inconclusive findings on diagnostic imaging of breast: Secondary | ICD-10-CM

## 2011-02-23 ENCOUNTER — Other Ambulatory Visit: Payer: Self-pay | Admitting: Oncology

## 2011-02-23 ENCOUNTER — Encounter (HOSPITAL_BASED_OUTPATIENT_CLINIC_OR_DEPARTMENT_OTHER): Payer: Medicare Other | Admitting: Oncology

## 2011-02-23 ENCOUNTER — Encounter: Payer: Self-pay | Admitting: Family Medicine

## 2011-02-23 DIAGNOSIS — C50919 Malignant neoplasm of unspecified site of unspecified female breast: Secondary | ICD-10-CM

## 2011-02-23 LAB — COMPREHENSIVE METABOLIC PANEL
ALT: 17 U/L (ref 0–35)
Albumin: 3.8 g/dL (ref 3.5–5.2)
CO2: 26 mEq/L (ref 19–32)
Calcium: 9.7 mg/dL (ref 8.4–10.5)
Chloride: 108 mEq/L (ref 96–112)
Glucose, Bld: 99 mg/dL (ref 70–99)
Sodium: 140 mEq/L (ref 135–145)
Total Bilirubin: 0.5 mg/dL (ref 0.3–1.2)
Total Protein: 6.7 g/dL (ref 6.0–8.3)

## 2011-02-23 LAB — CBC WITH DIFFERENTIAL/PLATELET
BASO%: 0.8 % (ref 0.0–2.0)
Eosinophils Absolute: 0.1 10*3/uL (ref 0.0–0.5)
HCT: 35.7 % (ref 34.8–46.6)
LYMPH%: 25.6 % (ref 14.0–49.7)
MONO#: 0.6 10*3/uL (ref 0.1–0.9)
NEUT#: 5 10*3/uL (ref 1.5–6.5)
Platelets: 211 10*3/uL (ref 145–400)
RBC: 4.21 10*6/uL (ref 3.70–5.45)
WBC: 7.8 10*3/uL (ref 3.9–10.3)
lymph#: 2 10*3/uL (ref 0.9–3.3)

## 2011-02-23 LAB — CANCER ANTIGEN 27.29: CA 27.29: 17 U/mL (ref 0–39)

## 2011-02-25 ENCOUNTER — Ambulatory Visit
Admission: RE | Admit: 2011-02-25 | Discharge: 2011-02-25 | Disposition: A | Discharge status: Home / Self Care | Payer: Medicare Other | Source: Ambulatory Visit | Attending: Family Medicine | Admitting: Family Medicine

## 2011-02-25 DIAGNOSIS — R928 Other abnormal and inconclusive findings on diagnostic imaging of breast: Secondary | ICD-10-CM

## 2011-02-28 ENCOUNTER — Other Ambulatory Visit: Payer: Self-pay | Admitting: Family Medicine

## 2011-02-28 DIAGNOSIS — R928 Other abnormal and inconclusive findings on diagnostic imaging of breast: Secondary | ICD-10-CM

## 2011-03-01 ENCOUNTER — Encounter (HOSPITAL_COMMUNITY)
Admission: RE | Admit: 2011-03-01 | Discharge: 2011-03-01 | Disposition: A | Discharge status: Home / Self Care | Payer: Medicare Other | Source: Ambulatory Visit | Attending: Oncology | Admitting: Oncology

## 2011-03-01 ENCOUNTER — Encounter (HOSPITAL_COMMUNITY): Payer: Self-pay

## 2011-03-01 DIAGNOSIS — C50919 Malignant neoplasm of unspecified site of unspecified female breast: Secondary | ICD-10-CM

## 2011-03-01 DIAGNOSIS — K7689 Other specified diseases of liver: Secondary | ICD-10-CM | POA: Insufficient documentation

## 2011-03-01 DIAGNOSIS — J984 Other disorders of lung: Secondary | ICD-10-CM | POA: Insufficient documentation

## 2011-03-01 LAB — GLUCOSE, CAPILLARY: Glucose-Capillary: 94 mg/dL (ref 70–99)

## 2011-03-01 MED ORDER — FLUDEOXYGLUCOSE F - 18 (FDG) INJECTION
17.4000 | Freq: Once | INTRAVENOUS | Status: AC | PRN
  Administered 2011-03-01: 17.4 via INTRAVENOUS

## 2011-03-04 LAB — POCT URINALYSIS DIP (DEVICE)
Ketones, ur: NEGATIVE mg/dL
Nitrite: NEGATIVE
Protein, ur: NEGATIVE mg/dL
Urobilinogen, UA: 0.2 mg/dL (ref 0.0–1.0)
pH: 7 (ref 5.0–8.0)

## 2011-03-09 ENCOUNTER — Other Ambulatory Visit: Payer: Self-pay | Admitting: Diagnostic Radiology

## 2011-03-09 ENCOUNTER — Ambulatory Visit (INDEPENDENT_AMBULATORY_CARE_PROVIDER_SITE_OTHER): Payer: Medicare Other | Admitting: Family Medicine

## 2011-03-09 ENCOUNTER — Ambulatory Visit
Admission: RE | Admit: 2011-03-09 | Discharge: 2011-03-09 | Disposition: A | Discharge status: Home / Self Care | Payer: Medicare Other | Source: Ambulatory Visit | Attending: Family Medicine | Admitting: Family Medicine

## 2011-03-09 ENCOUNTER — Encounter: Payer: Self-pay | Admitting: Family Medicine

## 2011-03-09 VITALS — BP 110/64 | Temp 98.7°F | Ht 63.0 in | Wt 184.0 lb

## 2011-03-09 DIAGNOSIS — R928 Other abnormal and inconclusive findings on diagnostic imaging of breast: Secondary | ICD-10-CM

## 2011-03-09 DIAGNOSIS — L03119 Cellulitis of unspecified part of limb: Secondary | ICD-10-CM

## 2011-03-09 MED ORDER — DOXYCYCLINE HYCLATE 100 MG PO CAPS: 100.0000 mg | ORAL_CAPSULE | Freq: Two times a day (BID) | ORAL | Status: AC

## 2011-03-09 MED ORDER — GADOBENATE DIMEGLUMINE 529 MG/ML IV SOLN
15.0000 mL | Freq: Once | INTRAVENOUS | Status: AC | PRN
  Administered 2011-03-09: 15 mL via INTRAVENOUS

## 2011-03-09 MED ORDER — GADOBENATE DIMEGLUMINE 529 MG/ML IV SOLN: 15.0000 mL | Freq: Once | INTRAVENOUS | Status: DC | PRN
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
## 2011-03-09 NOTE — Patient Instructions (Signed)
Keep clean with soap and water. Follow up with Dr Clent Ridges for any persistent or worsening redness or drainage.

## 2011-03-25 ENCOUNTER — Ambulatory Visit (INDEPENDENT_AMBULATORY_CARE_PROVIDER_SITE_OTHER): Payer: Medicare Other | Admitting: Family Medicine

## 2011-03-25 ENCOUNTER — Encounter: Payer: Self-pay | Admitting: Family Medicine

## 2011-03-25 VITALS — BP 132/68 | HR 90 | Temp 98.5°F | Wt 181.0 lb

## 2011-03-25 DIAGNOSIS — J329 Chronic sinusitis, unspecified: Secondary | ICD-10-CM

## 2011-03-25 MED ORDER — DOXYCYCLINE HYCLATE 100 MG PO CAPS: 100.0000 mg | ORAL_CAPSULE | Freq: Two times a day (BID) | ORAL | Status: AC

## 2011-03-25 NOTE — Progress Notes (Signed)
  Subjective:    Patient ID: April [name], female    DOB: 1929-06-20, 77 y.o.   MRN: [id number]  HPI Here for 3 days of sinus pressure, PND, ST, and HA. No cough or fever. She is being treated for bilateral breast cancer.    Review of Systems  Constitutional: Negative.   HENT: Positive for congestion, postnasal drip and sinus pressure.   Eyes: Negative.   Respiratory: Negative.   Cardiovascular: Negative.        Objective:   Physical Exam  Constitutional: She appears well-developed and well-nourished.  HENT:  Right Ear: External ear normal.  Left Ear: External ear normal.  Nose: Nose normal.  Mouth/Throat: Oropharynx is clear and moist. No oropharyngeal exudate.  Eyes: Conjunctivae are normal. Pupils are equal, round, and reactive to light.  Neck: Normal range of motion. Neck supple.  Pulmonary/Chest: Effort normal and breath sounds normal.  Lymphadenopathy:    She has no cervical adenopathy.          Assessment & Plan:  Treat empirically. Rest, fluids

## 2011-04-01 ENCOUNTER — Encounter: Payer: Self-pay | Admitting: Family Medicine

## 2011-04-01 ENCOUNTER — Ambulatory Visit (INDEPENDENT_AMBULATORY_CARE_PROVIDER_SITE_OTHER): Payer: Medicare Other | Admitting: Family Medicine

## 2011-04-01 VITALS — BP 128/88 | HR 74 | Temp 98.4°F | Wt 185.0 lb

## 2011-04-01 DIAGNOSIS — R509 Fever, unspecified: Secondary | ICD-10-CM

## 2011-04-01 LAB — BASIC METABOLIC PANEL
CO2: 28 mEq/L (ref 19–32)
Calcium: 9.7 mg/dL (ref 8.4–10.5)
GFR: 47.85 mL/min — ABNORMAL LOW (ref >60.00)
Glucose, Bld: 89 mg/dL (ref 70–99)
Potassium: 4.8 mEq/L (ref 3.5–5.1)
Sodium: 139 mEq/L (ref 135–145)

## 2011-04-01 LAB — HEPATIC FUNCTION PANEL
ALT: 16 U/L (ref 0–35)
AST: 15 U/L (ref 0–37)
Bilirubin, Direct: 0 mg/dL (ref 0.0–0.3)
Total Bilirubin: 0.5 mg/dL (ref 0.3–1.2)

## 2011-04-01 LAB — POCT URINALYSIS DIPSTICK
Bilirubin, UA: NEGATIVE
Ketones, UA: NEGATIVE
Leukocytes, UA: NEGATIVE
Protein, UA: NEGATIVE
Spec Grav, UA: 1.01
pH, UA: 6

## 2011-04-01 LAB — CBC WITH DIFFERENTIAL/PLATELET
Basophils Absolute: 0.1 10*3/uL (ref 0.0–0.1)
Eosinophils Relative: 1.7 % (ref 0.0–5.0)
HCT: 35.4 % — ABNORMAL LOW (ref 36.0–46.0)
Lymphocytes Relative: 33.6 % (ref 12.0–46.0)
Lymphs Abs: 2.5 10*3/uL (ref 0.7–4.0)
Monocytes Relative: 9.2 % (ref 3.0–12.0)
Neutrophils Relative %: 54.6 % (ref 43.0–77.0)
Platelets: 206 10*3/uL (ref 150.0–400.0)
RDW: 14.8 % — ABNORMAL HIGH (ref 11.5–14.6)
WBC: 7.3 10*3/uL (ref 4.5–10.5)

## 2011-04-01 LAB — TSH: TSH: 0.4 u[IU]/mL (ref 0.35–5.50)

## 2011-04-01 NOTE — Progress Notes (Signed)
  Subjective:    Patient ID: April [name], female    DOB: 1929-05-12, 77 y.o.   MRN: [id number]  HPI Here with her husband for continued symptoms of fatigue, body aches, and a low grade fever. No urinary symptoms. No NVD. No ST or cough. Taking Doxycycline.    Review of Systems  Constitutional: Positive for fever and fatigue.  HENT: Negative.   Eyes: Negative.   Respiratory: Negative.   Cardiovascular: Negative.   Gastrointestinal: Negative.   Genitourinary: Negative.        Objective:   Physical Exam  Constitutional: She appears well-developed and well-nourished.  HENT:  Right Ear: External ear normal.  Left Ear: External ear normal.  Nose: Nose normal.  Mouth/Throat: Oropharynx is clear and moist. No oropharyngeal exudate.  Eyes: Conjunctivae are normal. Pupils are equal, round, and reactive to light.  Neck: Normal range of motion. Neck supple. No thyromegaly present.  Cardiovascular: Normal rate, regular rhythm, normal heart sounds and intact distal pulses.   Pulmonary/Chest: Effort normal and breath sounds normal.  Abdominal: Soft. Bowel sounds are normal. She exhibits no distension. There is no tenderness.  Lymphadenopathy:    She has no cervical adenopathy.          Assessment & Plan:  This now seems to be a viral illness, but we will get some labs today. Stay on Doxycycline.

## 2011-04-04 NOTE — Progress Notes (Signed)
Pt. Informed.

## 2011-04-06 ENCOUNTER — Other Ambulatory Visit: Payer: Self-pay | Admitting: Oncology

## 2011-04-06 ENCOUNTER — Encounter (HOSPITAL_BASED_OUTPATIENT_CLINIC_OR_DEPARTMENT_OTHER): Payer: Medicare Other | Admitting: Oncology

## 2011-04-06 DIAGNOSIS — C50919 Malignant neoplasm of unspecified site of unspecified female breast: Secondary | ICD-10-CM

## 2011-04-06 DIAGNOSIS — E559 Vitamin D deficiency, unspecified: Secondary | ICD-10-CM

## 2011-04-07 ENCOUNTER — Other Ambulatory Visit: Payer: Self-pay | Admitting: Oncology

## 2011-04-07 DIAGNOSIS — N6320 Unspecified lump in the left breast, unspecified quadrant: Secondary | ICD-10-CM

## 2011-04-07 LAB — VITAMIN D 25 HYDROXY (VIT D DEFICIENCY, FRACTURES): Vit D, 25-Hydroxy: 38 ng/mL (ref 30–89)

## 2011-04-08 ENCOUNTER — Other Ambulatory Visit: Payer: Medicare Other

## 2011-04-12 NOTE — Assessment & Plan Note (Signed)
[facility]                         GASTROENTEROLOGY OFFICE NOTE   RUMI, KOLODZIEJ                         MRN:          [id number]  DATE:01/29/2008                            DOB:          February 11, 1929    REFERRING PHYSICIAN:  Tera Mater. Clent Ridges, MD   April [name] returns for followup of chronic GERD. She states she has had  worsening problems with burning substernal pain since she ran out of  Nexium. She was tried on Prilosec 20 mg OTC q.a.m. and also b.i.d. Both  were ineffective in controlling her symptoms. Most recently she was  tried on pantoprazole 40 mg p.o. q.a.m. with better control of her  symptoms, but she prefers Nexium. She has no dysphagia, odynophagia,  melena, hematochezia, or change in bowel habits.   CURRENT MEDICATIONS:  Current medications listed on the chart, updated  and reviewed.   MEDICATION ALLERGIES:  1. CECLOR.  2. PENICILLIN.  3. SULFA DRUGS.  4. PREVACID.   EXAM:  GENERAL:  In no acute distress. Weight 191.8 pounds, blood  pressure is 118/50, pulse 68 and regular.  CHEST:  Clear to auscultation bilaterally.  CARDIAC: Regular rate and rhythm without murmurs appreciated.  ABDOMEN:  Soft and nontender with normal active bowel sounds. No  palpable organomegaly, masses, or hernias.   ASSESSMENT/PLAN:  1. Chronic gastroesophageal reflux. Resume Nexium 40 mg p.o. q.a.m.      taken 30 minutes before breakfast for long term usage. Continue      standard anti-reflux measures. She will call us if this is not      adequate for symptom control. We may increase to b.i.d. if her      symptoms to not come under better control. Return office visit 1      year and as needed.  2. Personal history of adenomatous colon polyps. Surveillance      colonoscopy will be recommended for January 2012.     Venita Lick. Russella Dar, MD, [facility]  Electronically Signed    MTS/MedQ  DD: 01/29/2008  DT: 01/29/2008  Job #: [id number]

## 2011-04-15 NOTE — Assessment & Plan Note (Signed)
[facility]                         GASTROENTEROLOGY OFFICE NOTE   CHASITTY, HEHL                         MRN:          [id number]  DATE:03/13/2007                            DOB:          Jan 14, 1929    This is a return office visit for GERD.  She states she has no symptoms  as long as she remains on Nexium.  She has not tried to discontinue the  medication in several years.  She has no dysphagia, abdominal pain, or  rectal bleeding.   CURRENT MEDICATIONS:  Listed on the chart updated and reviewed.   MEDICATION ALLERGIES:  CECLOR, PENICILLIN, SULFA, PREVACID.   PHYSICAL EXAMINATION:  GENERAL:  No acute distress.  VITAL SIGNS:  Weight 193 pounds.  Blood pressure 114/58.  Pulse 68 and  regular.  CHEST:  Clear to auscultation bilaterally.  CARDIAC:  Regular rate and rhythm without murmurs.  ABDOMEN:  Soft and nontender with normoactive bowel sounds.  No palpable  organomegaly or masses.   ASSESSMENT/PLAN:  Chronic gastroesophageal reflux disease.  Symptoms  under excellent control on Nexium 40 mg q.a.m. and antireflux measures.  Renew Nexium 40 mg p.o. q.a.m. for one year.  I advised her to try a  tapering schedule of Nexium to see if her reflux remains active without  medication therapy.  She will take Nexium every other day for two to  three weeks and if this is tolerated, she will go to every 3rd day for  two to three weeks, and if she does well, she may discontinue it.  If  her symptoms return, she may return to Nexium on a daily basis, or every  other day basis for adequate symptom control.  Return office visit in  one year if she remains on medication.     Venita Lick. Russella Dar, MD, [facility]  Electronically Signed    MTS/MedQ  DD: 03/14/2007  DT: 03/15/2007  Job #: [id number]

## 2011-04-15 NOTE — Op Note (Signed)
April [name], April [name]                             ACCOUNT NO.:  [id number]   MEDICAL RECORD NO.:  [id number]                   PATIENT TYPE:  AMB   LOCATION:  DAY                                  FACILITY:  [facility]   PHYSICIAN:  Timothy E. Earlene Plater, M.D.              DATE OF BIRTH:  1929/07/21   DATE OF PROCEDURE:  08/09/2002  DATE OF DISCHARGE:                                 OPERATIVE REPORT   PREOPERATIVE DIAGNOSES:  Multiple skin lesions, melanoma and basal cell.   POSTOPERATIVE DIAGNOSES:  Multiple skin lesions, melanoma and basal cell.   PROCEDURE:  Excision of multiple lesions.   SURGEON:  Timothy E. Earlene Plater, M.D.   ANESTHESIA:  Local standby.   INDICATIONS FOR PROCEDURE:  Ms. Scism has been seen and followed by Dr. Campbell Stall for a number of skin lesions recently seen in August. A diagnosis was  made and path reports are enclosed. There is a melanoma of the left back,  basal cell carcinomas of the right posterior shoulder, left chest and right  superior shoulder. All have been partially excised and cauterized. After  careful consultation in the office and scheduling around the patient's  vacation time, the surgery is scheduled. She understands that a full  excision is intended.   DESCRIPTION OF PROCEDURE:  Using the previous path report and the patient's  skin with scar tissue present, these areas were carefully marked. She was  identified, permit signed, evaluated by anesthesia and taken back to the  operating room. She was first placed in the prone  position, IV started and  sedation given. The lesion of the left back was located in the left upper  mid back. The scar present today was 2.4 x 1.8 cm. A full centimeter of  normal skin outside this irregular stellate scar was marked and then an  elliptical incision was designed horizontally to include all of the marked  area.  Local infiltration was used throughout with 0.5% Marcaine. A complete  elliptical incision is made,  the lesion was marked with a suture and sent  fresh to the lab. The resulting 8 cm wound was first carefully evaluated,  undermined, bleeding points cauterized, the wound was dry, it was then  closed in layers with 3-0 Vicryl, 2-0 nylon and 3-0 nylon.   The lesion of the right posterior shoulder could be accessed with the  patient prone, the area was prepped and draped, local anesthesia and that  basal cell lesion was simply excised and submitted as specimen #2 unmarked  and the resultant 4.5 cm wound was closed in layers with 3-0 Vicryl and 3-0  nylon.   The patient was then carefully turned, repositioned by anesthesia, all  monitors intact, the patient stable and the lesion of the left upper chest  actually over the skin of the upper breast on the left was prepped and  draped, anesthetized and  removed as a 2 cm excision and that wound was  closed with 3-0 nylon. That was specimen #3.   The lesion of the right superior shoulder could be accessed, the scar tissue  was marked, prepped, anesthetized and removed as a 2 cm excision and that  wound closed with 3-0 nylon. All wounds were dressed, counts were correct  and she was removed to the recovery room in good condition.   The path report will be copied to Dr. Campbell Stall. The patient will be seen  and followed in the office for sutures and wound care.   Careful wound care instructions were given to the patient and her husband  and Darvocet for pain.                                               Timothy E. Earlene Plater, M.D.    TED/MEDQ  D:  08/09/2002  T:  08/10/2002  Job:  [id number]   cc:   Hope M. Danella Deis, M.D.

## 2011-05-16 ENCOUNTER — Other Ambulatory Visit: Payer: Medicare Other

## 2011-05-23 ENCOUNTER — Encounter (HOSPITAL_BASED_OUTPATIENT_CLINIC_OR_DEPARTMENT_OTHER): Payer: Medicare Other | Admitting: Oncology

## 2011-05-23 ENCOUNTER — Other Ambulatory Visit: Payer: Self-pay | Admitting: Oncology

## 2011-05-23 DIAGNOSIS — E559 Vitamin D deficiency, unspecified: Secondary | ICD-10-CM

## 2011-05-23 DIAGNOSIS — C50919 Malignant neoplasm of unspecified site of unspecified female breast: Secondary | ICD-10-CM

## 2011-05-23 LAB — COMPREHENSIVE METABOLIC PANEL
ALT: 14 U/L (ref 0–35)
AST: 14 U/L (ref 0–37)
Albumin: 4.1 g/dL (ref 3.5–5.2)
BUN: 21 mg/dL (ref 6–23)
CO2: 24 mEq/L (ref 19–32)
Calcium: 9.9 mg/dL (ref 8.4–10.5)
Chloride: 106 mEq/L (ref 96–112)
Creatinine, Ser: 1.06 mg/dL (ref 0.50–1.10)
Potassium: 4.4 mEq/L (ref 3.5–5.3)

## 2011-05-23 LAB — CBC WITH DIFFERENTIAL/PLATELET
BASO%: 0.6 % (ref 0.0–2.0)
Eosinophils Absolute: 0.1 10*3/uL (ref 0.0–0.5)
MCHC: 33.6 g/dL (ref 31.5–36.0)
MONO#: 0.6 10*3/uL (ref 0.1–0.9)
NEUT#: 3.3 10*3/uL (ref 1.5–6.5)
Platelets: 194 10*3/uL (ref 145–400)
RBC: 4.21 10*6/uL (ref 3.70–5.45)
WBC: 6.1 10*3/uL (ref 3.9–10.3)
lymph#: 2.2 10*3/uL (ref 0.9–3.3)

## 2011-05-23 LAB — CANCER ANTIGEN 27.29: CA 27.29: 16 U/mL (ref 0–39)

## 2011-06-16 ENCOUNTER — Encounter (INDEPENDENT_AMBULATORY_CARE_PROVIDER_SITE_OTHER): Payer: Medicare Other | Admitting: General Surgery

## 2011-07-01 ENCOUNTER — Ambulatory Visit (INDEPENDENT_AMBULATORY_CARE_PROVIDER_SITE_OTHER): Payer: Medicare Other | Admitting: Family Medicine

## 2011-07-01 ENCOUNTER — Encounter: Payer: Self-pay | Admitting: Family Medicine

## 2011-07-01 VITALS — BP 132/68 | HR 73 | Temp 97.7°F | Wt 183.0 lb

## 2011-07-01 DIAGNOSIS — R5383 Other fatigue: Secondary | ICD-10-CM

## 2011-07-01 DIAGNOSIS — M545 Low back pain: Secondary | ICD-10-CM

## 2011-07-01 DIAGNOSIS — R350 Frequency of micturition: Secondary | ICD-10-CM

## 2011-07-01 DIAGNOSIS — C50919 Malignant neoplasm of unspecified site of unspecified female breast: Secondary | ICD-10-CM

## 2011-07-01 LAB — POCT URINALYSIS DIPSTICK
Ketones, UA: NEGATIVE
Protein, UA: NEGATIVE
Spec Grav, UA: 1.01
Urobilinogen, UA: 0.2
pH, UA: 6

## 2011-07-01 NOTE — Progress Notes (Signed)
  Subjective:    Patient ID: April [name], female    DOB: August 05, 1929, 77 y.o.   MRN: [id number]  HPI Here for generalized fatigue and mild low back pain for several weeks. No burning or fever. She thinks she may have a UTI. She was switched from Femara to Arimidex by Dr. Donnie Coffin about 5 weeks ago for breast cancer.    Review of Systems  Constitutional: Positive for fatigue.  Respiratory: Negative.   Cardiovascular: Negative.   Gastrointestinal: Negative.   Genitourinary: Negative.   Musculoskeletal: Positive for back pain.       Objective:   Physical Exam  Constitutional: She appears well-developed and well-nourished.  Cardiovascular: Normal rate, regular rhythm, normal heart sounds and intact distal pulses.   Pulmonary/Chest: Effort normal and breath sounds normal.  Abdominal: Soft. Bowel sounds are normal. She exhibits no distension and no mass. There is no tenderness. There is no rebound and no guarding.          Assessment & Plan:  This is not a UTI. I think she is having side effects from the Arimidex. She will speak to Dr. Donnie Coffin about this

## 2011-07-05 ENCOUNTER — Other Ambulatory Visit: Payer: Self-pay | Admitting: Family Medicine

## 2011-07-15 ENCOUNTER — Encounter (HOSPITAL_BASED_OUTPATIENT_CLINIC_OR_DEPARTMENT_OTHER): Payer: Medicare Other | Admitting: Oncology

## 2011-07-15 ENCOUNTER — Other Ambulatory Visit: Payer: Self-pay | Admitting: Oncology

## 2011-07-15 DIAGNOSIS — C50919 Malignant neoplasm of unspecified site of unspecified female breast: Secondary | ICD-10-CM

## 2011-07-15 DIAGNOSIS — E559 Vitamin D deficiency, unspecified: Secondary | ICD-10-CM

## 2011-07-15 LAB — CBC WITH DIFFERENTIAL/PLATELET
BASO%: 0.8 % (ref 0.0–2.0)
EOS%: 2.4 % (ref 0.0–7.0)
HCT: 35.6 % (ref 34.8–46.6)
LYMPH%: 28.1 % (ref 14.0–49.7)
MCH: 29 pg (ref 25.1–34.0)
MCHC: 33.9 g/dL (ref 31.5–36.0)
MCV: 85.4 fL (ref 79.5–101.0)
MONO%: 6.7 % (ref 0.0–14.0)
NEUT%: 62 % (ref 38.4–76.8)
Platelets: 208 10*3/uL (ref 145–400)
RBC: 4.17 10*6/uL (ref 3.70–5.45)

## 2011-07-16 LAB — COMPREHENSIVE METABOLIC PANEL
ALT: 13 U/L (ref 0–35)
AST: 15 U/L (ref 0–37)
Albumin: 4.3 g/dL (ref 3.5–5.2)
Alkaline Phosphatase: 70 U/L (ref 39–117)
BUN: 21 mg/dL (ref 6–23)
Calcium: 9.8 mg/dL (ref 8.4–10.5)
Chloride: 104 mEq/L (ref 96–112)
Potassium: 4.7 mEq/L (ref 3.5–5.3)
Sodium: 138 mEq/L (ref 135–145)

## 2011-07-19 ENCOUNTER — Encounter (INDEPENDENT_AMBULATORY_CARE_PROVIDER_SITE_OTHER): Payer: Self-pay | Admitting: General Surgery

## 2011-07-19 ENCOUNTER — Ambulatory Visit (INDEPENDENT_AMBULATORY_CARE_PROVIDER_SITE_OTHER): Payer: Medicare Other | Admitting: General Surgery

## 2011-07-19 DIAGNOSIS — C50419 Malignant neoplasm of upper-outer quadrant of unspecified female breast: Secondary | ICD-10-CM | POA: Insufficient documentation

## 2011-07-19 NOTE — Progress Notes (Signed)
Subjective:     Patient ID: April [name], female   DOB: 10-16-1929, 77 y.o.   MRN: [id number]  HPI This is a 77 year old female who was seen initially in the multidisciplinary breast clinic in March 2012. At that time she had a hard fixed 4 cm mass at the 3:00 position of her left breast it appeared to be adherent to her pectoralis muscle. She also had an MRI abnormality on the right side that was biopsied and was ductal carcinoma in situ. The locally advanced left breast cancer is 100% ER and PR positive. She has been on aromatase inhibitor which was letrozole. She has had some nonspecific symptoms of fatigue and dizziness. She was switched to a Arimidex at that time. She has continued to have these symptoms and she is due to see Dr. Donnie Coffin again next week. She currently is not on any antiestrogen therapy. She comes in today to discuss this as well as the possibility of proceeding with surgery.  Review of Systems     Objective:   Physical Exam  Pulmonary/Chest: Right breast exhibits no inverted nipple, no mass, no nipple discharge, no skin change and no tenderness. Left breast exhibits mass. Left breast exhibits no inverted nipple, no nipple discharge, no skin change and no tenderness. Breasts are symmetrical.         No axillary adenopathy bilaterally       Assessment:     Right breast DCIS Left breast locally advanced cancer    Plan:        I think the area on the left side has gotten smaller by my exam. I ordered an MRI today to followup this area. She is due to see Dr. Donnie Coffin on Friday and if she is not going to be placed back on her antiestrogen therapy right now due to some possible side effects then we can consider going to surgery. I will need an MRI prior to this to see what we can try to attempt to do in the operating room. I'm not entirely sure she is a candidate for a lumpectomy at this point. I will see her back in 2 weeks.

## 2011-07-22 ENCOUNTER — Encounter (HOSPITAL_BASED_OUTPATIENT_CLINIC_OR_DEPARTMENT_OTHER): Payer: Medicare Other | Admitting: Oncology

## 2011-07-22 DIAGNOSIS — E559 Vitamin D deficiency, unspecified: Secondary | ICD-10-CM

## 2011-07-22 DIAGNOSIS — C50919 Malignant neoplasm of unspecified site of unspecified female breast: Secondary | ICD-10-CM

## 2011-07-26 ENCOUNTER — Ambulatory Visit
Admission: RE | Admit: 2011-07-26 | Discharge: 2011-07-26 | Disposition: A | Discharge status: Home / Self Care | Payer: Medicare Other | Source: Ambulatory Visit | Attending: General Surgery | Admitting: General Surgery

## 2011-07-26 DIAGNOSIS — C50419 Malignant neoplasm of upper-outer quadrant of unspecified female breast: Secondary | ICD-10-CM

## 2011-07-26 MED ORDER — GADOBENATE DIMEGLUMINE 529 MG/ML IV SOLN
17.0000 mL | Freq: Once | INTRAVENOUS | Status: AC | PRN
  Administered 2011-07-26: 17 mL via INTRAVENOUS

## 2011-07-28 ENCOUNTER — Telehealth (INDEPENDENT_AMBULATORY_CARE_PROVIDER_SITE_OTHER): Payer: Self-pay

## 2011-07-28 NOTE — Telephone Encounter (Signed)
LMOM for pt to call me to let her know that Dr Dwain Sarna will speak with Dr Donnie Coffin next week when he gets back in town and discuss her at the breast ca meeting on Wednesday. We will talk to her next wk at her appt.April [name]

## 2011-08-03 ENCOUNTER — Ambulatory Visit (INDEPENDENT_AMBULATORY_CARE_PROVIDER_SITE_OTHER): Payer: Medicare Other | Admitting: General Surgery

## 2011-08-03 ENCOUNTER — Encounter (INDEPENDENT_AMBULATORY_CARE_PROVIDER_SITE_OTHER): Payer: Self-pay | Admitting: General Surgery

## 2011-08-03 ENCOUNTER — Other Ambulatory Visit (INDEPENDENT_AMBULATORY_CARE_PROVIDER_SITE_OTHER): Payer: Self-pay | Admitting: General Surgery

## 2011-08-03 VITALS — BP 136/58 | HR 72

## 2011-08-03 DIAGNOSIS — C50419 Malignant neoplasm of upper-outer quadrant of unspecified female breast: Secondary | ICD-10-CM

## 2011-08-03 DIAGNOSIS — C50919 Malignant neoplasm of unspecified site of unspecified female breast: Secondary | ICD-10-CM

## 2011-08-03 NOTE — Progress Notes (Signed)
Chief Complaint  Patient presents with  . Breast Cancer Long Term Follow Up    re br    HPI April [name] is a 77 y.o. female.   HPI This is a 77 year old female I initially met in March of 2012. This was at the multidisciplinary clinic. She presented in March of 2012 with a palpable left breast mass as well as a mammogram showing an irregular spiculated mass in the lateral left breast measuring 2.5 x 4.1 x 3.8 cm ultrasound showed an irregular hypoechoic mass as well as a small 10 mm node with a thickened cortex at that time. Ultrasound guided vacuum assisted core biopsy was performed showing invasive mammary carcinoma that appears to be ductal in origin it is ER-positive A1 100%, PR positive at 100%, proliferation index is 36% and HER-2/neu was negative. She also underwent attempted biopsy of the node where there was tissue insufficient for diagnosis. She also underwent an MRI showed a 2.7 x 2.6 x 2.9 cm left breast mass with the medial margin abutting the pectoralis muscle in the right breast there was also a 1.8 cm area of nodular enhancement. This underwent an a biopsy that showed ductal carcinoma in situ-appearing low grade. She was then begun on antiestrogen therapy in an attempt to shrink this area on the left to consider breast conservation therapy. Over this time she is a lot of trouble with the antiestrogen therapy and is currently off of that due to the side effects. I had seen her both recently and clinically this area on the left side and not really changed significantly. She underwent a repeat MRI that shows on the left side this area measured 2.3 x 3 x 2.1 cm. There is a slight increase on the right side of her enhancement now is 1.5 x 3 x 2.5 cm. She has been off her antiestrogen therapy at this point and now feels very well. We discussed her at multi-desire breast conference earlier today in conjunction with Dr. Donnie Coffin in we discussed the likely proceeding with surgery which would be a  mastectomy on the left side and either lumpectomy or mastectomy on the other side.    Past Medical History  Diagnosis Date  . HYPERTENSION 09/30/2009  . ALLERGIC RHINITIS 04/18/2007  . GERD 04/18/2007  . LOW BACK PAIN SYNDROME 07/13/2009  . ROTATOR CUFF SYNDROME 05/06/2010  . BURSITIS 01/04/2008  . COSTOCHONDRITIS 11/05/2007  . Melanoma   . Breast cancer 2012    sees Dr. Donnie Coffin, ER/PR positive     Past Surgical History  Procedure Date  . Cholecystectomy   . Tonsillectomy and adenoidectomy   . Breast surgery 1968    left bx - noncancerous  . Joint replacement     left knee  . Excision of back melanoma     Family History  Problem Relation Age of Onset  . Stroke Father   . Cancer Neg Hx     family - thyroid  . Alcohol abuse Sister     Social History History  Substance Use Topics  . Smoking status: Never Smoker   . Smokeless tobacco: Not on file  . Alcohol Use: Yes     socially only    Allergies  Allergen Reactions  . Cefaclor     REACTION: unspecified  . Lansoprazole     REACTION: unspecified  . Penicillins     REACTION: unspecified  . Sulfa Drugs Cross Reactors   . Sulfamethoxazole     REACTION: unspecified  Current Outpatient Prescriptions  Medication Sig Dispense Refill  . anastrozole (ARIMIDEX) 1 MG tablet Take 1 mg by mouth daily.        Marland Kitchen aspirin 81 MG tablet Take 81 mg by mouth daily.        . Cholecalciferol (VITAMIN D) 1000 UNITS capsule Take 1,000 Units by mouth daily.        . diclofenac (VOLTAREN) 50 MG EC tablet Take 50 mg by mouth 3 (three) times daily. Prn pain       . ergocalciferol (VITAMIN D2) 50000 UNITS capsule Take 50,000 Units by mouth once a week.        Marland Kitchen lisinopril (PRINIVIL,ZESTRIL) 20 MG tablet TAKE 1 TABLET ONCE DAILY.  30 tablet  3  . omeprazole (PRILOSEC) 40 MG capsule Take 40 mg by mouth daily.        . promethazine (PHENERGAN) 25 MG tablet Take 25 mg by mouth every 4 (four) hours as needed. nausea       . Psyllium (METAMUCIL)  48.57 % POWD Take by mouth daily. As directed         Review of Systems Review of Systems  Constitutional: Positive for fatigue (associated with antiestrogen meds) and unexpected weight change.  HENT: Positive for postnasal drip.   Eyes: Negative.   Respiratory: Positive for cough.   Cardiovascular: Positive for chest pain.  Gastrointestinal: Negative.   Genitourinary: Negative.   Musculoskeletal: Positive for arthralgias.  Skin: Negative.   Neurological: Negative.   Hematological: Negative.   Psychiatric/Behavioral: Negative.     Blood pressure 136/58, pulse 72.  Physical Exam Physical Exam  Constitutional: She appears well-developed and well-nourished.  HENT:  Head: Normocephalic and atraumatic.  Eyes: No scleral icterus.  Neck: Neck supple.  Cardiovascular: Normal rate, regular rhythm and normal heart sounds.  Exam reveals no gallop.   No murmur heard. Pulmonary/Chest: Breath sounds normal. No respiratory distress. She has no wheezes. She has no rales. She exhibits no tenderness. Right breast exhibits no inverted nipple, no mass, no nipple discharge, no skin change and no tenderness. Left breast exhibits mass and skin change. Left breast exhibits no inverted nipple, no nipple discharge and no tenderness. Breasts are symmetrical.    Abdominal: Soft. There is no hepatomegaly.  Lymphadenopathy:    She has no cervical adenopathy.    She has no axillary adenopathy.     Assessment    Right breast DCIS Locally advanced left breast cancer    Plan    We discussed again the staging and  pathophysiology of breast cancer. She is really unable to tolerate her antiestrogen therapy at this point. We discussed her multidisciplinary breast conference this morning and if she is unable to continue antiestrogen therapy then we should proceed with surgery. I am also concerned that the right side looks a little bit bigger on an MRI although I don't actually know what that means.  I  discussed with her that I think that the treatment of the left side given the size as well as the skin changes associated with that are going to be a mastectomy. We also discussed a sentinel lymph node biopsy with intraoperative pathology and possible axillary dissection if her nodes are positive in the operating room. I discussed the postoperative course associated with this and the drains. She may very well end up staying 2 postoperative nights in the hospital to her home situation. We discussed the risks including bleeding, infection, flap necrosis, wound dehiscence, but the fact that this  may be wrinkled afterwards as well as the area in her back may become more prominent. We discussed the possibility of radiation on that left side afterwards as well.  Right side would be amenable to breast conservation therapy at this point. We discussed the difference between a lumpectomy as well as a mastectomy in terms of local recurrence as well as in terms of survival. She certainly would appear to need radiation therapy on the right side if she were going to undergo lumpectomy and this may be difficult given her home situation. I did discuss with her also a 10-15% chance of needing to return to the operating room if her margins were positive. We discussed a mastectomy on that side as well she would like to proceed with attempting to go bilateral mastectomies and I will do a sentinel lymph node biopsy on the right side as well.  We again discussed the complications associated with the operation as well as her postoperative course and long-term complications. She seems to be amenable to this decision. I don't think she could be a little tolerate antiestrogen therapy and given the findings at this point she is very agreeable to undergoing a bilateral mastectomy. I did discuss with her possible referral to plastic surgery which he does not wish to undergo at this time.       Augustin Bun 08/03/2011, 2:13  PM

## 2011-08-11 ENCOUNTER — Other Ambulatory Visit (INDEPENDENT_AMBULATORY_CARE_PROVIDER_SITE_OTHER): Payer: Self-pay | Admitting: General Surgery

## 2011-08-11 ENCOUNTER — Ambulatory Visit (HOSPITAL_COMMUNITY)
Admission: RE | Admit: 2011-08-11 | Discharge: 2011-08-11 | Disposition: A | Discharge status: Home / Self Care | Payer: Medicare Other | Source: Ambulatory Visit | Attending: General Surgery | Admitting: General Surgery

## 2011-08-11 ENCOUNTER — Encounter (HOSPITAL_COMMUNITY)
Admission: RE | Admit: 2011-08-11 | Discharge: 2011-08-11 | Disposition: A | Discharge status: Home / Self Care | Payer: Medicare Other | Source: Ambulatory Visit | Attending: General Surgery | Admitting: General Surgery

## 2011-08-11 DIAGNOSIS — Z01810 Encounter for preprocedural cardiovascular examination: Secondary | ICD-10-CM | POA: Insufficient documentation

## 2011-08-11 DIAGNOSIS — C50919 Malignant neoplasm of unspecified site of unspecified female breast: Secondary | ICD-10-CM | POA: Insufficient documentation

## 2011-08-11 DIAGNOSIS — Z01812 Encounter for preprocedural laboratory examination: Secondary | ICD-10-CM | POA: Insufficient documentation

## 2011-08-11 DIAGNOSIS — Z01818 Encounter for other preprocedural examination: Secondary | ICD-10-CM | POA: Insufficient documentation

## 2011-08-11 LAB — DIFFERENTIAL
Basophils Relative: 1 % (ref 0–1)
Eosinophils Absolute: 0.2 10*3/uL (ref 0.0–0.7)
Lymphs Abs: 2.9 10*3/uL (ref 0.7–4.0)
Monocytes Relative: 9 % (ref 3–12)
Neutro Abs: 5.1 10*3/uL (ref 1.7–7.7)
Neutrophils Relative %: 57 % (ref 43–77)

## 2011-08-11 LAB — COMPREHENSIVE METABOLIC PANEL
ALT: 16 U/L (ref 0–35)
AST: 15 U/L (ref 0–37)
CO2: 29 mEq/L (ref 19–32)
Chloride: 101 mEq/L (ref 96–112)
GFR calc Af Amer: >60 mL/min (ref >60)
GFR calc non Af Amer: 51 mL/min — ABNORMAL LOW (ref >60)
Glucose, Bld: 77 mg/dL (ref 70–99)
Sodium: 137 mEq/L (ref 135–145)
Total Bilirubin: 0.2 mg/dL — ABNORMAL LOW (ref 0.3–1.2)

## 2011-08-11 LAB — CBC
Hemoglobin: 12.1 g/dL (ref 12.0–15.0)
MCH: 28.1 pg (ref 26.0–34.0)
MCV: 85.8 fL (ref 78.0–100.0)
Platelets: 238 10*3/uL (ref 150–400)
RBC: 4.3 MIL/uL (ref 3.87–5.11)
WBC: 9 10*3/uL (ref 4.0–10.5)

## 2011-08-22 LAB — POCT I-STAT, CHEM 8
BUN: 16
Calcium, Ion: 1.21
Chloride: 102

## 2011-08-22 LAB — CBC
Hemoglobin: 12.3
RBC: 4.21
RDW: 14.1
WBC: 13 — ABNORMAL HIGH

## 2011-08-22 LAB — URINE CULTURE

## 2011-08-22 LAB — DIFFERENTIAL
Basophils Absolute: 0
Lymphocytes Relative: 3 — ABNORMAL LOW
Lymphs Abs: 0.3 — ABNORMAL LOW
Monocytes Absolute: 0.8
Monocytes Relative: 6
Neutro Abs: 11.6 — ABNORMAL HIGH

## 2011-08-22 LAB — URINALYSIS, ROUTINE W REFLEX MICROSCOPIC
Bilirubin Urine: NEGATIVE
Ketones, ur: NEGATIVE
Nitrite: NEGATIVE
Specific Gravity, Urine: 1.019
Urobilinogen, UA: 0.2

## 2011-08-25 ENCOUNTER — Ambulatory Visit (HOSPITAL_COMMUNITY)
Admission: RE | Admit: 2011-08-25 | Discharge: 2011-08-27 | Disposition: A | Discharge status: Home / Self Care | Payer: Medicare Other | Source: Ambulatory Visit | Attending: General Surgery | Admitting: General Surgery

## 2011-08-25 ENCOUNTER — Other Ambulatory Visit (INDEPENDENT_AMBULATORY_CARE_PROVIDER_SITE_OTHER): Payer: Self-pay | Admitting: General Surgery

## 2011-08-25 ENCOUNTER — Inpatient Hospital Stay (HOSPITAL_COMMUNITY)
Admission: RE | Admit: 2011-08-25 | Discharge: 2011-08-25 | Disposition: A | Discharge status: Home / Self Care | Payer: Medicare Other | Source: Ambulatory Visit | Attending: General Surgery | Admitting: General Surgery

## 2011-08-25 DIAGNOSIS — C50919 Malignant neoplasm of unspecified site of unspecified female breast: Principal | ICD-10-CM | POA: Insufficient documentation

## 2011-08-25 DIAGNOSIS — Z01812 Encounter for preprocedural laboratory examination: Secondary | ICD-10-CM | POA: Insufficient documentation

## 2011-08-25 DIAGNOSIS — Z01818 Encounter for other preprocedural examination: Secondary | ICD-10-CM | POA: Insufficient documentation

## 2011-08-25 DIAGNOSIS — L821 Other seborrheic keratosis: Secondary | ICD-10-CM | POA: Insufficient documentation

## 2011-08-25 DIAGNOSIS — I10 Essential (primary) hypertension: Secondary | ICD-10-CM | POA: Insufficient documentation

## 2011-08-25 DIAGNOSIS — D0590 Unspecified type of carcinoma in situ of unspecified breast: Secondary | ICD-10-CM | POA: Insufficient documentation

## 2011-08-25 DIAGNOSIS — Z01810 Encounter for preprocedural cardiovascular examination: Secondary | ICD-10-CM | POA: Insufficient documentation

## 2011-08-25 HISTORY — PX: SENTINEL LYMPH NODE BIOPSY: SHX2392

## 2011-08-25 HISTORY — PX: MASTECTOMY: SHX3

## 2011-08-25 MED ORDER — TECHNETIUM TC 99M SULFUR COLLOID FILTERED: 1.0000 | Freq: Once | INTRAVENOUS | Status: AC | PRN

## 2011-08-25 NOTE — Op Note (Signed)
April [name], April [name]                  ACCOUNT NO.:  [id number]  MEDICAL RECORD NO.:  [id number]  LOCATION:  2899                         FACILITY:  [facility]  PHYSICIAN:  Juanetta Gosling, MDDATE OF BIRTH:  October 17, 1929  DATE OF PROCEDURE:  08/25/2011 DATE OF DISCHARGE:                              OPERATIVE REPORT   PREOPERATIVE DIAGNOSES: 1. Right breast ductal carcinoma in situ. 2. Left breast, clinical stage II invasive ductal carcinoma.  POSTOPERATIVE DIAGNOSES: 1. Right breast ductal carcinoma in situ. 2. Left breast, clinical stage II invasive ductal carcinoma.  PROCEDURES: 1. Right simple mastectomy. 2. Left simple mastectomy. 3. Right axillary sentinel node biopsy. 4. Left axillary sentinel node biopsy. 5. Bilateral injection of methylene blue dye for sentinel node     identification.  SURGEON:  Juanetta Gosling, [name]  ASSISTANTS: 1. Wilmon Arms. Tsuei, [name] 2. Abigail Miyamoto, [name]  ANESTHESIA:  General.  SUPERVISING ANESTHESIOLOGIST:  Sheldon Silvan, [name]  SPECIMENS: 1. Right breast marked short superior, long lateral. 2. Left breast marked short superior, long lateral. 3. Left axillary sentinel node x2 with counts of 150 and 125. 4. Right axillary sentinel node x2 with counts of 21, 75, and 21, 48.  DRAINS:  Two 19-French Blake drains to left side and one 19-French Blake drain on the right side.  ESTIMATED BLOOD LOSS:  50 mL.  COMPLICATIONS:  None.  DISPOSITION:  To recovery room in stable condition.  INDICATIONS:  April [name] is a 77 year old female who was initially diagnosed with a left breast cancer in March 2012 with a palpable left breast mass and an irregular spiculated mass that appeared to be stuck to her pectoralis muscle, measuring about 2.5 x 4.1 x 3.8 cm.  At that time, ultrasound-guided vacuum-assisted core biopsy showed an invasive mammary carcinoma that appeared to be ductal that was ER positive, PR positive.  She underwent an attempted biopsy of  a node at that time that was tissue insufficient for diagnosis.  She also had an MRI that showed the similar mass with the medial margin abutting the pectoralis muscle. In the right breast, there was a 1.8-cm area of nodule enhancement that ended up being biopsied and showed DCIS.  She was begun on antiestrogen therapy.  She wanted to consider doing a breast conservation therapy. She had had a lot of trouble with the antiestrogen therapy due to side effects and she was off those when I last saw her.  A repeat MRI showed that that area is about 2.3 x 2.2 x 2.1 cm and an increase in the right side of her enhancement this last time.  Her exam still showed a fairly sizable mass where the skin was puckered onto the mass and this appeared to be stuck to the pectoralis muscle still.  We discussed surgery and I recommended a mastectomy for the left side as I do not think that it would be possible to do breast conservation therapy at the current time. Due to that, she also requested that we do a mastectomy on the right side.  We discussed bilateral sentinel nodes at the same time as well with the possibility of doing an active dissection if the  left side was positive.  PROCEDURE:  After informed consent was obtained, the patient was taken to the operating room.  She was then administered technetium in a standard periareolar fashion to begin with.  She then was administered 1 g of intravenous cefazolin.  Sequential compression devices were placed on her lower extremities prior to induction with anesthesia.  She was then placed under general endotracheal anesthesia without complication. Her bilateral breasts and entire left arm were then prepped and draped in a standard sterile surgical fashion.  A surgical time-out was then performed.  I began on the left side, I made a large elliptical incision to encompass the skin overlying the breast mass laterally.  Flaps were made to the clavicle, sternum,  inframammary crease, and the latissimus laterally.  The breast was lifted off the pectoralis muscle.  In the region of the tumor, I did remove some of the pectoralis muscle as it was adherent to that as well.  I took this laterally and removed it without any difficulty.  This was then passed off the table.  I then used the NeoProbe to identify two small sentinel nodes that were very close to being on her axillary vein medially.  These were removed and passed off the table.  Touch prep was performed and these were negative. Hemostasis was obtained, I packed this with a sponge and I went to the right side.  I then made a similar elliptical incision on the right side and the flaps were created in a similar fashion.  The breast was removed in the same way as the left side.  This was also marked in a similar fashion.  I used the NeoProbe, I removed two sentinel nodes from this side with the counts as listed above.  Hemostasis was obtained on this side, irrigation was performed.  Dr. Magnus Ivan closed this side after placing a 19-French Blake drain with 3-0 Vicryl, 4-0 Monocryl, Steri- Strips and Dermabond.  I then returned to the left side.  I irrigated this, obtained hemostasis.  I placed two 19-French Blake drains on this side and then closed it with 3-0 Vicryl, 4-0 Monocryl, Dermabond, and Steri- Strips.  All drains were functional upon completion and secured with 2-0 nylon suture.  I then placed ABD pads and a breast binder overlying this.  She was extubated in the operating room and then was transferred to the recovery room in stable condition.     Juanetta Gosling, [name]     MCW/MEDQ  D:  08/25/2011  T:  08/25/2011  Job:  [id number]  cc:   April [name] April [name].  Electronically Signed by April [name] on 08/25/2011 05:02:32 PM

## 2011-08-26 LAB — CBC
HCT: 30.2 % — ABNORMAL LOW (ref 36.0–46.0)
Hemoglobin: 10.1 g/dL — ABNORMAL LOW (ref 12.0–15.0)
MCH: 28.5 pg (ref 26.0–34.0)
MCV: 85.1 fL (ref 78.0–100.0)
RBC: 3.55 MIL/uL — ABNORMAL LOW (ref 3.87–5.11)

## 2011-08-26 LAB — BASIC METABOLIC PANEL
CO2: 26 mEq/L (ref 19–32)
Calcium: 9 mg/dL (ref 8.4–10.5)
Creatinine, Ser: 0.85 mg/dL (ref 0.50–1.10)
Glucose, Bld: 97 mg/dL (ref 70–99)

## 2011-08-27 ENCOUNTER — Other Ambulatory Visit: Payer: Self-pay | Admitting: Family Medicine

## 2011-08-30 ENCOUNTER — Ambulatory Visit (INDEPENDENT_AMBULATORY_CARE_PROVIDER_SITE_OTHER): Payer: Medicare Other | Admitting: General Surgery

## 2011-08-30 ENCOUNTER — Encounter (INDEPENDENT_AMBULATORY_CARE_PROVIDER_SITE_OTHER): Payer: Self-pay | Admitting: General Surgery

## 2011-08-30 VITALS — BP 116/64 | HR 72 | Temp 97.8°F | Resp 14 | Ht 62.0 in | Wt 176.0 lb

## 2011-08-30 DIAGNOSIS — Z09 Encounter for follow-up examination after completed treatment for conditions other than malignant neoplasm: Secondary | ICD-10-CM

## 2011-08-30 NOTE — Progress Notes (Signed)
Subjective:     Patient ID: April [name], female   DOB: 17-Jan-1929, 77 y.o.   MRN: [id number]  HPI This is a 77 year old female who is unable to complete neoadjuvant endocrine therapy and was taken to the operating room where she underwent bilateral simple mastectomies along with bilateral axillary sentinel node biopsies. She remained in the hospital for 2 days and was discharged home doing well. She doing well today without any significant complaints. Her drains are still putting out too much serous fluid to remove today. Her pathology on the right shows a stage 0 breast cancer. This was all DCIS with a size of 3 cm. The closest margin was 1.6 cm in the deep margin and this is hormone receptor positive. I did remove 3 lymph nodes on this side obviously those are negative. On the left side where her invasive tumor was she ended up having an invasive grade one ductal carcinoma spanning 3.1 cm with lymphovascular invasion present. She has one intramammary node that was right next to her tumor that is positive. This is not a sentinel node as this did not light up with a probe placed in on her breast. It states her deep margin is positive. I actually removed some of the muscle along with the specimen where it appeared to be clinically involved. Clinically her margin is negative at this point and there is no more surgery to perform at this site. The left sentinel lymph nodes of which there are 5 are negative for any tumor. This is a [id number] tumor  it is hormone receptor positive at 100%.  Review of Systems     Objective:   Physical Exam Well healing left and right mastectomy incisions without infection, drains with serous fluid    Assessment:     Stage 0 right breast cancer, Stage 2 left breast cancer    Plan:     She is doing well today. I would have return next week where I should likely be able to start removing her drains at that point. She is going to need physical therapy once her drains are out  it is somewhat difficult for him move her arms at this point.  We discussed her right-sided pathology which she should be done with treatment for the right side. We also a long discussion about her left side. I do not think there is any further surgery to do for the deep margin as this is really her chest wall and her ribs at this point. The question is what to do about her lymph nodes at this point. Her sentinel nodes are all negative her axilla and the one node that is positive is an intramammary node that was not a sentinel lymph node. I ended up taking out 5 other lymph nodes so we could make a case of treating her systemically with no further surgery or that would be reasonable to do an axillary lymph node dissection. We discussed this at length today she would prefer not to have an axillary dissection but I told her we would discuss a multidisciplinary fashion and come up with the recommendation.

## 2011-08-30 NOTE — Telephone Encounter (Signed)
Script sent e-scribe 

## 2011-09-05 ENCOUNTER — Encounter (INDEPENDENT_AMBULATORY_CARE_PROVIDER_SITE_OTHER): Payer: Self-pay | Admitting: General Surgery

## 2011-09-05 ENCOUNTER — Ambulatory Visit (INDEPENDENT_AMBULATORY_CARE_PROVIDER_SITE_OTHER): Payer: Medicare Other | Admitting: General Surgery

## 2011-09-05 VITALS — BP 124/78 | HR 80 | Temp 97.2°F | Resp 16 | Ht 62.0 in | Wt 181.4 lb

## 2011-09-05 DIAGNOSIS — Z09 Encounter for follow-up examination after completed treatment for conditions other than malignant neoplasm: Secondary | ICD-10-CM

## 2011-09-05 NOTE — Patient Instructions (Signed)
If drains put out less than 30 cc for 2 consecutive days, please call my office.

## 2011-09-05 NOTE — Progress Notes (Signed)
Subjective:     Patient ID: April [name], female   DOB: 09-08-1929, 77 y.o.   MRN: [id number]  HPI This is a 77 year old female status post bilateral mastectomies. She comes in for a wound check and a drain check. Her right-sided drain is still putting out over 30 cc per day. Her left-sided drain one of them appears to be draining but the other appears to have decreased. She did not bring her paperwork today but we did discuss this at length and it appears to be the case. She otherwise reports no complaints. She has not been called by the cancer center to get her back in either.  Review of Systems     Objective:   Physical Exam Incisions bilaterally without infection, drains with serous fluid    Assessment:     S/p bilateral mastectomies    Plan:        Return in one week for likely removal of her other drains. If her drains put out less than 30 mL for 2 consecutive days I asked her to call me sooner. She is going to get into the cancer center to discuss other options for treatment also.

## 2011-09-12 ENCOUNTER — Encounter (INDEPENDENT_AMBULATORY_CARE_PROVIDER_SITE_OTHER): Payer: Self-pay | Admitting: General Surgery

## 2011-09-12 ENCOUNTER — Ambulatory Visit (INDEPENDENT_AMBULATORY_CARE_PROVIDER_SITE_OTHER): Payer: Medicare Other | Admitting: General Surgery

## 2011-09-12 VITALS — BP 128/60 | HR 102 | Temp 97.4°F | Resp 12 | Wt 171.4 lb

## 2011-09-12 DIAGNOSIS — Z09 Encounter for follow-up examination after completed treatment for conditions other than malignant neoplasm: Secondary | ICD-10-CM

## 2011-09-12 NOTE — Progress Notes (Signed)
Subjective:     Patient ID: April [name], female   DOB: [birth year], 77 y.o.   MRN: [id number]  HPI This is a 77 year old female who recently underwent bilateral simple mastectomies. She is doing well and comes in today just for a drain check. She has no real complaints. Her drains are putting out less than 30 cc each for the last 3 days.  Review of Systems     Objective:   Physical Exam    bilateral healing mastectomy incisions without infection, drains with minimal serous fluid Assessment:     S/p bilateral mastectomies    Plan:     I removed both of her drains today without incident. I warned her about symptoms of a seroma and told her to call me if she has any fluid buildup. I told her she could get a flu shot. She is due to go to the cancer center and a couple of weeks for followup as well. I will plan on seeing her in 2 weeks in followup also. I did give her prescription to go to the after breast cancer physical therapy class. I told her she could begin driving and showering now as well.

## 2011-09-28 ENCOUNTER — Telehealth: Payer: Self-pay | Admitting: *Deleted

## 2011-09-28 ENCOUNTER — Encounter (HOSPITAL_BASED_OUTPATIENT_CLINIC_OR_DEPARTMENT_OTHER): Payer: Medicare Other | Admitting: Oncology

## 2011-09-28 ENCOUNTER — Ambulatory Visit: Payer: Medicare Other | Admitting: Radiation Oncology

## 2011-09-28 ENCOUNTER — Other Ambulatory Visit: Payer: Self-pay | Admitting: Oncology

## 2011-09-28 DIAGNOSIS — C50919 Malignant neoplasm of unspecified site of unspecified female breast: Secondary | ICD-10-CM

## 2011-09-28 DIAGNOSIS — E559 Vitamin D deficiency, unspecified: Secondary | ICD-10-CM

## 2011-09-28 LAB — CBC WITH DIFFERENTIAL/PLATELET
BASO%: 1 % (ref 0.0–2.0)
Eosinophils Absolute: 0.2 10*3/uL (ref 0.0–0.5)
LYMPH%: 31 % (ref 14.0–49.7)
MCHC: 34 g/dL (ref 31.5–36.0)
MCV: 86.2 fL (ref 79.5–101.0)
MONO%: 9.3 % (ref 0.0–14.0)
NEUT%: 55.8 % (ref 38.4–76.8)
Platelets: 192 10*3/uL (ref 145–400)
RBC: 3.98 10*6/uL (ref 3.70–5.45)

## 2011-09-28 LAB — COMPREHENSIVE METABOLIC PANEL
Alkaline Phosphatase: 62 U/L (ref 39–117)
Creatinine, Ser: 0.99 mg/dL (ref 0.50–1.10)
Glucose, Bld: 86 mg/dL (ref 70–99)
Sodium: 138 mEq/L (ref 135–145)
Total Bilirubin: 0.3 mg/dL (ref 0.3–1.2)
Total Protein: 6.2 g/dL (ref 6.0–8.3)

## 2011-09-30 ENCOUNTER — Ambulatory Visit (INDEPENDENT_AMBULATORY_CARE_PROVIDER_SITE_OTHER): Payer: Medicare Other | Admitting: General Surgery

## 2011-09-30 ENCOUNTER — Encounter (INDEPENDENT_AMBULATORY_CARE_PROVIDER_SITE_OTHER): Payer: Self-pay | Admitting: General Surgery

## 2011-09-30 VITALS — BP 104/56 | HR 68 | Temp 97.2°F | Resp 14 | Ht 62.0 in | Wt 177.0 lb

## 2011-09-30 DIAGNOSIS — Z09 Encounter for follow-up examination after completed treatment for conditions other than malignant neoplasm: Secondary | ICD-10-CM

## 2011-09-30 NOTE — Progress Notes (Signed)
Subjective:     Patient ID: April [name], female   DOB: 1929/04/24, 77 y.o.   MRN: [id number]  HPI This is a 77 year old female well known to me after bilateral mastectomies and biopsies. She is doing well she has a small seroma on the left. She otherwise has no real complaints at this point. She is back to most of her normal activity. She is been discussed at multi-disciplinary conference and is due to see Dr. Mitzi Hansen for evaluation for radiation therapy.  Review of Systems     Objective:   Physical Exam Well healing mastectomy incisions with small left sided seroma    Assessment:     S/p bilateral mastectomies    Plan:     Referred to see radiation therapy I will see in four months PT referral

## 2011-10-05 ENCOUNTER — Ambulatory Visit: Payer: Medicare Other

## 2011-10-05 ENCOUNTER — Institutional Professional Consult (permissible substitution): Payer: Medicare Other | Admitting: Radiation Oncology

## 2011-10-07 ENCOUNTER — Encounter: Payer: Self-pay | Admitting: Radiation Oncology

## 2011-10-07 NOTE — Progress Notes (Signed)
77 year old female. Married. Retired Art gallery manager. 2 children (twins). EA:[id number] mammary carcinoma of the left breast, ER, PR +, and HER2 -. Presents today for consultation with Dr. Mitzi Hansen after surgery to review pathology results and determine whether radiatherapy would be appropriate. No known family hx of cancer. 08/25/2011 right simple mastectomy, left simple mastectomy, sentinel node identification, right and left axillary sentinel node biopsy done.

## 2011-10-10 ENCOUNTER — Ambulatory Visit
Admission: RE | Admit: 2011-10-10 | Discharge: 2011-10-10 | Disposition: A | Discharge status: Home / Self Care | Payer: Medicare Other | Source: Ambulatory Visit | Attending: Radiation Oncology | Admitting: Radiation Oncology

## 2011-10-10 ENCOUNTER — Telehealth: Payer: Self-pay | Admitting: Radiation Oncology

## 2011-10-10 ENCOUNTER — Encounter: Payer: Self-pay | Admitting: Radiation Oncology

## 2011-10-10 DIAGNOSIS — Z510 Encounter for antineoplastic radiation therapy: Secondary | ICD-10-CM | POA: Insufficient documentation

## 2011-10-10 DIAGNOSIS — Z7982 Long term (current) use of aspirin: Secondary | ICD-10-CM | POA: Insufficient documentation

## 2011-10-10 DIAGNOSIS — C50419 Malignant neoplasm of upper-outer quadrant of unspecified female breast: Secondary | ICD-10-CM

## 2011-10-10 DIAGNOSIS — Z9010 Acquired absence of unspecified breast and nipple: Secondary | ICD-10-CM | POA: Insufficient documentation

## 2011-10-10 DIAGNOSIS — Z170 Estrogen receptor positive status [ER+]: Secondary | ICD-10-CM | POA: Insufficient documentation

## 2011-10-10 DIAGNOSIS — C773 Secondary and unspecified malignant neoplasm of axilla and upper limb lymph nodes: Secondary | ICD-10-CM | POA: Insufficient documentation

## 2011-10-10 DIAGNOSIS — C50919 Malignant neoplasm of unspecified site of unspecified female breast: Secondary | ICD-10-CM | POA: Insufficient documentation

## 2011-10-10 DIAGNOSIS — Z79899 Other long term (current) drug therapy: Secondary | ICD-10-CM | POA: Insufficient documentation
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
## 2011-10-10 NOTE — Progress Notes (Signed)
Please see the Nurse Progress Note in the MD Initial Consult Encounter for this patient. 
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
## 2011-10-11 ENCOUNTER — Encounter: Payer: Self-pay | Admitting: Family Medicine

## 2011-10-11 ENCOUNTER — Ambulatory Visit (INDEPENDENT_AMBULATORY_CARE_PROVIDER_SITE_OTHER): Payer: Medicare Other | Admitting: Family Medicine

## 2011-10-11 VITALS — HR 90 | Temp 98.7°F | Wt 180.0 lb

## 2011-10-11 DIAGNOSIS — J329 Chronic sinusitis, unspecified: Secondary | ICD-10-CM

## 2011-10-11 MED ORDER — DOXYCYCLINE HYCLATE 100 MG PO CAPS: 100.0000 mg | ORAL_CAPSULE | Freq: Two times a day (BID) | ORAL | Status: AC

## 2011-10-11 NOTE — Progress Notes (Signed)
  Subjective:    Patient ID: April [name], female    DOB: November 28, 1929, 77 y.o.   MRN: [id number]  HPI Here for one week of a stuffy head, HA, ST,a nd dry cough. No fever. On Mucinex   Review of Systems  Constitutional: Negative.   HENT: Positive for congestion, postnasal drip and sinus pressure.   Eyes: Negative.   Respiratory: Negative.        Objective:   Physical Exam  Constitutional: She appears well-developed and well-nourished.  HENT:  Right Ear: External ear normal.  Left Ear: External ear normal.  Nose: Nose normal.  Mouth/Throat: Oropharynx is clear and moist. No oropharyngeal exudate.  Eyes: Conjunctivae are normal. Pupils are equal, round, and reactive to light.  Neck: Neck supple. No thyromegaly present.  Pulmonary/Chest: Effort normal and breath sounds normal.  Lymphadenopathy:    She has no cervical adenopathy.          Assessment & Plan:  Recheck prn

## 2011-10-12 ENCOUNTER — Ambulatory Visit: Payer: Medicare Other | Attending: General Surgery | Admitting: Physical Therapy

## 2011-10-12 DIAGNOSIS — M24519 Contracture, unspecified shoulder: Secondary | ICD-10-CM | POA: Insufficient documentation

## 2011-10-12 DIAGNOSIS — M25519 Pain in unspecified shoulder: Secondary | ICD-10-CM | POA: Insufficient documentation

## 2011-10-12 DIAGNOSIS — IMO0001 Reserved for inherently not codable concepts without codable children: Secondary | ICD-10-CM | POA: Insufficient documentation

## 2011-10-13 NOTE — Progress Notes (Signed)
CC:   Juanetta Gosling, MD Tera Mater Clent Ridges, MD Pierce Crane, M.D., F.R.C.P.C.  DIAGNOSIS:  Bilateral breast cancer.  INTERVAL HISTORY:  Mrs. Ange returns to the clinic today for followup. The patient has undergone neoadjuvant Femara since March of 2012 and now is status post bilateral mastectomies.  On the left, the final pathology did reveal an invasive ductal carcinoma, which was a grade 1 tumor measuring 3.1 cm.  One of 6 lymph nodes was positive for carcinoma and the deep margin of the excision was positive.  This corresponded to a ypT2, ypN1a tumor.  Receptor studies have indicated that the tumor is ER and PR positive as well as HER-2/neu negative.  On the right, the pathology returned positive for DCIS measuring 3 cm.  The margins were negative and none of the 3 lymph nodes examined were positive for carcinoma.  This tumor was ER and PR positive.  The patient's case was discussed at breast conference and she is felt to be an appropriate candidate for postmastectomy therapy on the left with no radiation needed on the right.  She, therefore, presents today for further evaluation in this regard.  The patient states that she has done well in terms of recovering from her surgery and she is pleased, therefore, with this.  She does have some concerns regarding her general situation with her husband having, she believes, early Alzheimer's and she is caring for him on a regular basis.  PHYSICAL EXAMINATION:  Vital Signs:  Weight 180 pounds.  Blood pressure 111/62.  Pulse 71.  Temperature 97.4.  Respiratory rate 20.  General Appearance:  A well-developed female in no acute distress.  Alert and oriented x3.  Neck:  Supple without any lymphadenopathy. Cardiovascular:  Regular rate and rhythm.  Respiratory:  Clear to auscultation bilaterally.  Breasts:  The patient is status post bilateral mastectomies and both surgical incisions are well-healed. There was no nodularity or suspicious  findings along either chest wall and no axillary adenopathy present on either side.  GI:  The abdomen is soft and nontender.  Normal bowel sounds.  Extremities:  No edema present.  Neuro:  No focal deficits.  IMPRESSION AND PLAN:  Mrs. Doubrava is a pleasant 77 year old female who is now status post neoadjuvant Femara followed by bilateral mastectomies. She is an appropriate candidate for postmastectomy radiotherapy on the left.  She does not need radiation to the right.  I discussed this recommendation with her as I believe that she is at high risk of local failure in the absence of further treatment.  She does have a positive margin and did have 1 lymph node positive for carcinoma as well.  I discussed this recommendation with her.  She does indicate that daily treatment may be somewhat difficult for her.  Although after discussing the potential importance of this treatment with her, she has decided to try to proceed with this.  She does not believe that she can begin before Thanksgiving, however, and given her concerns, we will work around this issue.  I will have the patient scheduled for a simulation later this week such that we can proceed with treatment planning.  I would anticipate, therefore, to begin her treatment the Monday after Thanksgiving.  I spent 30 minutes with Mrs. [place] today, the majority of which was spent counseling her on her diagnosis of breast cancer and coordinating her care.    ______________________________ Radene Gunning, M.D., Ph.D. JSM/MEDQ  D:  10/11/2011  T:  10/13/2011  Job:  [id number] 

## 2011-10-14 ENCOUNTER — Telehealth: Payer: Self-pay | Admitting: Radiation Oncology

## 2011-10-14 ENCOUNTER — Ambulatory Visit
Admission: RE | Admit: 2011-10-14 | Discharge: 2011-10-14 | Disposition: A | Discharge status: Home / Self Care | Payer: Medicare Other | Source: Ambulatory Visit | Attending: Radiation Oncology | Admitting: Radiation Oncology

## 2011-10-14 DIAGNOSIS — C50419 Malignant neoplasm of upper-outer quadrant of unspecified female breast: Secondary | ICD-10-CM

## 2011-10-14 NOTE — Telephone Encounter (Signed)
Met with patient to discuss RO billing. Diagnosis: 174.4 Upper-outer quadrant of breast.

## 2011-10-19 ENCOUNTER — Encounter: Payer: Self-pay | Admitting: *Deleted

## 2011-10-19 NOTE — Progress Notes (Signed)
[facility] Distress Screening Clinical Social Work was referred by distress screening protocol. Patient scored a 10 on the Psychosocial Distress Thermometer which indicates severe distress. Clinical Social Worker spoke with patient on the phone to assess for distress and other psychosocial needs.  Pt states she plans to begin radiation next week and currently, her biggest cause of distress is balancing her cancer treatment with her husbands alzheimers. Pt states she plans to take her spouse to a neurologist next month.  She is currently the primary caregiver and states her family lives out of town. Pt very interested in resources regarding caregiver help.  Pt plans to meet with pt next week after radiation treatment to provide support and resources.  Pt's distress thermometer can be viewed under Clinical Social Work in [employer] tab. Kathrin Penner, LCSWA

## 2011-10-25 ENCOUNTER — Ambulatory Visit
Admission: RE | Admit: 2011-10-25 | Discharge: 2011-10-25 | Disposition: A | Discharge status: Home / Self Care | Payer: Medicare Other | Source: Ambulatory Visit | Attending: Radiation Oncology | Admitting: Radiation Oncology

## 2011-10-25 DIAGNOSIS — C50419 Malignant neoplasm of upper-outer quadrant of unspecified female breast: Secondary | ICD-10-CM

## 2011-10-26 ENCOUNTER — Ambulatory Visit
Admission: RE | Admit: 2011-10-26 | Discharge: 2011-10-26 | Disposition: A | Discharge status: Home / Self Care | Payer: Medicare Other | Source: Ambulatory Visit | Attending: Radiation Oncology | Admitting: Radiation Oncology

## 2011-10-26 ENCOUNTER — Encounter: Payer: Self-pay | Admitting: Radiation Oncology

## 2011-10-26 DIAGNOSIS — C50419 Malignant neoplasm of upper-outer quadrant of unspecified female breast: Secondary | ICD-10-CM

## 2011-10-26 NOTE — Progress Notes (Signed)
Patient presented to the clinic today requesting to see Dr Mitzi Hansen. Informed patient Dr. Mitzi Hansen was covering the breast clinic but, Dr. Dayton Scrape could see her. Patient reports "I just don't feel well." Patient reports driving a long distance to [place] on Monday and not feeling like herself since. Patient denies pain. Patient has no other complaints. Vitals WDL. Patient decided to wait and see Dr. Mitzi Hansen today following treatment.

## 2011-10-27 ENCOUNTER — Encounter: Payer: Self-pay | Admitting: Radiation Oncology

## 2011-10-27 ENCOUNTER — Ambulatory Visit: Payer: Medicare Other

## 2011-10-27 ENCOUNTER — Ambulatory Visit
Admission: RE | Admit: 2011-10-27 | Discharge: 2011-10-27 | Disposition: A | Discharge status: Home / Self Care | Payer: Medicare Other | Source: Ambulatory Visit | Attending: Radiation Oncology | Admitting: Radiation Oncology

## 2011-10-27 DIAGNOSIS — Z510 Encounter for antineoplastic radiation therapy: Secondary | ICD-10-CM | POA: Insufficient documentation

## 2011-10-27 DIAGNOSIS — C50419 Malignant neoplasm of upper-outer quadrant of unspecified female breast: Secondary | ICD-10-CM

## 2011-10-27 DIAGNOSIS — C50919 Malignant neoplasm of unspecified site of unspecified female breast: Secondary | ICD-10-CM | POA: Insufficient documentation

## 2011-10-27 DIAGNOSIS — C773 Secondary and unspecified malignant neoplasm of axilla and upper limb lymph nodes: Secondary | ICD-10-CM | POA: Insufficient documentation

## 2011-10-27 NOTE — Progress Notes (Signed)
Patient presents to the clinic today for under treat visit with Dr. Mitzi Hansen. No distress noted. Steady gait noted with assistance of cane. Pleasant affect noted. Patient reports "I just don't feel like myself. I feel like I did before the surgery. I feel better today than yesterday. I just don't have any energy."Patient denies pain at this time. Patient reports intermittent brief episodes of sharp left sided pain. Reported all findings to Dr. Mitzi Hansen.

## 2011-10-27 NOTE — Progress Notes (Signed)
[facility] Radiation Oncology Weekly Treatment Note    Name: LATAUNYA RUUD Date: 10/27/2011 MRN: [id number] DOB: [birth year]  Status:outpatient    Current dose: 360  Current fraction:2  Planned dose:6040  Planned fraction:33   MEDICATIONS: Current Outpatient Prescriptions  Medication Sig Dispense Refill  . anastrozole (ARIMIDEX) 1 MG tablet Take 1 mg by mouth daily.        Marland Kitchen aspirin 81 MG tablet Take 81 mg by mouth daily.        . Cholecalciferol (VITAMIN D) 1000 UNITS capsule Take 1,000 Units by mouth daily.        . diclofenac (VOLTAREN) 50 MG EC tablet Take 50 mg by mouth 3 (three) times daily. Prn pain       . ergocalciferol (VITAMIN D2) 50000 UNITS capsule Take 50,000 Units by mouth once a week.        Marland Kitchen lisinopril (PRINIVIL,ZESTRIL) 20 MG tablet TAKE 1 TABLET ONCE DAILY.  30 tablet  3  . PRILOSEC 40 MG capsule TAKE 1 CAPSULE EVERY DAY.  30 each  11  . promethazine (PHENERGAN) 25 MG tablet Take 25 mg by mouth every 4 (four) hours as needed. nausea       . Psyllium (METAMUCIL) 48.57 % POWD Take by mouth daily. As directed          ALLERGIES: Cefaclor; Lansoprazole; Penicillins; Sulfa drugs cross reactors; and Sulfamethoxazole   LABORATORY DATA:  Lab Results  Component Value Date   WBC 7.1 09/28/2011   HGB 11.6 09/28/2011   HCT 34.3* 09/28/2011   MCV 86.2 09/28/2011   PLT 192 09/28/2011   Lab Results  Component Value Date   NA 138 09/28/2011   K 4.4 09/28/2011   CL 103 09/28/2011   CO2 24 09/28/2011   Lab Results  Component Value Date   ALT 11 09/28/2011   AST 11 09/28/2011   ALKPHOS 62 09/28/2011   BILITOT 0.3 09/28/2011      NARRATIVE: Caydee B Tomberlin was seen today for weekly treatment management. The chart was checked and port films images were reviewed. Pt has started xrt - no problems with treatment.   PHYSICAL EXAMINATION: weight is 178 lb 3.2 oz (80.831 kg).       ASSESSMENT: Patient tolerating treatments well.    PLAN: Continue  treatment as planned.

## 2011-10-27 NOTE — Progress Notes (Signed)
Encounter addended by: Delynn Flavin, RN on: 10/27/2011 11:16 AM<BR>     Documentation filed: Charges VN

## 2011-10-28 ENCOUNTER — Ambulatory Visit
Admission: RE | Admit: 2011-10-28 | Discharge: 2011-10-28 | Disposition: A | Discharge status: Home / Self Care | Payer: Medicare Other | Source: Ambulatory Visit | Attending: Radiation Oncology | Admitting: Radiation Oncology

## 2011-10-31 ENCOUNTER — Ambulatory Visit: Payer: Medicare Other

## 2011-10-31 ENCOUNTER — Other Ambulatory Visit: Payer: Self-pay | Admitting: Family Medicine

## 2011-10-31 ENCOUNTER — Ambulatory Visit (INDEPENDENT_AMBULATORY_CARE_PROVIDER_SITE_OTHER): Payer: Medicare Other | Admitting: Family Medicine

## 2011-10-31 ENCOUNTER — Encounter: Payer: Self-pay | Admitting: Family Medicine

## 2011-10-31 VITALS — BP 112/60 | HR 81 | Temp 98.5°F | Wt 174.0 lb

## 2011-10-31 DIAGNOSIS — J329 Chronic sinusitis, unspecified: Secondary | ICD-10-CM

## 2011-10-31 MED ORDER — LEVOFLOXACIN 500 MG PO TABS: 500.0000 mg | ORAL_TABLET | Freq: Every day | ORAL | Status: AC

## 2011-10-31 NOTE — Progress Notes (Signed)
  Subjective:    Patient ID: April [name], female    DOB: 1928-12-16, 77 y.o.   MRN: [id number]  HPI Here for continued sinus symptoms. She was here on 10-11-11 for this and was given Doxycycline. This helped but her symptoms did not go away. She still has sinus pressure, HA, PND, and a dry cough. No fever.    Review of Systems  Constitutional: Negative.   HENT: Positive for congestion, postnasal drip and sinus pressure.   Eyes: Negative.   Respiratory: Positive for cough.        Objective:   Physical Exam  Constitutional: She appears well-developed and well-nourished.  HENT:  Right Ear: External ear normal.  Left Ear: External ear normal.  Nose: Nose normal.  Mouth/Throat: Oropharynx is clear and moist. No oropharyngeal exudate.  Eyes: Conjunctivae are normal.  Neck: No thyromegaly present.  Pulmonary/Chest: Effort normal and breath sounds normal.  Lymphadenopathy:    She has no cervical adenopathy.          Assessment & Plan:  Partially treated sinusitis.

## 2011-11-01 ENCOUNTER — Ambulatory Visit: Payer: Medicare Other

## 2011-11-02 ENCOUNTER — Ambulatory Visit
Admission: RE | Admit: 2011-11-02 | Discharge: 2011-11-02 | Disposition: A | Discharge status: Home / Self Care | Payer: Medicare Other | Source: Ambulatory Visit | Attending: Radiation Oncology | Admitting: Radiation Oncology

## 2011-11-02 ENCOUNTER — Encounter: Payer: Self-pay | Admitting: Radiation Oncology

## 2011-11-02 VITALS — Wt 178.1 lb

## 2011-11-02 DIAGNOSIS — C50419 Malignant neoplasm of upper-outer quadrant of unspecified female breast: Secondary | ICD-10-CM

## 2011-11-02 MED ORDER — RADIAPLEXRX EX GEL
Freq: Two times a day (BID) | CUTANEOUS | Status: DC
  Administered 2011-11-02: 16:00:00 via TOPICAL

## 2011-11-02 MED ORDER — ALRA NON-METALLIC DEODORANT (RAD-ONC)
1.0000 "application " | Freq: Once | TOPICAL | Status: AC
  Administered 2011-11-02: 1 via TOPICAL

## 2011-11-02 NOTE — Progress Notes (Signed)
Encounter addended by: Ardell Isaacs on: 11/02/2011  5:51 PM<BR>     Documentation filed: Chief Complaint Section, Notes Section

## 2011-11-02 NOTE — Progress Notes (Signed)
Patient presented to the clinic today following XRT for post sim education. Patient is alert and oriented to person, place, and time. No distress noted. Steady gait noted with assistance of cane. Pleasant affect noted. Oriented patient to staff and routine of the clinic. Provided patient with "Radiation Therapy and You" handbook. Reviewed pertinent information within the handbook with the patient. Provided patient with Alra and Radiaplex then, instructed upon use. Encouraged patient to notify staff with needs. Patient verbalized understanding. All questions answered.

## 2011-11-02 NOTE — Progress Notes (Signed)
Patient doing OK with XRT. Has missed a couple of days due to a sinus infection. On antibiotics. Will continue xrt.

## 2011-11-02 NOTE — Progress Notes (Signed)
Patient presented to the clinic today for under treat visit with Dr. Mitzi Hansen and post sim education. Patient A&Ox3. No distress noted. Slow steady gait noted with assistance of cane. Patient denies pain. Patient has no complaints at this time.

## 2011-11-03 ENCOUNTER — Telehealth: Payer: Self-pay | Admitting: *Deleted

## 2011-11-03 ENCOUNTER — Ambulatory Visit: Payer: Medicare Other

## 2011-11-03 ENCOUNTER — Telehealth: Payer: Self-pay | Admitting: Radiation Oncology

## 2011-11-03 NOTE — Telephone Encounter (Signed)
11:29 AM Patient phoned stating,"I won't be able to make it for my treatment today. I am sick. The strong antibiotic I was given for my sinus infection has made me nauseous, vomit, and dizzy." Encouraged patient to notify her PCP (one who prescribed these antibiotics)of these symptoms. Instructed/encouraged patient to keep all radiation appointments. Patient verbalized understanding. Notified Dr. Mitzi Hansen of this finding. Notified Miranda, RT RT of this finding.

## 2011-11-03 NOTE — Telephone Encounter (Signed)
Pt is taking levaquin- now she is dizzy and nausea. Pt has d/c the antibiotic today. She has taken 3 days of it.

## 2011-11-04 ENCOUNTER — Ambulatory Visit
Admission: RE | Admit: 2011-11-04 | Discharge: 2011-11-04 | Disposition: A | Discharge status: Home / Self Care | Payer: Medicare Other | Source: Ambulatory Visit | Attending: Radiation Oncology | Admitting: Radiation Oncology

## 2011-11-04 MED ORDER — AZITHROMYCIN 250 MG PO TABS: ORAL_TABLET | ORAL | Status: DC

## 2011-11-04 NOTE — Telephone Encounter (Signed)
Spoke with pt and called in new script.

## 2011-11-04 NOTE — Telephone Encounter (Signed)
Stop the Levaquin and call in a Zpack

## 2011-11-07 ENCOUNTER — Ambulatory Visit
Admission: RE | Admit: 2011-11-07 | Discharge: 2011-11-07 | Disposition: A | Discharge status: Home / Self Care | Payer: Medicare Other | Source: Ambulatory Visit | Attending: Radiation Oncology | Admitting: Radiation Oncology

## 2011-11-07 NOTE — Procedures (Signed)
DIAGNOSIS:  Breast cancer.  NARRATIVE:  The patient presented for port films prior to beginning her course of radiation.  The isocenter was accurately placed and the blocks appropriately delineated the target treatment region.  Therefore, we will proceed with her treatment as planned.    ______________________________ Radene Gunning, M.D., Ph.D. JSM/MEDQ  D:  11/06/2011  T:  11/07/2011  Job:  [id number]

## 2011-11-08 ENCOUNTER — Ambulatory Visit
Admission: RE | Admit: 2011-11-08 | Discharge: 2011-11-08 | Disposition: A | Discharge status: Home / Self Care | Payer: Medicare Other | Source: Ambulatory Visit | Attending: Radiation Oncology | Admitting: Radiation Oncology

## 2011-11-09 ENCOUNTER — Ambulatory Visit
Admission: RE | Admit: 2011-11-09 | Discharge: 2011-11-09 | Disposition: A | Discharge status: Home / Self Care | Payer: Medicare Other | Source: Ambulatory Visit | Attending: Radiation Oncology | Admitting: Radiation Oncology

## 2011-11-10 ENCOUNTER — Encounter: Payer: Self-pay | Admitting: Radiation Oncology

## 2011-11-10 ENCOUNTER — Ambulatory Visit
Admission: RE | Admit: 2011-11-10 | Discharge: 2011-11-10 | Disposition: A | Discharge status: Home / Self Care | Payer: Medicare Other | Source: Ambulatory Visit | Attending: Radiation Oncology | Admitting: Radiation Oncology

## 2011-11-10 DIAGNOSIS — C50419 Malignant neoplasm of upper-outer quadrant of unspecified female breast: Secondary | ICD-10-CM

## 2011-11-10 NOTE — Progress Notes (Signed)
[facility] Radiation Oncology Weekly Treatment Note    Name: April [name] Date: 11/10/2011 MRN: [id number] DOB: 12-Aug-1929  Status:outpatient    Current dose: 1620  Current fraction:9  Planned dose:6040  Planned fraction:33   MEDICATIONS: Current Outpatient Prescriptions  Medication Sig Dispense Refill  . aspirin 81 MG tablet Take 81 mg by mouth daily.        Marland Kitchen azithromycin (ZITHROMAX) 250 MG tablet Take 2 tablets (500 mg) on  Day 1,  followed by 1 tablet (250 mg) once daily on Days 2 through 5.       . Cholecalciferol (VITAMIN D) 1000 UNITS capsule Take 1,000 Units by mouth daily.        . diclofenac (VOLTAREN) 50 MG EC tablet Take 50 mg by mouth 3 (three) times daily. Prn pain       . levofloxacin (LEVAQUIN) 500 MG tablet Take 1 tablet (500 mg total) by mouth daily.  10 tablet  0  . lisinopril (PRINIVIL,ZESTRIL) 20 MG tablet TAKE 1 TABLET ONCE DAILY.  30 tablet  5  . PRILOSEC 40 MG capsule TAKE 1 CAPSULE EVERY DAY.  30 each  11  . promethazine (PHENERGAN) 25 MG tablet Take 25 mg by mouth every 4 (four) hours as needed. nausea       . Psyllium (METAMUCIL) 48.57 % POWD Take by mouth daily. As directed          ALLERGIES: Cefaclor; Lansoprazole; Penicillins; Sulfa drugs cross reactors; and Sulfamethoxazole   LABORATORY DATA:  Lab Results  Component Value Date   WBC 7.1 09/28/2011   HGB 11.6 09/28/2011   HCT 34.3* 09/28/2011   MCV 86.2 09/28/2011   PLT 192 09/28/2011   Lab Results  Component Value Date   NA 138 09/28/2011   K 4.4 09/28/2011   CL 103 09/28/2011   CO2 24 09/28/2011   Lab Results  Component Value Date   ALT 11 09/28/2011   AST 11 09/28/2011   ALKPHOS 62 09/28/2011   BILITOT 0.3 09/28/2011      NARRATIVE: April [name] was seen today for weekly treatment management. The chart was checked and port films images were reviewed. Pt doing well. Some fatigue. Also some dizziness - began meclizine that her hysband had.  PHYSICAL  EXAMINATION: weight is 176 lb 6.4 oz (80.015 kg). Her blood pressure is 113/76 and her pulse is 80. Her respiration is 18 and oxygen saturation is 97%.       ASSESSMENT: Patient tolerating treatments well.    PLAN: Continue treatment as planned. Pt to stop taking non-prescribed medicines. Will drink more fluids - to let us know if this persists.

## 2011-11-10 NOTE — Progress Notes (Signed)
Patient presents to the clinic today unaccompanied for under treat visit with Dr. Mitzi Hansen. Patient is alert and oriented to person, place, and time. No distress noted. Slow steady gait noted with assistance of cane. No distress noted. Patient denies pain at this time. Patient reports taking Azithromycin for a sinus infection. Patient reports intermittent dizzy spell for which she is taking her husband's meclizine 25mg .

## 2011-11-11 ENCOUNTER — Ambulatory Visit
Admission: RE | Admit: 2011-11-11 | Discharge: 2011-11-11 | Disposition: A | Discharge status: Home / Self Care | Payer: Medicare Other | Source: Ambulatory Visit | Attending: Radiation Oncology | Admitting: Radiation Oncology

## 2011-11-14 ENCOUNTER — Ambulatory Visit
Admission: RE | Admit: 2011-11-14 | Discharge: 2011-11-14 | Disposition: A | Discharge status: Home / Self Care | Payer: Medicare Other | Source: Ambulatory Visit | Attending: Radiation Oncology | Admitting: Radiation Oncology

## 2011-11-15 ENCOUNTER — Ambulatory Visit
Admission: RE | Admit: 2011-11-15 | Discharge: 2011-11-15 | Disposition: A | Discharge status: Home / Self Care | Payer: Medicare Other | Source: Ambulatory Visit | Attending: Radiation Oncology | Admitting: Radiation Oncology

## 2011-11-16 ENCOUNTER — Ambulatory Visit
Admission: RE | Admit: 2011-11-16 | Discharge: 2011-11-16 | Disposition: A | Discharge status: Home / Self Care | Payer: Medicare Other | Source: Ambulatory Visit | Attending: Radiation Oncology | Admitting: Radiation Oncology

## 2011-11-17 ENCOUNTER — Ambulatory Visit
Admission: RE | Admit: 2011-11-17 | Discharge: 2011-11-17 | Disposition: A | Discharge status: Home / Self Care | Payer: Medicare Other | Source: Ambulatory Visit | Attending: Radiation Oncology | Admitting: Radiation Oncology

## 2011-11-17 ENCOUNTER — Encounter: Payer: Self-pay | Admitting: Radiation Oncology

## 2011-11-17 DIAGNOSIS — C50419 Malignant neoplasm of upper-outer quadrant of unspecified female breast: Secondary | ICD-10-CM

## 2011-11-17 NOTE — Progress Notes (Signed)
Patient presents to the clinic today unaccompanied for under treat visit with Dr. Mitzi Hansen. Patient is alert and oriented to person, place, and time. No distress noted. Slow and steady gait with assistance of cane noted. Patient denies pain at this time. Patient reports her appetite has increased. Patient reports her energy level has increased. Patient reports that she is not sleeping at night.

## 2011-11-18 ENCOUNTER — Ambulatory Visit
Admission: RE | Admit: 2011-11-18 | Discharge: 2011-11-18 | Disposition: A | Discharge status: Home / Self Care | Payer: Medicare Other | Source: Ambulatory Visit | Attending: Radiation Oncology | Admitting: Radiation Oncology

## 2011-11-18 DIAGNOSIS — C50419 Malignant neoplasm of upper-outer quadrant of unspecified female breast: Secondary | ICD-10-CM

## 2011-11-18 NOTE — Progress Notes (Signed)
[facility] Radiation Oncology Weekly Treatment Note    Name: April [name] Date: 11/18/2011 MRN: [id number] DOB: [birth year]  Status: outpatient    Current dose: 2520  Current fraction:7  Planned dose:6040  Planned fraction:14   MEDICATIONS: Current Outpatient Prescriptions  Medication Sig Dispense Refill  . aspirin 81 MG tablet Take 81 mg by mouth daily.        . Cholecalciferol (VITAMIN D) 1000 UNITS capsule Take 1,000 Units by mouth daily.        . diclofenac (VOLTAREN) 50 MG EC tablet Take 50 mg by mouth 3 (three) times daily. Prn pain       . lisinopril (PRINIVIL,ZESTRIL) 20 MG tablet TAKE 1 TABLET ONCE DAILY.  30 tablet  5  . PRILOSEC 40 MG capsule TAKE 1 CAPSULE EVERY DAY.  30 each  11  . promethazine (PHENERGAN) 25 MG tablet Take 25 mg by mouth every 4 (four) hours as needed. nausea       . Psyllium (METAMUCIL) 48.57 % POWD Take by mouth daily. As directed          ALLERGIES: Cefaclor; Lansoprazole; Penicillins; Sulfa drugs cross reactors; and Sulfamethoxazole   LABORATORY DATA:  Lab Results  Component Value Date   WBC 7.1 09/28/2011   HGB 11.6 09/28/2011   HCT 34.3* 09/28/2011   MCV 86.2 09/28/2011   PLT 192 09/28/2011   Lab Results  Component Value Date   NA 138 09/28/2011   K 4.4 09/28/2011   CL 103 09/28/2011   CO2 24 09/28/2011   Lab Results  Component Value Date   ALT 11 09/28/2011   AST 11 09/28/2011   ALKPHOS 62 09/28/2011   BILITOT 0.3 09/28/2011      NARRATIVE: April [name] was seen today for weekly treatment management. The chart was checked and port films images were reviewed. Pt doing well. Some continued skin irritation.  PHYSICAL EXAMINATION: weight is 178 lb (80.74 kg).  diffuse moderate skin changes - looks good overall  ASSESSMENT: Patient tolerating treatments well.    PLAN: Continue treatment as planned.

## 2011-11-21 ENCOUNTER — Ambulatory Visit
Admission: RE | Admit: 2011-11-21 | Discharge: 2011-11-21 | Disposition: A | Discharge status: Home / Self Care | Payer: Medicare Other | Source: Ambulatory Visit | Attending: Radiation Oncology | Admitting: Radiation Oncology

## 2011-11-23 ENCOUNTER — Ambulatory Visit
Admission: RE | Admit: 2011-11-23 | Discharge: 2011-11-23 | Disposition: A | Discharge status: Home / Self Care | Payer: Medicare Other | Source: Ambulatory Visit | Attending: Radiation Oncology | Admitting: Radiation Oncology

## 2011-11-24 ENCOUNTER — Ambulatory Visit
Admission: RE | Admit: 2011-11-24 | Discharge: 2011-11-24 | Disposition: A | Discharge status: Home / Self Care | Payer: Medicare Other | Source: Ambulatory Visit | Attending: Radiation Oncology | Admitting: Radiation Oncology

## 2011-11-25 ENCOUNTER — Ambulatory Visit
Admission: RE | Admit: 2011-11-25 | Discharge: 2011-11-25 | Disposition: A | Discharge status: Home / Self Care | Payer: Medicare Other | Source: Ambulatory Visit | Attending: Radiation Oncology | Admitting: Radiation Oncology

## 2011-11-25 ENCOUNTER — Encounter: Payer: Self-pay | Admitting: Radiation Oncology

## 2011-11-25 VITALS — Wt 176.9 lb

## 2011-11-25 DIAGNOSIS — C50419 Malignant neoplasm of upper-outer quadrant of unspecified female breast: Secondary | ICD-10-CM

## 2011-11-25 NOTE — Progress Notes (Signed)
  Radiation Oncology         [phone] ________________________________  Name: April [name] MRN: [id number]  Date: 11/25/2011  DOB: 1929-06-12  Weekly Radiation Therapy Management  Current Dose: 34.2 Gy     Planned Dose:  50.4 Gy  Narrative . . . . . . . . The patient presents for routine under treatment assessment.                                                     The patient is without complaint.                                 Set-up films were reviewed.                                 The chart was checked. Physical Findings. . . Weight essentially stable.  No significant changes.  Erythema throughout treatment area. Impression . . . . . . . The patient is  tolerating radiation. Plan . . . . . . . . . . . . Continue treatment as planned.  ________________________________  Artist Pais. Kathrynn Running, M.D.

## 2011-11-25 NOTE — Progress Notes (Signed)
Pt states she has fatigue after tx, some soreness in upper arms and occass pain in L axilla. She states she has been reaching into upper cabinets at home recently. Advised she inform dr if this pain persists or worsens.

## 2011-11-28 ENCOUNTER — Ambulatory Visit
Admission: RE | Admit: 2011-11-28 | Discharge: 2011-11-28 | Disposition: A | Discharge status: Home / Self Care | Payer: Medicare Other | Source: Ambulatory Visit | Attending: Radiation Oncology | Admitting: Radiation Oncology

## 2011-11-29 ENCOUNTER — Ambulatory Visit: Payer: Medicare Other

## 2011-11-30 ENCOUNTER — Ambulatory Visit
Admission: RE | Admit: 2011-11-30 | Discharge: 2011-11-30 | Disposition: A | Discharge status: Home / Self Care | Payer: Medicare Other | Source: Ambulatory Visit | Attending: Radiation Oncology | Admitting: Radiation Oncology

## 2011-11-30 DIAGNOSIS — Z9010 Acquired absence of unspecified breast and nipple: Secondary | ICD-10-CM | POA: Diagnosis not present

## 2011-11-30 DIAGNOSIS — Z7982 Long term (current) use of aspirin: Secondary | ICD-10-CM | POA: Diagnosis not present

## 2011-11-30 DIAGNOSIS — C50419 Malignant neoplasm of upper-outer quadrant of unspecified female breast: Secondary | ICD-10-CM | POA: Diagnosis not present

## 2011-11-30 DIAGNOSIS — C773 Secondary and unspecified malignant neoplasm of axilla and upper limb lymph nodes: Secondary | ICD-10-CM | POA: Diagnosis not present

## 2011-11-30 DIAGNOSIS — C50919 Malignant neoplasm of unspecified site of unspecified female breast: Secondary | ICD-10-CM | POA: Diagnosis not present

## 2011-11-30 DIAGNOSIS — Z170 Estrogen receptor positive status [ER+]: Secondary | ICD-10-CM | POA: Diagnosis not present

## 2011-11-30 DIAGNOSIS — Z79899 Other long term (current) drug therapy: Secondary | ICD-10-CM | POA: Diagnosis not present

## 2011-11-30 DIAGNOSIS — Z510 Encounter for antineoplastic radiation therapy: Secondary | ICD-10-CM | POA: Diagnosis not present

## 2011-12-01 ENCOUNTER — Encounter: Payer: Self-pay | Admitting: Radiation Oncology

## 2011-12-01 ENCOUNTER — Ambulatory Visit
Admission: RE | Admit: 2011-12-01 | Discharge: 2011-12-01 | Disposition: A | Discharge status: Home / Self Care | Payer: Medicare Other | Source: Ambulatory Visit | Attending: Radiation Oncology | Admitting: Radiation Oncology

## 2011-12-01 DIAGNOSIS — Z9010 Acquired absence of unspecified breast and nipple: Secondary | ICD-10-CM | POA: Diagnosis not present

## 2011-12-01 DIAGNOSIS — C50919 Malignant neoplasm of unspecified site of unspecified female breast: Secondary | ICD-10-CM | POA: Diagnosis not present

## 2011-12-01 DIAGNOSIS — Z79899 Other long term (current) drug therapy: Secondary | ICD-10-CM | POA: Diagnosis not present

## 2011-12-01 DIAGNOSIS — Z510 Encounter for antineoplastic radiation therapy: Secondary | ICD-10-CM | POA: Diagnosis not present

## 2011-12-01 DIAGNOSIS — Z170 Estrogen receptor positive status [ER+]: Secondary | ICD-10-CM | POA: Diagnosis not present

## 2011-12-01 DIAGNOSIS — C50419 Malignant neoplasm of upper-outer quadrant of unspecified female breast: Secondary | ICD-10-CM

## 2011-12-01 DIAGNOSIS — C773 Secondary and unspecified malignant neoplasm of axilla and upper limb lymph nodes: Secondary | ICD-10-CM | POA: Diagnosis not present

## 2011-12-01 NOTE — Progress Notes (Signed)
[facility] Radiation Oncology Weekly Treatment Note    Name: April [name] Date: 12/01/2011 MRN: [id number] DOB: Jun 18, 1929  Status: outpatient    Current dose: 3960  Current fraction:22  Planned dose:6040  Planned fraction:33   MEDICATIONS: Current Outpatient Prescriptions  Medication Sig Dispense Refill  . aspirin 81 MG tablet Take 81 mg by mouth daily.        . Cholecalciferol (VITAMIN D) 1000 UNITS capsule Take 1,000 Units by mouth daily.        . diclofenac (VOLTAREN) 50 MG EC tablet Take 50 mg by mouth 3 (three) times daily. Prn pain       . lisinopril (PRINIVIL,ZESTRIL) 20 MG tablet TAKE 1 TABLET ONCE DAILY.  30 tablet  5  . PRILOSEC 40 MG capsule TAKE 1 CAPSULE EVERY DAY.  30 each  11  . promethazine (PHENERGAN) 25 MG tablet Take 25 mg by mouth every 4 (four) hours as needed. nausea       . Psyllium (METAMUCIL) 48.57 % POWD Take by mouth daily. As directed          ALLERGIES: Cefaclor; Lansoprazole; Penicillins; Sulfa drugs cross reactors; and Sulfamethoxazole   LABORATORY DATA:  Lab Results  Component Value Date   WBC 7.1 09/28/2011   HGB 11.6 09/28/2011   HCT 34.3* 09/28/2011   MCV 86.2 09/28/2011   PLT 192 09/28/2011   Lab Results  Component Value Date   NA 138 09/28/2011   K 4.4 09/28/2011   CL 103 09/28/2011   CO2 24 09/28/2011   Lab Results  Component Value Date   ALT 11 09/28/2011   AST 11 09/28/2011   ALKPHOS 62 09/28/2011   BILITOT 0.3 09/28/2011      NARRATIVE: April [name] was seen today for weekly treatment management. The chart was checked and port films images were reviewed. Pt doing well. No complaints.  PHYSICAL EXAMINATION: weight is 176 lb 3.2 oz (79.924 kg).    Erythema greatest in sclv with some dequamation forming   ASSESSMENT: Patient tolerating treatments well.    PLAN: Continue treatment as planned. Pt to also add neosporin to sclv.

## 2011-12-01 NOTE — Progress Notes (Signed)
PT has no c/o today; advised she apply Neosporin nightly to small area of dry desq in upper breast area of tx field.

## 2011-12-01 NOTE — Progress Notes (Signed)
DIAGNOSIS:  Left-sided breast cancer.  NARRATIVE:  April [name] presented for her 1st fraction of radiation for her left-sided breast cancer corresponding to a course of postmastectomy radiation.  She will be treated with bolus on an every-other-day basis during the initial portion of her treatment.  This will consist of 1 cm of bolus.  Therefore, a piece of bolus was fashioned with this depth, and this will be used during her initial course.  This, therefore, corresponds to a simple treatment device.    ______________________________ Radene Gunning, M.D., Ph.D. JSM/MEDQ  D:  12/01/2011  T:  12/01/2011  Job:  [id number]

## 2011-12-01 NOTE — Progress Notes (Signed)
DIAGNOSIS:  Left-sided breast cancer.  NARRATIVE:  April [name] presented for simulation for her upcoming course of adjuvant radiotherapy.  The patient will receive treatment to the left chest wall and left supraclavicular region.  The patient was placed in a supine position with the use of a customized Quest board. Additionally, a customized Acu-Form device was also constructed to aid in patient immobilization during her treatment.  This complex treatment device will be used on a daily basis for her treatment.  In this fashion, a CT scan was obtained through the chest region, and an isocenter was placed near the upper aspect of the left chest wall.  April [name] initially will be planned to receive 50.4 Gy to the left chest wall and left supraclavicular region.  This will consist of a 3D conformal technique.  The patient's initial treatment will utilize 3 customized blocks which have been designed for this purpose.  These will correspond to medial and lateral tangent fields as well as a left supraclavicular field.  The dose volume histograms of the scar, lungs, heart and spinal cord have been requested, and these have been contoured as target and normal tissue structures, respectively.  The patient's treatment will consist of 1.0 cm of bolus on an every-other-day basis over the chest wall to ensure that the skin receives adequate dose.  The dose volume histograms of each of the above target and normal structures will be carefully reviewed as part of the 3D conformal treatment planning process.  It is anticipated that the patient will then receive a 10 Gy boost to the surgical scar to yield a final total dose of 60.4 Gy.    ______________________________ Radene Gunning, M.D., Ph.D. JSM/MEDQ  D:  12/01/2011  T:  12/01/2011  Job:  [id number]

## 2011-12-01 NOTE — Procedures (Signed)
DIAGNOSIS:  Left-sided breast cancer.  NARRATIVE:  April [name] has undergone a complex simulation with regards to her course of adjuvant radiation for her left-sided breast cancer.  The patient initially has been planned to receive 50.4 Gy.  She will then subsequently receive a 10 Gy boost to the surgical scar plus margin. This will consist of a single en face electron field, and therefore, 1 additional customized block has been designed for this purpose.  This will correspond to a field utilizing 6 MeV electrons, and this plan will be normalized to the 90% isodose line.  Additionally, 0.5 cm of bolus will be used for the final portion of her treatment.  This therefore will yield a total dose of 60.4 Gy.  A special port  plan is requested for this final portion of her treatment.    ______________________________ Radene Gunning, M.D., Ph.D. JSM/MEDQ  D:  12/01/2011  T:  12/01/2011  Job:  [id number]

## 2011-12-02 ENCOUNTER — Ambulatory Visit
Admission: RE | Admit: 2011-12-02 | Discharge: 2011-12-02 | Disposition: A | Discharge status: Home / Self Care | Payer: Medicare Other | Source: Ambulatory Visit | Attending: Radiation Oncology | Admitting: Radiation Oncology

## 2011-12-02 DIAGNOSIS — C50919 Malignant neoplasm of unspecified site of unspecified female breast: Secondary | ICD-10-CM | POA: Diagnosis not present

## 2011-12-02 DIAGNOSIS — Z170 Estrogen receptor positive status [ER+]: Secondary | ICD-10-CM | POA: Diagnosis not present

## 2011-12-02 DIAGNOSIS — Z79899 Other long term (current) drug therapy: Secondary | ICD-10-CM | POA: Diagnosis not present

## 2011-12-02 DIAGNOSIS — Z9010 Acquired absence of unspecified breast and nipple: Secondary | ICD-10-CM | POA: Diagnosis not present

## 2011-12-02 DIAGNOSIS — C773 Secondary and unspecified malignant neoplasm of axilla and upper limb lymph nodes: Secondary | ICD-10-CM | POA: Diagnosis not present

## 2011-12-02 DIAGNOSIS — Z510 Encounter for antineoplastic radiation therapy: Secondary | ICD-10-CM | POA: Diagnosis not present

## 2011-12-05 ENCOUNTER — Ambulatory Visit
Admission: RE | Admit: 2011-12-05 | Discharge: 2011-12-05 | Disposition: A | Discharge status: Home / Self Care | Payer: Medicare Other | Source: Ambulatory Visit | Attending: Radiation Oncology | Admitting: Radiation Oncology

## 2011-12-05 DIAGNOSIS — Z9010 Acquired absence of unspecified breast and nipple: Secondary | ICD-10-CM | POA: Diagnosis not present

## 2011-12-05 DIAGNOSIS — C50919 Malignant neoplasm of unspecified site of unspecified female breast: Secondary | ICD-10-CM | POA: Diagnosis not present

## 2011-12-05 DIAGNOSIS — Z510 Encounter for antineoplastic radiation therapy: Secondary | ICD-10-CM | POA: Diagnosis not present

## 2011-12-05 DIAGNOSIS — C773 Secondary and unspecified malignant neoplasm of axilla and upper limb lymph nodes: Secondary | ICD-10-CM | POA: Diagnosis not present

## 2011-12-05 DIAGNOSIS — Z170 Estrogen receptor positive status [ER+]: Secondary | ICD-10-CM | POA: Diagnosis not present

## 2011-12-05 DIAGNOSIS — Z79899 Other long term (current) drug therapy: Secondary | ICD-10-CM | POA: Diagnosis not present

## 2011-12-06 ENCOUNTER — Other Ambulatory Visit: Payer: Medicare Other | Admitting: Lab

## 2011-12-06 ENCOUNTER — Ambulatory Visit
Admission: RE | Admit: 2011-12-06 | Discharge: 2011-12-06 | Disposition: A | Discharge status: Home / Self Care | Payer: Medicare Other | Source: Ambulatory Visit | Attending: Radiation Oncology | Admitting: Radiation Oncology

## 2011-12-06 ENCOUNTER — Ambulatory Visit (HOSPITAL_BASED_OUTPATIENT_CLINIC_OR_DEPARTMENT_OTHER): Payer: Medicare Other | Admitting: Oncology

## 2011-12-06 VITALS — BP 107/67 | HR 69 | Temp 97.9°F | Ht 62.0 in | Wt 178.9 lb

## 2011-12-06 DIAGNOSIS — Z9010 Acquired absence of unspecified breast and nipple: Secondary | ICD-10-CM | POA: Diagnosis not present

## 2011-12-06 DIAGNOSIS — Z510 Encounter for antineoplastic radiation therapy: Secondary | ICD-10-CM | POA: Diagnosis not present

## 2011-12-06 DIAGNOSIS — C50919 Malignant neoplasm of unspecified site of unspecified female breast: Secondary | ICD-10-CM

## 2011-12-06 DIAGNOSIS — C773 Secondary and unspecified malignant neoplasm of axilla and upper limb lymph nodes: Secondary | ICD-10-CM | POA: Diagnosis not present

## 2011-12-06 DIAGNOSIS — Z170 Estrogen receptor positive status [ER+]: Secondary | ICD-10-CM | POA: Diagnosis not present

## 2011-12-06 DIAGNOSIS — Z79899 Other long term (current) drug therapy: Secondary | ICD-10-CM | POA: Diagnosis not present

## 2011-12-06 MED ORDER — TAMOXIFEN CITRATE 20 MG PO TABS: 20.0000 mg | ORAL_TABLET | Freq: Every day | ORAL | Status: AC

## 2011-12-06 NOTE — Patient Instructions (Signed)

## 2011-12-06 NOTE — Progress Notes (Signed)
Hematology and Oncology Follow Up Visit  April [name] [id number] 1929/03/18 78 y.o. 12/06/2011 10:37 AM PCP dr Abran Cantor; dr moody; dr Dwain Sarna  Principle Diagnosis: Locally advanced breast cancer on neoadjuvant femara/ arimidex, both with poor tolerance, s/p bil mrm 08/25/11, with residual T2N1 breast cacner , currently on xrt , with 1 week to go.  Interim History:  There have been no intercurrent illness, hospitalizations or medication changes.  Medications: I have reviewed the patient's current medications.  Allergies:  Allergies  Allergen Reactions  . Cefaclor     REACTION: unspecified  . Lansoprazole     REACTION: unspecifie  (prevacid)   . Penicillins     REACTION: unspecified  . Sulfa Drugs Cross Reactors   . Sulfamethoxazole     REACTION: unspecified    Past Medical History, Surgical history, Social history, and Family History were reviewed and updated.  Review of Systems: Constitutional:  Negative for fever, chills, night sweats, anorexia, weight loss, pain. Cardiovascular: negative Respiratory: no cough, shortness of breath, or wheezing Neurological: negative Dermatological: negative ENT: negative Skin Gastrointestinal: no abdominal pain, change in bowel habits, or black or bloody stools Genito-Urinary: negative Hematological and Lymphatic: negative Breast: negative Musculoskeletal: negative Remaining ROS negative.  Physical Exam: Blood pressure 107/67, pulse 69, temperature 97.9 F (36.6 C), height 5\' 2"  (1.575 m), weight 178 lb 14.4 oz (81.149 kg). ECOG: 0 General appearance: alert, cooperative and appears stated age Head: Normocephalic, without obvious abnormality, atraumatic Neck: no adenopathy, no carotid bruit, no JVD, supple, symmetrical, trachea midline and thyroid not enlarged, symmetric, no tenderness/mass/nodules Lymph nodes: Cervical, supraclavicular, and axillary nodes normal. Cardiac : regular rate and rhythm, no murmurs or gallops Pulmonary:clear  to auscultation bilaterally and normal percussion bilaterally Breasts: s/p bil mrm; lt cw exam has diffuse eryhtema, no skin breakdown. Abdomen:soft, non-tender; bowel sounds normal; no masses,  no organomegaly Extremities negative Neuro: alert, oriented, normal speech, no focal findings or movement disorder noted  Lab Results: Lab Results  Component Value Date   WBC 7.1 09/28/2011   HGB 11.6 09/28/2011   HCT 34.3* 09/28/2011   MCV 86.2 09/28/2011   PLT 192 09/28/2011     Chemistry      Component Value Date/Time   NA 138 09/28/2011 0905   K 4.4 09/28/2011 0905   CL 103 09/28/2011 0905   CO2 24 09/28/2011 0905   BUN 19 09/28/2011 0905   CREATININE 0.99 09/28/2011 0905      Component Value Date/Time   CALCIUM 9.6 09/28/2011 0905   ALKPHOS 62 09/28/2011 0905   AST 11 09/28/2011 0905   ALT 11 09/28/2011 0905   BILITOT 0.3 09/28/2011 0905      .pathology. Radiological Studies: chest X-ray n/a Mammogram n/a Bone density n/a  Impression and Plan: April [name] is having fatigue from xrt; i discussed starting tamoxifen when she finishes xrt. I discussed s/e with her. i will see he rin 3 months.  More than 50% of the visit was spent in patient-related counselling   Pierce Crane, MD 1/8/201310:37 AM

## 2011-12-07 ENCOUNTER — Ambulatory Visit
Admission: RE | Admit: 2011-12-07 | Discharge: 2011-12-07 | Disposition: A | Discharge status: Home / Self Care | Payer: Medicare Other | Source: Ambulatory Visit | Attending: Radiation Oncology | Admitting: Radiation Oncology

## 2011-12-07 DIAGNOSIS — C773 Secondary and unspecified malignant neoplasm of axilla and upper limb lymph nodes: Secondary | ICD-10-CM | POA: Diagnosis not present

## 2011-12-07 DIAGNOSIS — Z79899 Other long term (current) drug therapy: Secondary | ICD-10-CM | POA: Diagnosis not present

## 2011-12-07 DIAGNOSIS — Z9010 Acquired absence of unspecified breast and nipple: Secondary | ICD-10-CM | POA: Diagnosis not present

## 2011-12-07 DIAGNOSIS — C50419 Malignant neoplasm of upper-outer quadrant of unspecified female breast: Secondary | ICD-10-CM | POA: Diagnosis not present

## 2011-12-07 DIAGNOSIS — Z510 Encounter for antineoplastic radiation therapy: Secondary | ICD-10-CM | POA: Diagnosis not present

## 2011-12-07 DIAGNOSIS — Z170 Estrogen receptor positive status [ER+]: Secondary | ICD-10-CM | POA: Diagnosis not present

## 2011-12-07 DIAGNOSIS — C50919 Malignant neoplasm of unspecified site of unspecified female breast: Secondary | ICD-10-CM | POA: Diagnosis not present

## 2011-12-08 ENCOUNTER — Ambulatory Visit
Admission: RE | Admit: 2011-12-08 | Discharge: 2011-12-08 | Disposition: A | Discharge status: Home / Self Care | Payer: Medicare Other | Source: Ambulatory Visit | Attending: Radiation Oncology | Admitting: Radiation Oncology

## 2011-12-08 DIAGNOSIS — Z9010 Acquired absence of unspecified breast and nipple: Secondary | ICD-10-CM | POA: Diagnosis not present

## 2011-12-08 DIAGNOSIS — Z79899 Other long term (current) drug therapy: Secondary | ICD-10-CM | POA: Diagnosis not present

## 2011-12-08 DIAGNOSIS — Z510 Encounter for antineoplastic radiation therapy: Secondary | ICD-10-CM | POA: Diagnosis not present

## 2011-12-08 DIAGNOSIS — C773 Secondary and unspecified malignant neoplasm of axilla and upper limb lymph nodes: Secondary | ICD-10-CM | POA: Diagnosis not present

## 2011-12-08 DIAGNOSIS — C50919 Malignant neoplasm of unspecified site of unspecified female breast: Secondary | ICD-10-CM | POA: Diagnosis not present

## 2011-12-08 DIAGNOSIS — Z170 Estrogen receptor positive status [ER+]: Secondary | ICD-10-CM | POA: Diagnosis not present

## 2011-12-09 ENCOUNTER — Ambulatory Visit
Admission: RE | Admit: 2011-12-09 | Discharge: 2011-12-09 | Disposition: A | Discharge status: Home / Self Care | Payer: Medicare Other | Source: Ambulatory Visit | Attending: Radiation Oncology | Admitting: Radiation Oncology

## 2011-12-09 ENCOUNTER — Telehealth: Payer: Self-pay | Admitting: *Deleted

## 2011-12-09 ENCOUNTER — Encounter: Payer: Self-pay | Admitting: Radiation Oncology

## 2011-12-09 DIAGNOSIS — C50919 Malignant neoplasm of unspecified site of unspecified female breast: Secondary | ICD-10-CM | POA: Diagnosis not present

## 2011-12-09 DIAGNOSIS — Z79899 Other long term (current) drug therapy: Secondary | ICD-10-CM | POA: Diagnosis not present

## 2011-12-09 DIAGNOSIS — Z510 Encounter for antineoplastic radiation therapy: Secondary | ICD-10-CM | POA: Diagnosis not present

## 2011-12-09 DIAGNOSIS — C50419 Malignant neoplasm of upper-outer quadrant of unspecified female breast: Secondary | ICD-10-CM

## 2011-12-09 DIAGNOSIS — C773 Secondary and unspecified malignant neoplasm of axilla and upper limb lymph nodes: Secondary | ICD-10-CM | POA: Diagnosis not present

## 2011-12-09 DIAGNOSIS — Z170 Estrogen receptor positive status [ER+]: Secondary | ICD-10-CM | POA: Diagnosis not present

## 2011-12-09 DIAGNOSIS — Z9010 Acquired absence of unspecified breast and nipple: Secondary | ICD-10-CM | POA: Diagnosis not present

## 2011-12-09 NOTE — Progress Notes (Signed)
Pt to start tamoxifen when radiation completed, 5 more treatments, pt left breast area and back with erythema,skin rash like, peeling, at places,dry, pt c/o slight burning /pain in the affected area, doesn't last long stated pt 3:42 PM

## 2011-12-09 NOTE — Telephone Encounter (Signed)
Pt. Called b/c she cannot remember what Dr. Donnie Coffin said about starting her tamoxifen.  Reviewed his note from offfice visit 12/06/11 and she is to start when she finishes radiation.  He will see her in 3 months.  Pt. Appreciated the call back.

## 2011-12-09 NOTE — Progress Notes (Signed)
[facility] Radiation Oncology Weekly Treatment Note    Name: April [name] Date: 12/09/2011 MRN: [id number] DOB: September 02, 1929  Status: outpatient    Current dose: 5040  Current fraction:28  Planned dose:6040  Planned fraction:33   MEDICATIONS: Current Outpatient Prescriptions  Medication Sig Dispense Refill  . aspirin 81 MG tablet Take 81 mg by mouth daily.        . Cholecalciferol (VITAMIN D) 1000 UNITS capsule Take 1,000 Units by mouth daily. 12/06/11- Pt reports that she does not take all the time.      . diclofenac (VOLTAREN) 50 MG EC tablet Take 50 mg by mouth 3 (three) times daily. Prn pain       . lisinopril (PRINIVIL,ZESTRIL) 20 MG tablet TAKE 1 TABLET ONCE DAILY.  30 tablet  5  . PRILOSEC 40 MG capsule TAKE 1 CAPSULE EVERY DAY.  30 each  11  . Psyllium (METAMUCIL) 48.57 % POWD Take by mouth daily. As directed       . tamoxifen (NOLVADEX) 20 MG tablet Take 1 tablet (20 mg total) by mouth daily.  30 tablet  6     ALLERGIES: Cefaclor; Lansoprazole; Penicillins; Sulfa drugs cross reactors; and Sulfamethoxazole   LABORATORY DATA:  Lab Results  Component Value Date   WBC 7.1 09/28/2011   HGB 11.6 09/28/2011   HCT 34.3* 09/28/2011   MCV 86.2 09/28/2011   PLT 192 09/28/2011   Lab Results  Component Value Date   NA 138 09/28/2011   K 4.4 09/28/2011   CL 103 09/28/2011   CO2 24 09/28/2011   Lab Results  Component Value Date   ALT 11 09/28/2011   AST 11 09/28/2011   ALKPHOS 62 09/28/2011   BILITOT 0.3 09/28/2011      NARRATIVE: April [name] was seen today for weekly treatment management. The chart was checked and port films images were reviewed. Pt doing well. Less fatigue this week. States skin does not cause discomfort.  PHYSICAL EXAMINATION: vitals were not taken for this visit.    Skin holding up well; erythema, no moist desquamation  ASSESSMENT: Patient tolerating treatments well.    PLAN: Continue treatment as planned. Will start boost on  Monday.

## 2011-12-12 ENCOUNTER — Ambulatory Visit
Admission: RE | Admit: 2011-12-12 | Discharge: 2011-12-12 | Disposition: A | Discharge status: Home / Self Care | Payer: Medicare Other | Source: Ambulatory Visit | Attending: Radiation Oncology | Admitting: Radiation Oncology

## 2011-12-12 DIAGNOSIS — Z170 Estrogen receptor positive status [ER+]: Secondary | ICD-10-CM | POA: Diagnosis not present

## 2011-12-12 DIAGNOSIS — Z79899 Other long term (current) drug therapy: Secondary | ICD-10-CM | POA: Diagnosis not present

## 2011-12-12 DIAGNOSIS — C773 Secondary and unspecified malignant neoplasm of axilla and upper limb lymph nodes: Secondary | ICD-10-CM | POA: Diagnosis not present

## 2011-12-12 DIAGNOSIS — C50919 Malignant neoplasm of unspecified site of unspecified female breast: Secondary | ICD-10-CM | POA: Diagnosis not present

## 2011-12-12 DIAGNOSIS — Z9010 Acquired absence of unspecified breast and nipple: Secondary | ICD-10-CM | POA: Diagnosis not present

## 2011-12-12 DIAGNOSIS — C50419 Malignant neoplasm of upper-outer quadrant of unspecified female breast: Secondary | ICD-10-CM | POA: Diagnosis not present

## 2011-12-12 DIAGNOSIS — Z510 Encounter for antineoplastic radiation therapy: Secondary | ICD-10-CM | POA: Diagnosis not present

## 2011-12-13 ENCOUNTER — Ambulatory Visit
Admission: RE | Admit: 2011-12-13 | Discharge: 2011-12-13 | Disposition: A | Discharge status: Home / Self Care | Payer: Medicare Other | Source: Ambulatory Visit | Attending: Radiation Oncology | Admitting: Radiation Oncology

## 2011-12-13 DIAGNOSIS — C773 Secondary and unspecified malignant neoplasm of axilla and upper limb lymph nodes: Secondary | ICD-10-CM | POA: Diagnosis not present

## 2011-12-13 DIAGNOSIS — C50919 Malignant neoplasm of unspecified site of unspecified female breast: Secondary | ICD-10-CM | POA: Diagnosis not present

## 2011-12-13 DIAGNOSIS — Z9010 Acquired absence of unspecified breast and nipple: Secondary | ICD-10-CM | POA: Diagnosis not present

## 2011-12-13 DIAGNOSIS — Z510 Encounter for antineoplastic radiation therapy: Secondary | ICD-10-CM | POA: Diagnosis not present

## 2011-12-13 DIAGNOSIS — Z170 Estrogen receptor positive status [ER+]: Secondary | ICD-10-CM | POA: Diagnosis not present

## 2011-12-13 DIAGNOSIS — Z79899 Other long term (current) drug therapy: Secondary | ICD-10-CM | POA: Diagnosis not present

## 2011-12-14 ENCOUNTER — Ambulatory Visit
Admission: RE | Admit: 2011-12-14 | Discharge: 2011-12-14 | Disposition: A | Discharge status: Home / Self Care | Payer: Medicare Other | Source: Ambulatory Visit | Attending: Radiation Oncology | Admitting: Radiation Oncology

## 2011-12-14 DIAGNOSIS — Z510 Encounter for antineoplastic radiation therapy: Secondary | ICD-10-CM | POA: Diagnosis not present

## 2011-12-14 DIAGNOSIS — Z9010 Acquired absence of unspecified breast and nipple: Secondary | ICD-10-CM | POA: Diagnosis not present

## 2011-12-14 DIAGNOSIS — Z79899 Other long term (current) drug therapy: Secondary | ICD-10-CM | POA: Diagnosis not present

## 2011-12-14 DIAGNOSIS — C773 Secondary and unspecified malignant neoplasm of axilla and upper limb lymph nodes: Secondary | ICD-10-CM | POA: Diagnosis not present

## 2011-12-14 DIAGNOSIS — Z170 Estrogen receptor positive status [ER+]: Secondary | ICD-10-CM | POA: Diagnosis not present

## 2011-12-14 DIAGNOSIS — C50919 Malignant neoplasm of unspecified site of unspecified female breast: Secondary | ICD-10-CM | POA: Diagnosis not present

## 2011-12-14 DIAGNOSIS — C50419 Malignant neoplasm of upper-outer quadrant of unspecified female breast: Secondary | ICD-10-CM | POA: Diagnosis not present

## 2011-12-15 ENCOUNTER — Encounter: Payer: Self-pay | Admitting: Radiation Oncology

## 2011-12-15 ENCOUNTER — Ambulatory Visit
Admission: RE | Admit: 2011-12-15 | Discharge: 2011-12-15 | Disposition: A | Discharge status: Home / Self Care | Payer: Medicare Other | Source: Ambulatory Visit | Attending: Radiation Oncology | Admitting: Radiation Oncology

## 2011-12-15 VITALS — Wt 176.2 lb

## 2011-12-15 DIAGNOSIS — C773 Secondary and unspecified malignant neoplasm of axilla and upper limb lymph nodes: Secondary | ICD-10-CM | POA: Diagnosis not present

## 2011-12-15 DIAGNOSIS — Z9010 Acquired absence of unspecified breast and nipple: Secondary | ICD-10-CM | POA: Diagnosis not present

## 2011-12-15 DIAGNOSIS — C50419 Malignant neoplasm of upper-outer quadrant of unspecified female breast: Secondary | ICD-10-CM

## 2011-12-15 DIAGNOSIS — Z510 Encounter for antineoplastic radiation therapy: Secondary | ICD-10-CM | POA: Diagnosis not present

## 2011-12-15 DIAGNOSIS — C50919 Malignant neoplasm of unspecified site of unspecified female breast: Secondary | ICD-10-CM | POA: Diagnosis not present

## 2011-12-15 DIAGNOSIS — Z170 Estrogen receptor positive status [ER+]: Secondary | ICD-10-CM | POA: Diagnosis not present

## 2011-12-15 DIAGNOSIS — Z79899 Other long term (current) drug therapy: Secondary | ICD-10-CM | POA: Diagnosis not present

## 2011-12-15 NOTE — Progress Notes (Signed)
Pt completes tomorrow. Dry desquamation L chest wall, applying Radiaplex 2-3 x/day. No pain but states skin "feels tight". Gave pt 1 month FU card.

## 2011-12-15 NOTE — Progress Notes (Signed)
[facility] Radiation Oncology Weekly Treatment Note    Name: April [name] Date: 12/15/2011 MRN: [id number] DOB: Apr 02, 1929  Status: outpatient    Current dose: 5840  Current fraction:32  Planned dose:6040  Planned fraction:33   MEDICATIONS: Current Outpatient Prescriptions  Medication Sig Dispense Refill  . aspirin 81 MG tablet Take 81 mg by mouth daily.        . Cholecalciferol (VITAMIN D) 1000 UNITS capsule Take 1,000 Units by mouth daily. 12/06/11- Pt reports that she does not take all the time.      . diclofenac (VOLTAREN) 50 MG EC tablet Take 50 mg by mouth 3 (three) times daily. Prn pain       . lisinopril (PRINIVIL,ZESTRIL) 20 MG tablet TAKE 1 TABLET ONCE DAILY.  30 tablet  5  . PRILOSEC 40 MG capsule TAKE 1 CAPSULE EVERY DAY.  30 each  11  . Psyllium (METAMUCIL) 48.57 % POWD Take by mouth daily. As directed       . tamoxifen (NOLVADEX) 20 MG tablet Take 1 tablet (20 mg total) by mouth daily.  30 tablet  6     ALLERGIES: Cefaclor; Lansoprazole; Penicillins; Sulfa drugs cross reactors; and Sulfamethoxazole   LABORATORY DATA:  Lab Results  Component Value Date   WBC 7.1 09/28/2011   HGB 11.6 09/28/2011   HCT 34.3* 09/28/2011   MCV 86.2 09/28/2011   PLT 192 09/28/2011   Lab Results  Component Value Date   NA 138 09/28/2011   K 4.4 09/28/2011   CL 103 09/28/2011   CO2 24 09/28/2011   Lab Results  Component Value Date   ALT 11 09/28/2011   AST 11 09/28/2011   ALKPHOS 62 09/28/2011   BILITOT 0.3 09/28/2011      NARRATIVE: Fritzie B Glantz was seen today for weekly treatment management. The chart was checked and port films images were reviewed. Pt doing well. Some fatigue. Sclv area healing.  PHYSICAL EXAMINATION: weight is 176 lb 3.2 oz (79.924 kg).       ASSESSMENT: Patient tolerating treatments well.    PLAN: Continue treatment as planned.

## 2011-12-16 ENCOUNTER — Ambulatory Visit
Admission: RE | Admit: 2011-12-16 | Discharge: 2011-12-16 | Disposition: A | Discharge status: Home / Self Care | Payer: Medicare Other | Source: Ambulatory Visit | Attending: Radiation Oncology | Admitting: Radiation Oncology

## 2011-12-16 DIAGNOSIS — Z510 Encounter for antineoplastic radiation therapy: Secondary | ICD-10-CM | POA: Diagnosis not present

## 2011-12-16 DIAGNOSIS — Z9010 Acquired absence of unspecified breast and nipple: Secondary | ICD-10-CM | POA: Diagnosis not present

## 2011-12-16 DIAGNOSIS — C773 Secondary and unspecified malignant neoplasm of axilla and upper limb lymph nodes: Secondary | ICD-10-CM | POA: Diagnosis not present

## 2011-12-16 DIAGNOSIS — Z79899 Other long term (current) drug therapy: Secondary | ICD-10-CM | POA: Diagnosis not present

## 2011-12-16 DIAGNOSIS — C50919 Malignant neoplasm of unspecified site of unspecified female breast: Secondary | ICD-10-CM | POA: Diagnosis not present

## 2011-12-16 DIAGNOSIS — C50419 Malignant neoplasm of upper-outer quadrant of unspecified female breast: Secondary | ICD-10-CM

## 2011-12-16 DIAGNOSIS — Z170 Estrogen receptor positive status [ER+]: Secondary | ICD-10-CM | POA: Diagnosis not present

## 2011-12-23 ENCOUNTER — Telehealth: Payer: Self-pay | Admitting: *Deleted

## 2011-12-23 NOTE — Telephone Encounter (Signed)
Returned pt's call; she states last night she noticed tan drainage in her bra w/possibly a slight amount of blood mixed in. She states there was a foul odor. Pt denies fever, tenderness, pain, redness, warmth of breast either last night or this morning. She states there has been no drainage since. Pt completed tx to L breast on 12/16/11. Informed pt will discuss w/Dr Mitzi Hansen and call her back. Pt states she cannot come in today due to bad weather; requests to possibly be seen Monday, 12/26/11.

## 2011-12-26 ENCOUNTER — Telehealth: Payer: Self-pay | Admitting: *Deleted

## 2011-12-26 NOTE — Telephone Encounter (Signed)
Returned pt's call this morning; she states she cont to have intermittent yellow drainage from L breast, unsure of source. She states it happens when she is up during the day and is on her bra. She occass has fleeting pain in breast, reports redness near the axilla. She denies warmth of breast or fever. Informed pt will speak w/Dr Mitzi Hansen and call her back. Pt verbalized understanding.  2:35pm Spoke w/Dr Mitzi Hansen; per dr, pt to be reassured that dr does not feel this is worrisome since there is no visible sourse of drainage, and if she still desires FU will schedule her for this Wed or Thurs per Dr Mitzi Hansen.  Called pt who wishes to be seen Wed. Requested Bjorn Loser, medical secretary schedule pt for 10:30 am FU Wed, 12/28/11.

## 2011-12-27 DIAGNOSIS — H2510 Age-related nuclear cataract, unspecified eye: Secondary | ICD-10-CM | POA: Diagnosis not present

## 2011-12-27 DIAGNOSIS — H25049 Posterior subcapsular polar age-related cataract, unspecified eye: Secondary | ICD-10-CM | POA: Diagnosis not present

## 2011-12-27 NOTE — Progress Notes (Signed)
CC:   April Gosling, MD Pierce Crane, M.D., F.R.C.P.C. Tera Mater. Clent Ridges, MD  DIAGNOSIS:  Bilateral breast cancer.  TREATMENT DATES:  10/26/2011 to 12/16/2011.  SITE AND DOSE:  April [name] was treated with a course of postmastectomy radiation on the left.  She initially received a course of treatment corresponding to 50.4 Gy in 28 fractions at 1.8 Gy per fraction, and this was given to the left chest wall and left supraclavicular region. A 3 field technique was used with both 10 and 6 MV photons.  The patient then received a boost to the scar plus margin using a single en face electron field and 6 MeV electrons.  The patient's total dose was 60.4 Gy after a 10 Gy boost.  NARRATIVE:  April [name] did satisfactorily during the course of her radiation treatment.  She did have some skin irritation as expected, but overall her skin looked quite good at the end of treatment.  FOLLOWUP APPOINTMENT:  1 month.    ______________________________ Radene Gunning, M.D., Ph.D. JSM/MEDQ  D:  12/27/2011  T:  12/27/2011  Job:  [id number]

## 2011-12-27 NOTE — Progress Notes (Signed)
DIAGNOSIS:  Breast cancer.  NARRATIVE:  Ms. Dicker presented for her 1st boost treatment for her left- sided breast cancer.  To ensure adequate dose to the skin, 0.5 cm of bolus will be used on a daily basis for the remainder of her treatment. Therefore this depth of bolus was fashioned and this represents a simple treatment device for the remainder of her course.    ______________________________ Radene Gunning, M.D., Ph.D. JSM/MEDQ  D:  12/27/2011  T:  12/27/2011  Job:  [id number]

## 2011-12-28 ENCOUNTER — Ambulatory Visit
Admission: RE | Admit: 2011-12-28 | Discharge: 2011-12-28 | Disposition: A | Discharge status: Home / Self Care | Payer: Medicare Other | Source: Ambulatory Visit | Attending: Radiation Oncology | Admitting: Radiation Oncology

## 2011-12-28 ENCOUNTER — Encounter: Payer: Self-pay | Admitting: Radiation Oncology

## 2011-12-28 VITALS — BP 129/64 | HR 80 | Temp 98.7°F | Resp 20 | Wt 178.4 lb

## 2011-12-28 DIAGNOSIS — C50419 Malignant neoplasm of upper-outer quadrant of unspecified female breast: Secondary | ICD-10-CM

## 2011-12-28 NOTE — Progress Notes (Signed)
Encounter addended by: Glennie Hawk, RN on: 12/28/2011  2:24 PM<BR>     Documentation filed: Notes Section

## 2011-12-28 NOTE — Progress Notes (Signed)
Per Dr Mitzi Hansen, applied Neosporin to moist desquamation area of pt's L chest wall area, dressed  w/gauze, paper tape. Pt instructed to bathe area gently, apply Neosporin and gauze daily. Pt understands to call office if she develops more drainage, drainage becomes greenish, has foul odor, she develops fever, or pain in this area. Pt verbalized understanding of all instructions. Gave pt supplies, foam pads to place in L axilla for comfort.

## 2011-12-28 NOTE — Progress Notes (Signed)
Pt cont to have small amt yellow-green drainage from lower L chest wall in radiation tx area. No odor noted today; pt states only had odor last week when she had first occurrence of large amt "pus-like" drainage. Pt denies pain. Area w/dry desquamation, hyperpigmentation, very few areas of moist desquamation. Area warm, w/some edema noted.  Pt stopped applying Radiaplex to area except on upper chest wall. Has only been lightly bathing area.

## 2011-12-30 NOTE — Progress Notes (Signed)
CC:   Pierce Crane, M.D., F.R.C.P.C. Juanetta Gosling, MD  DIAGNOSIS:  Breast cancer.  NARRATIVE:  Ms. Gehrig comes in today after finishing her course of radiation approximately 2 weeks ago for a skin check.  She had noted some drainage from the left chest wall region, and she felt that this was associated with some possible pus and an odor.  This has been improving over the last couple of days she states but she did want to be seen.  She notes no significant unexpected discomfort or pain in this area or elsewhere and denies any fever or other related complaints.  PHYSICAL EXAMINATION:  Diffuse erythema and dryness of skin is present in the chest wall treatment area.  There is some continued desquamation but I do not really see any moist desquamation at this time.  There is an area along the mastectomy scar medially which is more irritated and it appears that the small amount of drainage is coming from this area. I do not see any increased erythema or redness in this area beyond what is present diffusely from the treatment.  It looks like some of the overlying superficial skin has been peeled off, possibly with tape which she has been using with some gauze.  In short, therefore, it appears that the patient is continuing to heal in general as I would expect and I do not see any focal problems.  IMPRESSION AND PLAN:  Ms. Castrillo is continuing to recover from treatment. I believe this is going to continue to heal up okay and I do not see the need to put her on anything at this time.  We will give her some gauze and some paper tape, which I think will be more gentle, and we also directed her to use some Neosporin on the area of greatest concern.  The patient does know to call us if there is any change or worsening, and we will keep her appointment, which is previously scheduled, to check her skin once again in approximately 10 days to 2 weeks.    ______________________________ Radene Gunning, M.D., Ph.D. JSM/MEDQ  D:  12/28/2011  T:  12/30/2011  Job:  [id number]

## 2012-01-16 ENCOUNTER — Encounter: Payer: Self-pay | Admitting: *Deleted

## 2012-01-16 DIAGNOSIS — C50912 Malignant neoplasm of unspecified site of left female breast: Secondary | ICD-10-CM | POA: Insufficient documentation

## 2012-01-16 DIAGNOSIS — Z923 Personal history of irradiation: Secondary | ICD-10-CM | POA: Insufficient documentation

## 2012-01-19 ENCOUNTER — Encounter: Payer: Self-pay | Admitting: Radiation Oncology

## 2012-01-20 ENCOUNTER — Ambulatory Visit
Admission: RE | Admit: 2012-01-20 | Discharge: 2012-01-20 | Disposition: A | Discharge status: Home / Self Care | Payer: Medicare Other | Source: Ambulatory Visit | Attending: Radiation Oncology | Admitting: Radiation Oncology

## 2012-01-20 ENCOUNTER — Encounter: Payer: Self-pay | Admitting: Radiation Oncology

## 2012-01-20 VITALS — BP 112/70 | HR 71 | Temp 98.5°F | Resp 20 | Wt 177.4 lb

## 2012-01-20 DIAGNOSIS — C50919 Malignant neoplasm of unspecified site of unspecified female breast: Secondary | ICD-10-CM

## 2012-01-20 NOTE — Progress Notes (Signed)
Pt states very rare pains in chest wall. Skin in tx area pale pink; pt still applying Radiaplex. Advised she begin lotion w/vit E when Radiaplex finished.

## 2012-01-23 ENCOUNTER — Ambulatory Visit (INDEPENDENT_AMBULATORY_CARE_PROVIDER_SITE_OTHER): Payer: Medicare Other | Admitting: Family Medicine

## 2012-01-23 ENCOUNTER — Encounter: Payer: Self-pay | Admitting: Family Medicine

## 2012-01-23 VITALS — BP 104/52 | HR 77 | Temp 98.1°F | Wt 171.0 lb

## 2012-01-23 DIAGNOSIS — B9789 Other viral agents as the cause of diseases classified elsewhere: Secondary | ICD-10-CM | POA: Diagnosis not present

## 2012-01-23 DIAGNOSIS — B349 Viral infection, unspecified: Secondary | ICD-10-CM

## 2012-01-23 MED ORDER — PROMETHAZINE HCL 25 MG PO TABS: 25.0000 mg | ORAL_TABLET | Freq: Four times a day (QID) | ORAL | Status: AC | PRN

## 2012-01-23 NOTE — Progress Notes (Signed)
  Subjective:    Patient ID: Roseanne Kaufman, female    DOB: [birth year], 78 y.o.   MRN: [id number]  HPI Here for 2 days of fatigue, low grade fever, ST, a dry cough, and nausea. Drinking fluids.    Review of Systems  Constitutional: Positive for fever and fatigue.  HENT: Positive for congestion and postnasal drip.   Eyes: Negative.   Respiratory: Positive for cough.   Gastrointestinal: Positive for nausea. Negative for vomiting and abdominal pain.       Objective:   Physical Exam  Constitutional: She appears well-developed and well-nourished.  HENT:  Right Ear: External ear normal.  Left Ear: External ear normal.  Nose: Nose normal.  Mouth/Throat: Oropharynx is clear and moist. No oropharyngeal exudate.  Eyes: Conjunctivae are normal.  Neck: Neck supple.  Pulmonary/Chest: Effort normal and breath sounds normal.  Abdominal: Soft. Bowel sounds are normal. She exhibits no distension and no mass. There is no tenderness. There is no rebound and no guarding.  Lymphadenopathy:    She has no cervical adenopathy.          Assessment & Plan:  Recheck prn

## 2012-01-25 NOTE — Progress Notes (Signed)
DIAGNOSIS:  Left-sided breast cancer.  NARRATIVE:  [name] completed her course of radiation on 12/16/2011. She comes today for a skin check.  She indicates that she has done well with this regards, and that her skin has continued to heal.  She is still applying RadiaPlex.  PHYSICAL EXAMINATION:  Some diffuse erythema remains in the treatment area, but overall this looks quite good.  This has healed well since she was last seen, and there is no drainage.  IMPRESSION AND PLAN:  [name] is doing well at this time, and her skin continues to heal.  We recommended adding some vitamin E oil after RadiaPlex is finished, and otherwise we will have her return to our clinic on a p.r.n. basis.    ______________________________ Radene Gunning, M.D., Ph.D. JSM/MEDQ  D:  01/24/2012  T:  01/25/2012  Job:  [id number]

## 2012-01-27 ENCOUNTER — Encounter: Payer: Self-pay | Admitting: Family

## 2012-01-27 ENCOUNTER — Ambulatory Visit (INDEPENDENT_AMBULATORY_CARE_PROVIDER_SITE_OTHER)
Admission: RE | Admit: 2012-01-27 | Discharge: 2012-01-27 | Disposition: A | Discharge status: Home / Self Care | Payer: Medicare Other | Source: Ambulatory Visit | Attending: Family | Admitting: Family

## 2012-01-27 ENCOUNTER — Ambulatory Visit (INDEPENDENT_AMBULATORY_CARE_PROVIDER_SITE_OTHER): Payer: Medicare Other | Admitting: Family

## 2012-01-27 ENCOUNTER — Other Ambulatory Visit: Payer: Medicare Other

## 2012-01-27 VITALS — BP 130/64 | Temp 98.3°F | Wt 170.0 lb

## 2012-01-27 DIAGNOSIS — M545 Low back pain, unspecified: Secondary | ICD-10-CM | POA: Diagnosis not present

## 2012-01-27 DIAGNOSIS — R300 Dysuria: Secondary | ICD-10-CM | POA: Diagnosis not present

## 2012-01-27 DIAGNOSIS — Z853 Personal history of malignant neoplasm of breast: Secondary | ICD-10-CM | POA: Diagnosis not present

## 2012-01-27 DIAGNOSIS — M5137 Other intervertebral disc degeneration, lumbosacral region: Secondary | ICD-10-CM | POA: Diagnosis not present

## 2012-01-27 DIAGNOSIS — M47817 Spondylosis without myelopathy or radiculopathy, lumbosacral region: Secondary | ICD-10-CM | POA: Diagnosis not present

## 2012-01-27 LAB — POCT URINALYSIS DIPSTICK
Bilirubin, UA: NEGATIVE
Ketones, UA: NEGATIVE
Protein, UA: NEGATIVE
Spec Grav, UA: <=1.005

## 2012-01-27 MED ORDER — CIPROFLOXACIN HCL 250 MG PO TABS: 250.0000 mg | ORAL_TABLET | Freq: Two times a day (BID) | ORAL | Status: AC

## 2012-01-27 NOTE — Progress Notes (Signed)
Subjective:    Patient ID: April [name], female    DOB: 1929-10-25, 78 y.o.   MRN: [id number]  HPI 78 year old white female, nonsmoker, patient of Dr. Clent Ridges is in today with complaints of low back pain and urinary frequency tomorrow for 2 days. The pain is not particularly worse with movement. She has had some associated nausea. She was here earlier this week with upper respiratory symptoms that she believes have resolved. She denies any abdominal pain, no vomiting, diarrhea, constipation, blood in her stools or dark black stools. She denies any blood in her urine or foul-smelling urine.   Review of Systems  Constitutional: Positive for fatigue.  HENT: Negative.   Respiratory: Negative.   Cardiovascular: Negative.  Negative for chest pain and leg swelling.  Gastrointestinal: Positive for nausea. Negative for vomiting, abdominal pain, diarrhea and abdominal distention.  Genitourinary: Positive for frequency. Negative for vaginal bleeding and vaginal discharge.  Musculoskeletal: Negative.   Skin: Negative.   Hematological: Negative.   Psychiatric/Behavioral: Negative.        Past Medical History  Diagnosis Date  . HYPERTENSION 09/30/2009  . GERD 04/18/2007  . LOW BACK PAIN SYNDROME 07/13/2009  . ROTATOR CUFF SYNDROME 05/06/2010  . BURSITIS 01/04/2008  . COSTOCHONDRITIS 11/05/2007  . ALLERGIC RHINITIS 04/18/2007  . Melanoma     removed from upper back  . Breast cancer 2012     Dr. Donnie Coffin, ER/PR positive L breast  . Hx of radiation therapy 10/26/11 to 12/16/11    L chest wall, L supraclavicular region    History   Social History  . Marital Status: Married    Spouse Name: N/A    Number of Children: N/A  . Years of Education: N/A   Occupational History  . Not on file.   Social History Main Topics  . Smoking status: Never Smoker   . Smokeless tobacco: Never Used  . Alcohol Use: Yes  . Drug Use: No  . Sexually Active: Not on file   Other Topics Concern  . Not on file   Social  History Narrative  . No narrative on file    Past Surgical History  Procedure Date  . Tonsillectomy and adenoidectomy   . Breast surgery 1968    left bx - noncancerous  . Joint replacement     left knee  . Excision of back melanoma   . Mastectomy 08/25/11    bilateral , per Dr. Dwain Sarna  . Sentinel lymph node biopsy 08/25/11    bilateral  . Cholecystectomy 1979  . Ankle surgery     right    Family History  Problem Relation Age of Onset  . Stroke Father   . Cancer Neg Hx     family - thyroid  . Alcohol abuse Sister   . Thyroid cancer Mother     Allergies  Allergen Reactions  . Cefaclor     REACTION: unspecified  . Lansoprazole     REACTION: unspecifie  (prevacid)   . Penicillins     REACTION: unspecified  . Sulfa Drugs Cross Reactors   . Sulfamethoxazole     REACTION: unspecified    Current Outpatient Prescriptions on File Prior to Visit  Medication Sig Dispense Refill  . aspirin 81 MG tablet Take 81 mg by mouth daily.        . Cholecalciferol (VITAMIN D) 1000 UNITS capsule Take 1,000 Units by mouth daily. 12/06/11- Pt reports that she does not take all the time.      Marland Kitchen  diclofenac (VOLTAREN) 50 MG EC tablet Take 50 mg by mouth 3 (three) times daily. Prn pain       . lisinopril (PRINIVIL,ZESTRIL) 20 MG tablet TAKE 1 TABLET ONCE DAILY.  30 tablet  5  . PRILOSEC 40 MG capsule TAKE 1 CAPSULE EVERY DAY.  30 each  11  . promethazine (PHENERGAN) 25 MG tablet Take 1 tablet (25 mg total) by mouth every 6 (six) hours as needed for nausea.  60 tablet  5  . Psyllium (METAMUCIL) 48.57 % POWD Take by mouth daily. As directed       . tamoxifen (NOLVADEX) 20 MG tablet Take 20 mg by mouth daily.        BP 130/64  Temp(Src) 98.3 F (36.8 C) (Oral)  Wt 170 lb (77.111 kg)chart  Objective:   Physical Exam  Constitutional: She is oriented to person, place, and time. She appears well-developed and well-nourished.  Neck: Normal range of motion. Neck supple.  Cardiovascular: Normal  rate, regular rhythm and normal heart sounds.   Pulmonary/Chest: Effort normal and breath sounds normal.  Abdominal: Soft. Bowel sounds are normal. There is no tenderness. There is no rebound and no guarding.  Musculoskeletal: Normal range of motion.  Neurological: She is alert and oriented to person, place, and time.  Skin: Skin is warm and dry.  Psychiatric: She has a normal mood and affect.          Assessment & Plan:  Assessment: Low back pain, dysuria  Plan: Urine culture sent. X-ray of the L-spine. Empirically we'll treat with Cipro 250 mg one tablet twice daily x3 days. We'll follow with patient the results of her x-ray and urine culture. Call the office sooner with any questions or concerns.

## 2012-01-27 NOTE — Progress Notes (Signed)
Addended by: Beverely Low on: 01/27/2012 11:28 AM   Modules accepted: Orders

## 2012-01-27 NOTE — Patient Instructions (Signed)
Low back pain Back Pain, Adult Low back pain is very common. About 1 in 5 people have back pain.The cause of low back pain is rarely dangerous. The pain often gets better over time.About half of people with a sudden onset of back pain feel better in just 2 weeks. About 8 in 10 people feel better by 6 weeks.  CAUSES Some common causes of back pain include:  Strain of the muscles or ligaments supporting the spine.   Wear and tear (degeneration) of the spinal discs.   Arthritis.   Direct injury to the back.  DIAGNOSIS Most of the time, the direct cause of low back pain is not known.However, back pain can be treated effectively even when the exact cause of the pain is unknown.Answering your caregiver's questions about your overall health and symptoms is one of the most accurate ways to make sure the cause of your pain is not dangerous. If your caregiver needs more information, he or she may order lab work or imaging tests (X-rays or MRIs).However, even if imaging tests show changes in your back, this usually does not require surgery. HOME CARE INSTRUCTIONS For many people, back pain returns.Since low back pain is rarely dangerous, it is often a condition that people can learn to [facility] their own.   Remain active. It is stressful on the back to sit or stand in one place. Do not sit, drive, or stand in one place for more than 30 minutes at a time. Take short walks on level surfaces as soon as pain allows.Try to increase the length of time you walk each day.   Do not stay in bed.Resting more than 1 or 2 days can delay your recovery.   Do not avoid exercise or work.Your body is made to move.It is not dangerous to be active, even though your back may hurt.Your back will likely heal faster if you return to being active before your pain is gone.   Pay attention to your body when you bend and lift. Many people have less discomfortwhen lifting if they bend their knees, keep the load close  to their bodies,and avoid twisting. Often, the most comfortable positions are those that put less stress on your recovering back.   Find a comfortable position to sleep. Use a firm mattress and lie on your side with your knees slightly bent. If you lie on your back, put a pillow under your knees.   Only take over-the-counter or prescription medicines as directed by your caregiver. Over-the-counter medicines to reduce pain and inflammation are often the most helpful.Your caregiver may prescribe muscle relaxant drugs.These medicines help dull your pain so you can more quickly return to your normal activities and healthy exercise.   Put ice on the injured area.   Put ice in a plastic bag.   Place a towel between your skin and the bag.   Leave the ice on for 15 to 20 minutes, 3 to 4 times a day for the first 2 to 3 days. After that, ice and heat may be alternated to reduce pain and spasms.   Ask your caregiver about trying back exercises and gentle massage. This may be of some benefit.   Avoid feeling anxious or stressed.Stress increases muscle tension and can worsen back pain.It is important to recognize when you are anxious or stressed and learn ways to manage it.Exercise is a great option.  SEEK MEDICAL CARE IF:  You have pain that is not relieved with rest or medicine.  You have pain that does not improve in 1 week.   You have new symptoms.   You are generally not feeling well.  SEEK IMMEDIATE MEDICAL CARE IF:   You have pain that radiates from your back into your legs.   You develop new bowel or bladder control problems.   You have unusual weakness or numbness in your arms or legs.   You develop nausea or vomiting.   You develop abdominal pain.   You feel faint.  Document Released: 11/14/2005 Document Revised: 07/27/2011 Document Reviewed: 04/04/2011 [facility] Patient Information 2012 [place], [place].

## 2012-01-28 LAB — URINE CULTURE

## 2012-01-31 ENCOUNTER — Ambulatory Visit (INDEPENDENT_AMBULATORY_CARE_PROVIDER_SITE_OTHER): Payer: Medicare Other | Admitting: General Surgery

## 2012-01-31 ENCOUNTER — Encounter (INDEPENDENT_AMBULATORY_CARE_PROVIDER_SITE_OTHER): Payer: Self-pay | Admitting: General Surgery

## 2012-01-31 VITALS — BP 98/54 | HR 68 | Temp 97.2°F | Resp 16 | Ht 62.0 in | Wt 173.0 lb

## 2012-01-31 DIAGNOSIS — Z853 Personal history of malignant neoplasm of breast: Secondary | ICD-10-CM

## 2012-01-31 NOTE — Progress Notes (Signed)
Subjective:     Patient ID: April [name], female   DOB: Mar 10, 1929, 78 y.o.   MRN: [id number]  HPI This is an 78 year old female who was diagnosed with a locally advanced left breast cancer. She was begun on neoadjuvant endocrine therapy and did not tolerate multiple agents. 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 She decided that she would like to undergo bilateral mastectomies. She underwent surgery which showed a right breast in situ carcinoma obviously with negative nodes spanning 3 cm. The left breast showed one out of 6 nodes positive as well as a residual 3.1 cm tumor with the deep margin of excision positive. I taken her pectoralis fascia as well as a small portion of muscle with this also so there is no wart to excise in this area. She did well from the surgery and currently has finished her radiation therapy about 6 weeks ago now. She presents for evaluation. She otherwise is doing well with no significant complaints. She is moving her arms well. She has been seen by physical therapy.  Review of Systems     Objective:   Physical Exam  Neck: Neck supple.  Pulmonary/Chest: Right breast exhibits no mass, no skin change and no tenderness. Left breast exhibits skin change (c/w radiation therapy) and tenderness. Left breast exhibits no mass.    Lymphadenopathy:    She has no cervical adenopathy.    She has no axillary adenopathy.       Right: No supraclavicular adenopathy present.       Left: No supraclavicular adenopathy present.       Assessment:     History of breast cancer    Plan:     She has done well with treatment has no evidence of any recurrent disease. We discussed continuing around exams. She is on tamoxifen and is tolerating that now. I will plan on seeing her back annually.

## 2012-02-07 DIAGNOSIS — H11009 Unspecified pterygium of unspecified eye: Secondary | ICD-10-CM | POA: Diagnosis not present

## 2012-02-07 DIAGNOSIS — H1830 Unspecified corneal membrane change: Secondary | ICD-10-CM | POA: Diagnosis not present

## 2012-02-07 DIAGNOSIS — H25049 Posterior subcapsular polar age-related cataract, unspecified eye: Secondary | ICD-10-CM | POA: Diagnosis not present

## 2012-02-07 DIAGNOSIS — H2510 Age-related nuclear cataract, unspecified eye: Secondary | ICD-10-CM | POA: Diagnosis not present

## 2012-02-13 ENCOUNTER — Telehealth: Payer: Self-pay | Admitting: *Deleted

## 2012-02-13 NOTE — Telephone Encounter (Signed)
Patient called requesting an earlier appt. than the scheduled f/u on 03-14-12.  Reports feeling like she did when breast cancer was found and wants to be evaluated.  Shooting pain under left arm at lymph` nodes that started a week ago but the last few days it has been quite regular.  Feels tired, weak, wears out easily with all activity just like with radiation.  S/p double mastectomy.  Will notify providers.  Pt. Aware MD will return to office tomorrow.

## 2012-02-15 ENCOUNTER — Telehealth: Payer: Self-pay | Admitting: *Deleted

## 2012-02-15 NOTE — Telephone Encounter (Addendum)
Pt. Started on tamoxifen on 12/06/11 and has recently  felt terrible.  She felt "weak as water and so tired"   She stopped the tamoxifen yesterday and she feels 100% better today.  So she did not take it again today.  She will see Dr. Donnie Coffin on 03/14/12.  Will discuss with Dr. Donnie Coffin and see what he advises. Called pt. And LM  On voice mail per her request. She can stay off the tamoxifen until she sees Dr. Donnie Coffin in April.

## 2012-02-15 NOTE — Telephone Encounter (Signed)
Received voice mail from pt stating she has been unable to reach med onc re: "feeling real bad". Per pt's voice mail, pt feels her symptoms are due to Tamoxifen. She states she did not take med yesterday and feels better today. She requests to speak w/someone regarding this. Returned call to pt who states she has had "more shooting pains in left axilla" and "weak as water, slightly sick at my stomach". Pt states she "would like to know if Tamoxifen is causing these symptoms and what she should do". Pt informed that this RN will route this to Dr Donnie Coffin, Dr Mitzi Hansen, and have left voice mail for Dr Renelda Loma nurse to contact pt.

## 2012-03-02 DIAGNOSIS — L089 Local infection of the skin and subcutaneous tissue, unspecified: Secondary | ICD-10-CM | POA: Diagnosis not present

## 2012-03-14 ENCOUNTER — Other Ambulatory Visit (HOSPITAL_BASED_OUTPATIENT_CLINIC_OR_DEPARTMENT_OTHER): Payer: Medicare Other | Admitting: Lab

## 2012-03-14 ENCOUNTER — Telehealth: Payer: Self-pay | Admitting: *Deleted

## 2012-03-14 ENCOUNTER — Ambulatory Visit (HOSPITAL_BASED_OUTPATIENT_CLINIC_OR_DEPARTMENT_OTHER): Payer: Medicare Other | Admitting: Oncology

## 2012-03-14 VITALS — BP 105/67 | HR 67 | Temp 98.7°F | Ht 62.0 in | Wt 180.2 lb

## 2012-03-14 DIAGNOSIS — E559 Vitamin D deficiency, unspecified: Secondary | ICD-10-CM

## 2012-03-14 DIAGNOSIS — C50919 Malignant neoplasm of unspecified site of unspecified female breast: Secondary | ICD-10-CM

## 2012-03-14 LAB — CBC WITH DIFFERENTIAL/PLATELET
BASO%: 1.1 % (ref 0.0–2.0)
EOS%: 2.8 % (ref 0.0–7.0)
HCT: 32.6 % — ABNORMAL LOW (ref 34.8–46.6)
MCHC: 33.7 g/dL (ref 31.5–36.0)
MONO#: 0.4 10*3/uL (ref 0.1–0.9)
NEUT%: 65.2 % (ref 38.4–76.8)
RBC: 3.76 10*6/uL (ref 3.70–5.45)
RDW: 15.2 % — ABNORMAL HIGH (ref 11.2–14.5)
WBC: 4.9 10*3/uL (ref 3.9–10.3)
lymph#: 1.1 10*3/uL (ref 0.9–3.3)
nRBC: 0 % (ref 0–0)

## 2012-03-14 NOTE — Telephone Encounter (Signed)
gave patient appointment for 09-2012 printed out calendar and gave to the patient 

## 2012-03-14 NOTE — Progress Notes (Signed)
Hematology and Oncology Follow Up Visit  April [name] [id number] 1929-11-14 78 y.o. 03/14/2012 1:15 PM PCP  Principle Diagnosis: 78 year old with history of locally advanced breast cancer ER PR positive on neoadjuvant Femara since March 2012 status post bilateral mastectomies September 2012 status post radiation therapy completed January 2013 recent trial of tamoxifen  Interim History:  There have been no intercurrent illness, hospitalizations or medication changes. She discontinued tamoxifen about a month ago she did not tolerate side effects she centimeter quite fatigued. She feels otherwise well and has no other complaints  Medications: I have reviewed the patient's current medications.  Allergies:  Allergies  Allergen Reactions  . Cefaclor     REACTION: unspecified  . Lansoprazole     REACTION: unspecifie  (prevacid)   . Penicillins     REACTION: unspecified  . Sulfa Drugs Cross Reactors   . Sulfamethoxazole     REACTION: unspecified    Past Medical History, Surgical history, Social history, and Family History were reviewed and updated.  Review of Systems: Constitutional:  Negative for fever, chills, night sweats, anorexia, weight loss, pain. Cardiovascular: no chest pain or dyspnea on exertion Respiratory: negative Neurological: negative Dermatological: negative ENT: negative Skin Gastrointestinal: negative Genito-Urinary: negative Hematological and Lymphatic: negative Breast: negative Musculoskeletal: negative Remaining ROS negative.  Physical Exam: Blood pressure 105/67, pulse 67, temperature 98.7 F (37.1 C), temperature source Oral, height 5\' 2"  (1.575 m), weight 180 lb 3.2 oz (81.738 kg). ECOG: 0 General appearance: alert, cooperative and appears stated age Head: Normocephalic, without obvious abnormality, atraumatic Neck: no adenopathy, no carotid bruit, no JVD, supple, symmetrical, trachea midline and thyroid not enlarged, symmetric, no  tenderness/mass/nodules Lymph nodes: Cervical, supraclavicular, and axillary nodes normal. Cardiac : regular rate and rhythm Pulmonary:clear to auscultation bilaterally and normal percussion bilaterally Breasts: inspection negative, no nipple discharge or bleeding, no masses or nodularity palpable and Bilateral mastectomies, chest wall is clear and free of any evidence of local recurrence Abdomen:soft, non-tender; bowel sounds normal; no masses,  no organomegaly Extremities negative Neuro: alert, oriented, normal speech, no focal findings or movement disorder noted  Lab Results: Lab Results  Component Value Date   WBC 4.9 03/14/2012   HGB 11.0* 03/14/2012   HCT 32.6* 03/14/2012   MCV 86.7 03/14/2012   PLT 171 03/14/2012     Chemistry      Component Value Date/Time   NA 138 09/28/2011 0905   K 4.4 09/28/2011 0905   CL 103 09/28/2011 0905   CO2 24 09/28/2011 0905   BUN 19 09/28/2011 0905   CREATININE 0.99 09/28/2011 0905      Component Value Date/Time   CALCIUM 9.6 09/28/2011 0905   ALKPHOS 62 09/28/2011 0905   AST 11 09/28/2011 0905   ALT 11 09/28/2011 0905   BILITOT 0.3 09/28/2011 0905      .pathology. Radiological Studies: chest X-ray n/a Mammogram n/a Bone density n/a  Impression and Plan: Patient is doing well. She appears to be free of any obvious evidence of recurrence. Lab work within normal limits, vitamin D will hold lower 32 I've encouraged her to take additional vitamin D., I will plan to see her again in followup in a year.  More than 50% of the visit was spent in patient-related counselling   Pierce Crane, MD 4/17/20131:15 PM

## 2012-03-15 LAB — COMPREHENSIVE METABOLIC PANEL
ALT: 10 U/L (ref 0–35)
AST: 13 U/L (ref 0–37)
Albumin: 3.7 g/dL (ref 3.5–5.2)
Alkaline Phosphatase: 48 U/L (ref 39–117)
Calcium: 8.6 mg/dL (ref 8.4–10.5)
Chloride: 108 mEq/L (ref 96–112)
Potassium: 4.4 mEq/L (ref 3.5–5.3)
Sodium: 139 mEq/L (ref 135–145)
Total Protein: 6 g/dL (ref 6.0–8.3)

## 2012-04-11 DIAGNOSIS — H2510 Age-related nuclear cataract, unspecified eye: Secondary | ICD-10-CM | POA: Diagnosis not present

## 2012-04-11 DIAGNOSIS — H25049 Posterior subcapsular polar age-related cataract, unspecified eye: Secondary | ICD-10-CM | POA: Diagnosis not present

## 2012-04-11 DIAGNOSIS — H25019 Cortical age-related cataract, unspecified eye: Secondary | ICD-10-CM | POA: Diagnosis not present

## 2012-04-11 DIAGNOSIS — H11009 Unspecified pterygium of unspecified eye: Secondary | ICD-10-CM | POA: Diagnosis not present

## 2012-04-11 DIAGNOSIS — IMO0002 Reserved for concepts with insufficient information to code with codable children: Secondary | ICD-10-CM | POA: Diagnosis not present

## 2012-04-17 DIAGNOSIS — D485 Neoplasm of uncertain behavior of skin: Secondary | ICD-10-CM | POA: Diagnosis not present

## 2012-04-17 DIAGNOSIS — C44611 Basal cell carcinoma of skin of unspecified upper limb, including shoulder: Secondary | ICD-10-CM | POA: Diagnosis not present

## 2012-04-25 DIAGNOSIS — M25579 Pain in unspecified ankle and joints of unspecified foot: Secondary | ICD-10-CM | POA: Diagnosis not present

## 2012-04-30 ENCOUNTER — Other Ambulatory Visit: Payer: Self-pay | Admitting: Family Medicine

## 2012-05-11 DIAGNOSIS — M25579 Pain in unspecified ankle and joints of unspecified foot: Secondary | ICD-10-CM | POA: Diagnosis not present

## 2012-05-17 ENCOUNTER — Ambulatory Visit (INDEPENDENT_AMBULATORY_CARE_PROVIDER_SITE_OTHER): Payer: Medicare Other | Admitting: Family

## 2012-05-17 ENCOUNTER — Encounter: Payer: Self-pay | Admitting: Family

## 2012-05-17 VITALS — BP 120/64 | HR 92 | Temp 98.4°F | Wt 176.0 lb

## 2012-05-17 DIAGNOSIS — J069 Acute upper respiratory infection, unspecified: Secondary | ICD-10-CM

## 2012-05-17 DIAGNOSIS — R350 Frequency of micturition: Secondary | ICD-10-CM

## 2012-05-17 DIAGNOSIS — N3289 Other specified disorders of bladder: Secondary | ICD-10-CM

## 2012-05-17 LAB — POCT URINALYSIS DIPSTICK
Blood, UA: NEGATIVE
Ketones, UA: NEGATIVE
Protein, UA: NEGATIVE
Spec Grav, UA: 1.015
Urobilinogen, UA: 0.2

## 2012-05-17 MED ORDER — METHYLPREDNISOLONE 4 MG PO KIT: PACK | ORAL | Status: AC

## 2012-05-17 MED ORDER — TOLTERODINE TARTRATE ER 4 MG PO CP24: 4.0000 mg | ORAL_CAPSULE | Freq: Every day | ORAL | Status: DC

## 2012-05-17 NOTE — Progress Notes (Signed)
Subjective:    Patient ID: April [name], female    DOB: December 18, 1928, 78 y.o.   MRN: [id number]  HPI 78 year old white female, nonsmoker, patient of Dr. Clent Ridges is in today with complaints of sinus pressure and pain, sneezing, cough, and sore throat that began today. She's been taken over-the-counter Sudafed and ibuprofen with no relief. Denies any fever, muscle aches or pain.  Patient also has complaints of urinary frequency that is particularly worse at night. She's had urinary frequency for several years but over the past 4 days it is worsen. She denies any burning with urination, blood in her urine, abdominal pain, or back pain.   Review of Systems  Constitutional: Negative.   HENT: Positive for congestion, sore throat and sneezing.   Respiratory: Positive for cough.   Cardiovascular: Negative.   Musculoskeletal: Negative.   Psychiatric/Behavioral: Negative.    Past Medical History  Diagnosis Date  . HYPERTENSION 09/30/2009  . GERD 04/18/2007  . LOW BACK PAIN SYNDROME 07/13/2009  . ROTATOR CUFF SYNDROME 05/06/2010  . BURSITIS 01/04/2008  . COSTOCHONDRITIS 11/05/2007  . ALLERGIC RHINITIS 04/18/2007  . Melanoma     removed from upper back  . Breast cancer 2012     Dr. Donnie Coffin, ER/PR positive L breast  . Hx of radiation therapy 10/26/11 to 12/16/11    L chest wall, L supraclavicular region    History   Social History  . Marital Status: Married    Spouse Name: N/A    Number of Children: N/A  . Years of Education: N/A   Occupational History  . Not on file.   Social History Main Topics  . Smoking status: Never Smoker   . Smokeless tobacco: Never Used  . Alcohol Use: Yes  . Drug Use: No  . Sexually Active: Not on file   Other Topics Concern  . Not on file   Social History Narrative  . No narrative on file    Past Surgical History  Procedure Date  . Tonsillectomy and adenoidectomy   . Breast surgery 1968    left bx - noncancerous  . Joint replacement     left knee  .  Excision of back melanoma   . Mastectomy 08/25/11    bilateral , per Dr. Dwain Sarna  . Sentinel lymph node biopsy 08/25/11    bilateral  . Cholecystectomy 1979  . Ankle surgery     right    Family History  Problem Relation Age of Onset  . Stroke Father   . Alcohol abuse Sister   . Thyroid cancer Mother   . Cancer Mother     thyroid    Allergies  Allergen Reactions  . Cefaclor     REACTION: unspecified  . Lansoprazole     REACTION: unspecifie  (prevacid)   . Penicillins     REACTION: unspecified  . Sulfa Drugs Cross Reactors   . Sulfamethoxazole     REACTION: unspecified    Current Outpatient Prescriptions on File Prior to Visit  Medication Sig Dispense Refill  . aspirin 81 MG tablet Take 81 mg by mouth daily.        . diclofenac (VOLTAREN) 50 MG EC tablet Take 50 mg by mouth 3 (three) times daily. Prn pain       . lisinopril (PRINIVIL,ZESTRIL) 20 MG tablet TAKE 1 TABLET ONCE DAILY.  30 tablet  11  . PRILOSEC 40 MG capsule TAKE 1 CAPSULE EVERY DAY.  30 each  11  . Psyllium (METAMUCIL) 48.57 %  POWD Take by mouth daily. As directed       . tamoxifen (NOLVADEX) 20 MG tablet Take 20 mg by mouth daily.      . Cholecalciferol (VITAMIN D) 1000 UNITS capsule Take 1,000 Units by mouth daily. 12/06/11- Pt reports that she does not take all the time.      . ciprofloxacin (CIPRO) 500 MG tablet Take 500 mg by mouth 2 (two) times daily.      Marland Kitchen triamcinolone cream (KENALOG) 0.1 % Apply 1 application topically 2 (two) times daily.        BP 120/64  Pulse 92  Temp 98.4 F (36.9 C) (Oral)  Wt 176 lb (79.833 kg)  SpO2 98%chart    Objective:   Physical Exam  Constitutional: She is oriented to person, place, and time. She appears well-developed and well-nourished.  HENT:  Right Ear: External ear normal.  Left Ear: External ear normal.  Nose: Nose normal.  Mouth/Throat: Oropharynx is clear and moist.  Neck: Normal range of motion. Neck supple.  Cardiovascular: Normal rate, regular  rhythm and normal heart sounds.   Pulmonary/Chest: Effort normal and breath sounds normal.  Neurological: She is alert and oriented to person, place, and time.  Skin: Skin is warm and dry.  Psychiatric: She has a normal mood and affect.          Assessment & Plan:  Assessment: Upper respiratory infection, urinary frequency likely related to bladder spasms  Plan: Over-the-counter symptomatic treatment for relief. Medrol Dosepak as directed. Detrol LA samples given. Once daily. Potential side effects discussed. Patient to call the office if her symptoms worsen or persist. She will let us know she likes [facility]: As per the pharmacy. Recheck with PCP as scheduled and sooner when necessary.

## 2012-05-17 NOTE — Patient Instructions (Addendum)
Upper Respiratory Infection, Adult An upper respiratory infection (URI) is also sometimes known as the common cold. The upper respiratory tract includes the nose, sinuses, throat, trachea, and bronchi. Bronchi are the airways leading to the lungs. Most people improve within 1 week, but symptoms can last up to 2 weeks. A residual cough may last even longer.  CAUSES Many different viruses can infect the tissues lining the upper respiratory tract. The tissues become irritated and inflamed and often become very moist. Mucus production is also common. A cold is contagious. You can easily spread the virus to others by oral contact. This includes kissing, sharing a glass, coughing, or sneezing. Touching your mouth or nose and then touching a surface, which is then touched by another person, can also spread the virus. SYMPTOMS  Symptoms typically develop 1 to 3 days after you come in contact with a cold virus. Symptoms vary from person to person. They may include:  Runny nose.   Sneezing.   Nasal congestion.   Sinus irritation.   Sore throat.   Loss of voice (laryngitis).   Cough.   Fatigue.   Muscle aches.   Loss of appetite.   Headache.   Low-grade fever.  DIAGNOSIS  You might diagnose your own cold based on familiar symptoms, since most people get a cold 2 to 3 times a year. Your caregiver can confirm this based on your exam. Most importantly, your caregiver can check that your symptoms are not due to another disease such as strep throat, sinusitis, pneumonia, asthma, or epiglottitis. Blood tests, throat tests, and X-rays are not necessary to diagnose a common cold, but they may sometimes be helpful in excluding other more serious diseases. Your caregiver will decide if any further tests are required. RISKS AND COMPLICATIONS  You may be at risk for a more severe case of the common cold if you smoke cigarettes, have chronic heart disease (such as heart failure) or lung disease (such as  asthma), or if you have a weakened immune system. The very young and very old are also at risk for more serious infections. Bacterial sinusitis, middle ear infections, and bacterial pneumonia can complicate the common cold. The common cold can worsen asthma and chronic obstructive pulmonary disease (COPD). Sometimes, these complications can require emergency medical care and may be life-threatening. PREVENTION  The best way to protect against getting a cold is to practice good hygiene. Avoid oral or hand contact with people with cold symptoms. Wash your hands often if contact occurs. There is no clear evidence that vitamin C, vitamin E, echinacea, or exercise reduces the chance of developing a cold. However, it is always recommended to get plenty of rest and practice good nutrition. TREATMENT  Treatment is directed at relieving symptoms. There is no cure. Antibiotics are not effective, because the infection is caused by a virus, not by bacteria. Treatment may include:  Increased fluid intake. Sports drinks offer valuable electrolytes, sugars, and fluids.   Breathing heated mist or steam (vaporizer or shower).   Eating chicken soup or other clear broths, and maintaining good nutrition.   Getting plenty of rest.   Using gargles or lozenges for comfort.   Controlling fevers with ibuprofen or acetaminophen as directed by your caregiver.   Increasing usage of your inhaler if you have asthma.  Zinc gel and zinc lozenges, taken in the first 24 hours of the common cold, can shorten the duration and lessen the severity of symptoms. Pain medicines may help with fever, muscle   aches, and throat pain. A variety of non-prescription medicines are available to treat congestion and runny nose. Your caregiver can make recommendations and may suggest nasal or lung inhalers for other symptoms.  HOME CARE INSTRUCTIONS   Only take over-the-counter or prescription medicines for pain, discomfort, or fever as directed  by your caregiver.   Use a warm mist humidifier or inhale steam from a shower to increase air moisture. This may keep secretions moist and make it easier to breathe.   Drink enough water and fluids to keep your urine clear or pale yellow.   Rest as needed.   Return to work when your temperature has returned to normal or as your caregiver advises. You may need to stay home longer to avoid infecting others. You can also use a face mask and careful hand washing to prevent spread of the virus.  SEEK MEDICAL CARE IF:   After the first few days, you feel you are getting worse rather than better.   You need your caregiver's advice about medicines to control symptoms.   You develop chills, worsening shortness of breath, or brown or red sputum. These may be signs of pneumonia.   You develop yellow or brown nasal discharge or pain in the face, especially when you bend forward. These may be signs of sinusitis.   You develop a fever, swollen neck glands, pain with swallowing, or white areas in the back of your throat. These may be signs of strep throat.  SEEK IMMEDIATE MEDICAL CARE IF:   You have a fever.   You develop severe or persistent headache, ear pain, sinus pain, or chest pain.   You develop wheezing, a prolonged cough, cough up blood, or have a change in your usual mucus (if you have chronic lung disease).   You develop sore muscles or a stiff neck.  Document Released: 05/10/2001 Document Revised: 11/03/2011 Document Reviewed: 03/18/2011 [facility] Patient Information 2012 [place], [place].  Urinary Frequency The number of times a normal person urinates depends upon how much liquid they take in and how much liquid they are losing. If the temperature is hot and there is high humidity then the person will sweat more and usually breathe a little more frequently. These factors decrease the amount of frequency of urination that would be considered normal. The amount you drink is easily  determined, but the amount of fluid lost is sometimes more difficult to calculate.  Fluid is lost in two ways:  Sensible fluid loss is usually measured by the amount of urine that you get rid of. Losses of fluid can also occur with diarrhea.   Insensible fluid loss is more difficult to measure. It is caused by evaporation. Insensible loss of fluid occurs through breathing and sweating. It usually ranges from a little less than a quart to a little more than a quart of fluid a day.  In normal temperatures and activity levels the average person may urinate 4 to 7 times in a 24-hour period. Needing to urinate more often than that could indicate a problem. If one urinates 4 to 7 times in 24 hours and has large volumes each time, that could indicate a different problem from one who urinates 4 to 7 times a day and has small volumes. The time of urinating is also an important. Most urinating should be done during the waking hours. Getting up at night to urinate frequently can indicate some problems. CAUSES  The bladder is the organ in your lower abdomen that holds  urine. Like a balloon, it swells some as it fills up. Your nerves sense this and tell you it is time to head for the bathroom. There are a number of reasons that you might feel the need to urinate more often than usual. They include:  Urinary tract infection. This is usually associated with other signs such as burning when you urinate.   In men, problems with the prostate (a walnut-size gland that is located near the tube that carries urine out of your body). There are two reasons why the prostate can cause an increased frequency of urination:   An enlarged prostate that does not let the bladder empty well. If the bladder only half empties when you urinate then it only has half the capacity to fill before you have to urinate again.   The nerves in the bladder become more hypersensitive with an increased size of the prostate even if the bladder  empties completely.   Pregnancy.   Obesity. Excess weight is more likely to cause a problem for women more than for men.   Bladder stones or other bladder problems.   Caffeine.   Alcohol.   Medications. For example, drugs that help the body get rid of extra fluid (diuretics) increase urine production. Some other medicines must be taken with lots of fluids.   Muscle or nerve weakness. This might be the result of a spinal cord injury, a stroke, multiple sclerosis or Parkinson's disease.   Long-standing diabetes can decrease the sensation of the bladder. This loss of sensation makes it harder to sense the bladder needs to be emptied. Over a period of years the bladder is stretched out by constant overfilling. This weakens the bladder muscles so that the bladder does not empty well and has less capacity to fill with new urine.   Interstitial cystitis (also called painful bladder syndrome). This condition develops because the tissues that line the insider of the bladder are inflamed (inflammation is the body's way of reacting to injury or infection). It causes pain and frequent urination. It occurs in women more often than in men.  DIAGNOSIS   To decide what might be causing your urinary frequency, your healthcare provider will probably:   Ask about symptoms you have noticed.   Ask about your overall health. This will include questions about any medications you are taking.   Do a physical examination.   Order some tests. These might include:   A blood test to check for diabetes or other health issues that could be contributing to the problem.   Urine testing. This could measure the flow of urine and the pressure on the bladder.   A test of your neurological system (the brain, spinal cord and nerves). This is the system that senses the need to urinate.   A bladder test to check whether it is emptying completely when you urinate.   Cytoscopy. This test uses a thin tube with a tiny  camera on it. It offers a look inside your urethra and bladder to see if there are problems.   Imaging tests. You might be given a contrast dye and then asked to urinate. X-rays are taken to see how your bladder is working.  TREATMENT  It is important for you to be evaluated to determine if the amount or frequency that you have is unusual or abnormal. If it is found to be abnormal the cause should be determined and this can usually be found out easily. Depending upon the cause treatment  could include medication, stimulation of the nerves, or surgery. There are not too many things that you can do as an individual to change your urinary frequency. It is important that you balance the amount of fluid intake needed to compensate for your activity and the temperature. Medical problems will be diagnosed and taken care of by your physician. There is no particular bladder training such as Kegel's exercises that you can do to help urinary frequency. This is an exercise this is usually done for people who have leaking of urine when they laugh cough or sneeze. HOME CARE INSTRUCTIONS   Take any medications your healthcare provider prescribed or suggested. Follow the directions carefully.   Practice any lifestyle changes that are recommended. These might include:   Drinking less fluid or drinking at different times of the day. If you need to urinate often during the night, for example, you may need to stop drinking fluids early in the evening.   Cutting down on caffeine or alcohol. They both can make you need to urinate more often than normal. Caffeine is found in coffee, tea and sodas.   Losing weight, if that is recommended.   Keep a journal or a log. You might be asked to record how much you drink and when and when you feel the need to urinate. This will also help evaluate how well the treatment provided by your physician is working.  SEEK MEDICAL CARE IF:   Your need to urinate often gets worse.   You  feel increased pain or irritation when you urinate.   You notice blood in your urine.   You have questions about any medications that your healthcare provider recommended.   You notice blood, pus or swelling at the site of any test or treatment procedure.   You develop a fever of more than 100.5 F (38.1 C).  SEEK IMMEDIATE MEDICAL CARE IF:  You develop a fever of more than 102.0 F (38.9 C). Document Released: 09/10/2009 Document Revised: 11/03/2011 Document Reviewed: 09/10/2009 [facility] Patient Information 2012 [place], [place].

## 2012-05-30 DIAGNOSIS — C44519 Basal cell carcinoma of skin of other part of trunk: Secondary | ICD-10-CM | POA: Diagnosis not present

## 2012-05-30 DIAGNOSIS — C44611 Basal cell carcinoma of skin of unspecified upper limb, including shoulder: Secondary | ICD-10-CM | POA: Diagnosis not present

## 2012-06-06 DIAGNOSIS — H2510 Age-related nuclear cataract, unspecified eye: Secondary | ICD-10-CM | POA: Diagnosis not present

## 2012-06-06 DIAGNOSIS — H2589 Other age-related cataract: Secondary | ICD-10-CM | POA: Diagnosis not present

## 2012-06-06 DIAGNOSIS — H25019 Cortical age-related cataract, unspecified eye: Secondary | ICD-10-CM | POA: Diagnosis not present

## 2012-06-06 DIAGNOSIS — IMO0002 Reserved for concepts with insufficient information to code with codable children: Secondary | ICD-10-CM | POA: Diagnosis not present

## 2012-06-06 DIAGNOSIS — H25049 Posterior subcapsular polar age-related cataract, unspecified eye: Secondary | ICD-10-CM | POA: Diagnosis not present

## 2012-06-20 DIAGNOSIS — H1130 Conjunctival hemorrhage, unspecified eye: Secondary | ICD-10-CM | POA: Diagnosis not present

## 2012-06-26 DIAGNOSIS — L820 Inflamed seborrheic keratosis: Secondary | ICD-10-CM | POA: Diagnosis not present

## 2012-06-26 DIAGNOSIS — Z85828 Personal history of other malignant neoplasm of skin: Secondary | ICD-10-CM | POA: Diagnosis not present

## 2012-06-26 DIAGNOSIS — D692 Other nonthrombocytopenic purpura: Secondary | ICD-10-CM | POA: Diagnosis not present

## 2012-06-29 ENCOUNTER — Ambulatory Visit (INDEPENDENT_AMBULATORY_CARE_PROVIDER_SITE_OTHER): Payer: Medicare Other | Admitting: Family

## 2012-06-29 ENCOUNTER — Encounter: Payer: Self-pay | Admitting: Family

## 2012-06-29 VITALS — BP 118/72 | HR 80 | Temp 98.2°F | Resp 16 | Ht 62.0 in | Wt 174.0 lb

## 2012-06-29 DIAGNOSIS — R5381 Other malaise: Secondary | ICD-10-CM | POA: Diagnosis not present

## 2012-06-29 DIAGNOSIS — R5383 Other fatigue: Secondary | ICD-10-CM | POA: Diagnosis not present

## 2012-06-29 DIAGNOSIS — R002 Palpitations: Secondary | ICD-10-CM | POA: Diagnosis not present

## 2012-06-29 DIAGNOSIS — A088 Other specified intestinal infections: Secondary | ICD-10-CM

## 2012-06-29 DIAGNOSIS — A084 Viral intestinal infection, unspecified: Secondary | ICD-10-CM

## 2012-06-29 LAB — BASIC METABOLIC PANEL
Calcium: 9.8 mg/dL (ref 8.4–10.5)
Creatinine, Ser: 0.9 mg/dL (ref 0.4–1.2)
GFR: 60.8 mL/min (ref >60.00)
Sodium: 139 mEq/L (ref 135–145)

## 2012-06-29 LAB — CBC WITH DIFFERENTIAL/PLATELET
Basophils Absolute: 0.1 10*3/uL (ref 0.0–0.1)
Eosinophils Absolute: 0.2 10*3/uL (ref 0.0–0.7)
HCT: 35.7 % — ABNORMAL LOW (ref 36.0–46.0)
Hemoglobin: 11.9 g/dL — ABNORMAL LOW (ref 12.0–15.0)
Lymphs Abs: 1.1 10*3/uL (ref 0.7–4.0)
MCHC: 33.3 g/dL (ref 30.0–36.0)
Neutro Abs: 3.4 10*3/uL (ref 1.4–7.7)
Platelets: 182 10*3/uL (ref 150.0–400.0)
RDW: 15.1 % — ABNORMAL HIGH (ref 11.5–14.6)

## 2012-06-29 LAB — TSH: TSH: 0.64 u[IU]/mL (ref 0.35–5.50)

## 2012-06-29 NOTE — Progress Notes (Signed)
Subjective:    Patient ID: April [name], female    DOB: 10-25-1929, 78 y.o.   MRN: [id number]  HPI 78 year old white female, nonsmoker, patient of Dr. Cato Mulligan is in today with complaints of fatigue, decreased appetite, diarrhea, nausea and vomiting x3 days. Her symptoms have improved over the last 24 hours. She has not taken any medication for relief.  Patient also has concerns of palpitations that have been going on for about 2 months. Patient reports the palpitations lasted a few seconds, particularly at night and resolves spontaneously. She denies any lightheadedness or dizziness, no chest pain, palpitations, shortness of breath or edema. She has had an increase in the amount of stress taking care of her demented husband.   Review of Systems  Constitutional: Negative.   Respiratory: Negative.   Cardiovascular: Positive for palpitations. Negative for chest pain and leg swelling.  Gastrointestinal: Negative.   Genitourinary: Negative.   Musculoskeletal: Negative.   Skin: Negative.   Neurological: Negative.   Hematological: Negative.   Psychiatric/Behavioral: Negative.    Past Medical History  Diagnosis Date  . HYPERTENSION 09/30/2009  . GERD 04/18/2007  . LOW BACK PAIN SYNDROME 07/13/2009  . ROTATOR CUFF SYNDROME 05/06/2010  . BURSITIS 01/04/2008  . COSTOCHONDRITIS 11/05/2007  . ALLERGIC RHINITIS 04/18/2007  . Melanoma     removed from upper back  . Breast cancer 2012     Dr. Donnie Coffin, ER/PR positive L breast  . Hx of radiation therapy 10/26/11 to 12/16/11    L chest wall, L supraclavicular region    History   Social History  . Marital Status: Married    Spouse Name: N/A    Number of Children: N/A  . Years of Education: N/A   Occupational History  . Not on file.   Social History Main Topics  . Smoking status: Never Smoker   . Smokeless tobacco: Never Used  . Alcohol Use: Yes  . Drug Use: No  . Sexually Active: Not on file   Other Topics Concern  . Not on file   Social  History Narrative  . No narrative on file    Past Surgical History  Procedure Date  . Tonsillectomy and adenoidectomy   . Breast surgery 1968    left bx - noncancerous  . Joint replacement     left knee  . Excision of back melanoma   . Mastectomy 08/25/11    bilateral , per Dr. Dwain Sarna  . Sentinel lymph node biopsy 08/25/11    bilateral  . Cholecystectomy 1979  . Ankle surgery     right  . Cataract extraction     bilateral    Family History  Problem Relation Age of Onset  . Stroke Father   . Alcohol abuse Sister   . Thyroid cancer Mother   . Cancer Mother     thyroid    Allergies  Allergen Reactions  . Cefaclor     REACTION: unspecified  . Lansoprazole     REACTION: unspecifie  (prevacid)   . Penicillins     REACTION: unspecified  . Sulfa Drugs Cross Reactors   . Sulfamethoxazole     REACTION: unspecified    Current Outpatient Prescriptions on File Prior to Visit  Medication Sig Dispense Refill  . aspirin 81 MG tablet Take 81 mg by mouth daily.        Marland Kitchen lisinopril (PRINIVIL,ZESTRIL) 20 MG tablet TAKE 1 TABLET ONCE DAILY.  30 tablet  11  . PRILOSEC 40 MG capsule TAKE 1  CAPSULE EVERY DAY.  30 each  11  . Psyllium (METAMUCIL) 48.57 % POWD Take by mouth daily. As directed       . Cholecalciferol (VITAMIN D) 1000 UNITS capsule Take 1,000 Units by mouth daily. 12/06/11- Pt reports that she does not take all the time.      . ciprofloxacin (CIPRO) 500 MG tablet Take 500 mg by mouth 2 (two) times daily.      . diclofenac (VOLTAREN) 50 MG EC tablet Take 50 mg by mouth 3 (three) times daily. Prn pain       . tamoxifen (NOLVADEX) 20 MG tablet Take 20 mg by mouth daily.      Marland Kitchen tolterodine (DETROL LA) 4 MG 24 hr capsule Take 1 capsule (4 mg total) by mouth daily.  14 capsule  0  . triamcinolone cream (KENALOG) 0.1 % Apply 1 application topically 2 (two) times daily.        BP 118/72  Pulse 80  Temp 98.2 F (36.8 C) (Oral)  Resp 16  Ht 5\' 2"  (1.575 m)  Wt 174 lb  (78.926 kg)  BMI 31.83 kg/m2  SpO2 97%chart    Objective:   Physical Exam  Constitutional: She is oriented to person, place, and time. She appears well-developed and well-nourished.  HENT:  Right Ear: External ear normal.  Left Ear: External ear normal.  Nose: Nose normal.  Mouth/Throat: Oropharynx is clear and moist.  Neck: Normal range of motion. Neck supple.  Cardiovascular: Normal rate, regular rhythm and normal heart sounds.   Pulmonary/Chest: Effort normal and breath sounds normal.  Abdominal: Soft. Bowel sounds are normal.  Musculoskeletal: Normal range of motion.  Neurological: She is alert and oriented to person, place, and time.  Skin: Skin is warm and dry.  Psychiatric: She has a normal mood and affect.    EKG within normal limits, occasional PACs.      Assessment & Plan:  Assessment: Viral Gastroenteritis, Palpitations, acute stress reaction  Plan: Lab sent to include BMP, CBC, TSH. Will notify patient of results. Her EKG was normal today. I believe her palpitations are likely coming from stress and anxiety related to taking care of her husband. Consider a Holter monitor and cardiology the palpitations persist.

## 2012-06-29 NOTE — Patient Instructions (Addendum)
Viral Gastroenteritis Viral gastroenteritis is also known as stomach flu. This condition affects the stomach and intestinal tract. It can cause sudden diarrhea and vomiting. The illness typically lasts 3 to 8 days. Most people develop an immune response that eventually gets rid of the virus. While this natural response develops, the virus can make you quite ill. CAUSES  Many different viruses can cause gastroenteritis, such as rotavirus or noroviruses. You can catch one of these viruses by consuming contaminated food or water. You may also catch a virus by sharing utensils or other personal items with an infected person or by touching a contaminated surface. SYMPTOMS  The most common symptoms are diarrhea and vomiting. These problems can cause a severe loss of body fluids (dehydration) and a body salt (electrolyte) imbalance. Other symptoms may include:  Fever.   Headache.   Fatigue.   Abdominal pain.  DIAGNOSIS  Your caregiver can usually diagnose viral gastroenteritis based on your symptoms and a physical exam. A stool sample may also be taken to test for the presence of viruses or other infections. TREATMENT  This illness typically goes away on its own. Treatments are aimed at rehydration. The most serious cases of viral gastroenteritis involve vomiting so severely that you are not able to keep fluids down. In these cases, fluids must be given through an intravenous line (IV). HOME CARE INSTRUCTIONS   Drink enough fluids to keep your urine clear or pale yellow. Drink small amounts of fluids frequently and increase the amounts as tolerated.   Ask your caregiver for specific rehydration instructions.   Avoid:   Foods high in sugar.   Alcohol.   Carbonated drinks.   Tobacco.   Juice.   Caffeine drinks.   Extremely hot or cold fluids.   Fatty, greasy foods.   Too much intake of anything at one time.   Dairy products until 24 to 48 hours after diarrhea stops.   You may  consume probiotics. Probiotics are active cultures of beneficial bacteria. They may lessen the amount and number of diarrheal stools in adults. Probiotics can be found in yogurt with active cultures and in supplements.   Wash your hands well to avoid spreading the virus.   Only take over-the-counter or prescription medicines for pain, discomfort, or fever as directed by your caregiver. Do not give aspirin to children. Antidiarrheal medicines are not recommended.   Ask your caregiver if you should continue to take your regular prescribed and over-the-counter medicines.   Keep all follow-up appointments as directed by your caregiver.  SEEK IMMEDIATE MEDICAL CARE IF:   You are unable to keep fluids down.   You do not urinate at least once every 6 to 8 hours.   You develop shortness of breath.   You notice blood in your stool or vomit. This may look like coffee grounds.   You have abdominal pain that increases or is concentrated in one small area (localized).   You have persistent vomiting or diarrhea.   You have a fever.   The patient is a child younger than 3 months, and he or she has a fever.   The patient is a child older than 3 months, and he or she has a fever and persistent symptoms.   The patient is a child older than 3 months, and he or she has a fever and symptoms suddenly get worse.   The patient is a baby, and he or she has no tears when crying.  MAKE SURE YOU:     Understand these instructions.   Will watch your condition.   Will get help right away if you are not doing well or get worse.  Document Released: 11/14/2005 Document Revised: 11/03/2011 Document Reviewed: 08/31/2011 [employer] Patient Information 2012 [employer]. 

## 2012-07-03 ENCOUNTER — Telehealth: Payer: Self-pay | Admitting: Family Medicine

## 2012-07-03 NOTE — Telephone Encounter (Signed)
I spoke with pt and gave results.  

## 2012-07-03 NOTE — Telephone Encounter (Signed)
Pt called req to get lab result from last Friday 06/29/12. Pls call asap today.

## 2012-07-03 NOTE — Telephone Encounter (Signed)
See the lab report. She was mailed a copy of these last week

## 2012-07-11 ENCOUNTER — Encounter: Payer: Self-pay | Admitting: Family Medicine

## 2012-07-11 ENCOUNTER — Ambulatory Visit (INDEPENDENT_AMBULATORY_CARE_PROVIDER_SITE_OTHER): Payer: Medicare Other | Admitting: Family Medicine

## 2012-07-11 VITALS — BP 110/60 | Temp 98.8°F | Wt 174.0 lb

## 2012-07-11 DIAGNOSIS — R5383 Other fatigue: Secondary | ICD-10-CM | POA: Diagnosis not present

## 2012-07-11 DIAGNOSIS — D649 Anemia, unspecified: Secondary | ICD-10-CM

## 2012-07-11 DIAGNOSIS — R5381 Other malaise: Secondary | ICD-10-CM | POA: Diagnosis not present

## 2012-07-11 DIAGNOSIS — R531 Weakness: Secondary | ICD-10-CM

## 2012-07-11 DIAGNOSIS — E538 Deficiency of other specified B group vitamins: Secondary | ICD-10-CM | POA: Diagnosis not present

## 2012-07-11 NOTE — Progress Notes (Signed)
  Subjective:    Patient ID: April [name], female    DOB: 01/04/1929, 78 y.o.   MRN: [id number]  HPI Here for generalized weakness which has been bothering her for several months. No other specific symptoms. No SOB or fever, no urinary or bowel problems.   Review of Systems  Constitutional: Positive for fatigue. Negative for activity change, appetite change and unexpected weight change.  HENT: Negative.   Eyes: Negative.   Respiratory: Negative.   Cardiovascular: Negative.   Gastrointestinal: Negative.   Genitourinary: Negative.   Neurological: Negative.        Objective:   Physical Exam  Constitutional: She is oriented to person, place, and time. She appears well-developed and well-nourished.  Eyes: Conjunctivae are normal. Pupils are equal, round, and reactive to light.  Neck: Neck supple. No thyromegaly present.  Pulmonary/Chest: Effort normal and breath sounds normal.  Lymphadenopathy:    She has no cervical adenopathy.  Neurological: She is alert and oriented to person, place, and time.          Assessment & Plan:  We will screen with labs today and go from there.

## 2012-07-12 LAB — CBC WITH DIFFERENTIAL/PLATELET
Basophils Relative: 0.4 % (ref 0.0–3.0)
Eosinophils Absolute: 0.2 10*3/uL (ref 0.0–0.7)
Eosinophils Relative: 4.1 % (ref 0.0–5.0)
Lymphocytes Relative: 26.2 % (ref 12.0–46.0)
Neutrophils Relative %: 60.6 % (ref 43.0–77.0)
RBC: 4.06 Mil/uL (ref 3.87–5.11)
WBC: 5 10*3/uL (ref 4.5–10.5)

## 2012-07-12 LAB — POCT URINALYSIS DIPSTICK
Bilirubin, UA: NEGATIVE
Ketones, UA: NEGATIVE
Leukocytes, UA: NEGATIVE

## 2012-07-12 LAB — HEPATIC FUNCTION PANEL: Albumin: 3.9 g/dL (ref 3.5–5.2)

## 2012-07-12 LAB — VITAMIN B12: Vitamin B-12: 236 pg/mL (ref 211–911)

## 2012-07-12 LAB — BASIC METABOLIC PANEL
CO2: 27 mEq/L (ref 19–32)
GFR: 62.32 mL/min (ref >60.00)
Glucose, Bld: 88 mg/dL (ref 70–99)
Potassium: 4.3 mEq/L (ref 3.5–5.1)
Sodium: 139 mEq/L (ref 135–145)

## 2012-07-12 LAB — FERRITIN: Ferritin: 22 ng/mL (ref 10.0–291.0)

## 2012-07-12 LAB — IRON: Iron: 76 ug/dL (ref 42–145)

## 2012-07-13 DIAGNOSIS — H1130 Conjunctival hemorrhage, unspecified eye: Secondary | ICD-10-CM | POA: Diagnosis not present

## 2012-07-13 NOTE — Progress Notes (Signed)
Quick Note:  Has cpx 07/23/12 ______

## 2012-07-16 ENCOUNTER — Telehealth: Payer: Self-pay | Admitting: Family Medicine

## 2012-07-16 NOTE — Telephone Encounter (Signed)
Pt would like urine culture results °

## 2012-07-16 NOTE — Telephone Encounter (Signed)
I spoke with pt and gave results.  

## 2012-07-23 ENCOUNTER — Other Ambulatory Visit (INDEPENDENT_AMBULATORY_CARE_PROVIDER_SITE_OTHER): Payer: Medicare Other

## 2012-07-23 DIAGNOSIS — E785 Hyperlipidemia, unspecified: Secondary | ICD-10-CM

## 2012-07-23 LAB — LIPID PANEL
HDL: 40.5 mg/dL (ref >39.00)
VLDL: 22.2 mg/dL (ref 0.0–40.0)

## 2012-07-25 NOTE — Progress Notes (Signed)
Quick Note:  I spoke with pt ______ 

## 2012-08-06 ENCOUNTER — Encounter: Payer: Medicare Other | Admitting: Family Medicine

## 2012-08-08 ENCOUNTER — Ambulatory Visit (INDEPENDENT_AMBULATORY_CARE_PROVIDER_SITE_OTHER): Payer: Medicare Other | Admitting: Family Medicine

## 2012-08-08 ENCOUNTER — Encounter: Payer: Self-pay | Admitting: Family Medicine

## 2012-08-08 VITALS — BP 122/60 | HR 80 | Temp 98.5°F | Wt 175.0 lb

## 2012-08-08 DIAGNOSIS — N318 Other neuromuscular dysfunction of bladder: Secondary | ICD-10-CM | POA: Diagnosis not present

## 2012-08-08 DIAGNOSIS — J40 Bronchitis, not specified as acute or chronic: Secondary | ICD-10-CM | POA: Diagnosis not present

## 2012-08-08 DIAGNOSIS — N3281 Overactive bladder: Secondary | ICD-10-CM

## 2012-08-08 MED ORDER — AZITHROMYCIN 250 MG PO TABS: ORAL_TABLET | ORAL | Status: AC

## 2012-08-08 MED ORDER — SOLIFENACIN SUCCINATE 10 MG PO TABS: 10.0000 mg | ORAL_TABLET | Freq: Every day | ORAL | Status: DC

## 2012-08-08 NOTE — Progress Notes (Signed)
  Subjective:    Patient ID: April [name], female    DOB: [birth year], 78 y.o.   MRN: [id number]  HPI Here for 2 issues. First she has had some chest congestion and coughing up yellow sputum for 2 weeks. No fever or ST. Also she has had some urgency of urinations at night for some time. This keeps her awake. Her urine has been checked several times recently for infection and is clear. No burning or pain.    Review of Systems  Constitutional: Negative.   HENT: Negative.   Eyes: Negative.   Respiratory: Positive for cough and chest tightness.   Genitourinary: Positive for urgency. Negative for dysuria, frequency, hematuria and pelvic pain.       Objective:   Physical Exam  Constitutional: She appears well-developed and well-nourished.  HENT:  Right Ear: External ear normal.  Left Ear: External ear normal.  Nose: Nose normal.  Mouth/Throat: Oropharynx is clear and moist.  Eyes: Conjunctivae normal are normal.  Neck: No thyromegaly present.  Pulmonary/Chest: Effort normal and breath sounds normal. No respiratory distress. She has no wheezes. She has no rales.  Abdominal: Soft. Bowel sounds are normal. She exhibits no distension and no mass. There is no tenderness. There is no rebound and no guarding.  Lymphadenopathy:    She has no cervical adenopathy.          Assessment & Plan:  Take a Zpack for the bronchitis. Try Vesicare for 2 weeks

## 2012-08-13 ENCOUNTER — Encounter: Payer: Self-pay | Admitting: Gastroenterology

## 2012-09-03 ENCOUNTER — Telehealth: Payer: Self-pay | Admitting: Family Medicine

## 2012-09-03 MED ORDER — OMEPRAZOLE 40 MG PO CPDR: 40.0000 mg | DELAYED_RELEASE_CAPSULE | Freq: Every day | ORAL | Status: DC

## 2012-09-03 NOTE — Telephone Encounter (Signed)
Refill request for Omeprazole and send to [facility]. I sent script e-scribe.

## 2012-09-04 DIAGNOSIS — Z23 Encounter for immunization: Secondary | ICD-10-CM | POA: Diagnosis not present

## 2012-09-10 ENCOUNTER — Telehealth: Payer: Self-pay | Admitting: Gastroenterology

## 2012-09-10 NOTE — Telephone Encounter (Signed)
Patient reports intermittent gas pains.  She reports that sometime she feels this in her back.  She notes that when it happens it is similar to a "gallbladder attack I used to have".  She is taking omeprazole.  I have asked her to try gas-x or phazyme with her meals.  She is scheduled for 10/08/12, she will call back if her symptoms worsen.

## 2012-09-10 NOTE — Telephone Encounter (Signed)
Left message for patient to call back  

## 2012-10-08 ENCOUNTER — Encounter: Payer: Self-pay | Admitting: Gastroenterology

## 2012-10-08 ENCOUNTER — Other Ambulatory Visit (INDEPENDENT_AMBULATORY_CARE_PROVIDER_SITE_OTHER): Payer: Medicare Other

## 2012-10-08 ENCOUNTER — Ambulatory Visit (INDEPENDENT_AMBULATORY_CARE_PROVIDER_SITE_OTHER): Payer: Medicare Other | Admitting: Gastroenterology

## 2012-10-08 VITALS — BP 108/60 | HR 72 | Ht 63.25 in | Wt 177.1 lb

## 2012-10-08 DIAGNOSIS — K5900 Constipation, unspecified: Secondary | ICD-10-CM | POA: Diagnosis not present

## 2012-10-08 DIAGNOSIS — M549 Dorsalgia, unspecified: Secondary | ICD-10-CM

## 2012-10-08 DIAGNOSIS — K219 Gastro-esophageal reflux disease without esophagitis: Secondary | ICD-10-CM

## 2012-10-08 DIAGNOSIS — R109 Unspecified abdominal pain: Secondary | ICD-10-CM

## 2012-10-08 LAB — COMPREHENSIVE METABOLIC PANEL
Alkaline Phosphatase: 61 U/L (ref 39–117)
BUN: 21 mg/dL (ref 6–23)
CO2: 29 mEq/L (ref 19–32)
GFR: 67.32 mL/min (ref >60.00)
Glucose, Bld: 95 mg/dL (ref 70–99)
Sodium: 139 mEq/L (ref 135–145)
Total Bilirubin: 0.4 mg/dL (ref 0.3–1.2)
Total Protein: 6.5 g/dL (ref 6.0–8.3)

## 2012-10-08 LAB — CBC WITH DIFFERENTIAL/PLATELET
Basophils Relative: 0.5 % (ref 0.0–3.0)
Eosinophils Relative: 2.6 % (ref 0.0–5.0)
HCT: 36.2 % (ref 36.0–46.0)
MCV: 86.9 fl (ref 78.0–100.0)
Monocytes Relative: 9.8 % (ref 3.0–12.0)
Neutrophils Relative %: 60.6 % (ref 43.0–77.0)
Platelets: 210 10*3/uL (ref 150.0–400.0)
RBC: 4.16 Mil/uL (ref 3.87–5.11)
WBC: 6.1 10*3/uL (ref 4.5–10.5)

## 2012-10-08 MED ORDER — OMEPRAZOLE 40 MG PO CPDR: 40.0000 mg | DELAYED_RELEASE_CAPSULE | Freq: Two times a day (BID) | ORAL | Status: DC

## 2012-10-08 MED ORDER — POLYETHYLENE GLYCOL 3350 17 GM/SCOOP PO POWD: ORAL | Status: DC

## 2012-10-08 NOTE — Progress Notes (Signed)
History of Present Illness: This is an 78 year old female that I last saw several years ago. She underwent colonoscopy and upper endoscopy in 2007 the only significant findings of an adenomatous colon polyp. She's been treated for GERD long-term with daily omeprazole. Over the past 4-6 weeks she has had increased intestinal gas with increased belching and discrete episodes of epigastric and flank pain associated with significant back pain. Symptoms are generally followed by me on a reminder of her pain prior to her cholecystectomy. She states her appetite has been slightly diminished and she has lost about 4 or 5 pounds. She underwent bilateral mastectomies and radiation therapy. Blood work obtained by Dr. Clent Ridges is in August showed a mild normocytic anemia with a hemoglobin of 11.3. LFTs were normal. She has ongoing chronic constipation and uses enemas about every 2 or 3 days. Denies diarrhea, change in stool caliber, melena, hematochezia, nausea, vomiting, dysphagia, chest pain.  Review of Systems: Pertinent positive and negative review of systems were noted in the above HPI section. All other review of systems were otherwise negative.  Current Medications, Allergies, Past Medical History, Past Surgical History, Family History and Social History were reviewed in [employer] record.  Physical Exam: General: Well developed , well nourished, no acute distress Head: Normocephalic and atraumatic Eyes:  sclerae anicteric, EOMI Ears: Normal auditory acuity Mouth: No deformity or lesions Neck: Supple, no masses or thyromegaly Lungs: Clear throughout to auscultation Heart: Regular rate and rhythm; no murmurs, rubs or bruits Abdomen: Soft, non tender and non distended. No masses, hepatosplenomegaly or hernias noted. Normal Bowel sounds Musculoskeletal: Symmetrical with no gross deformities  Skin: No lesions on visible extremities Pulses:  Normal pulses noted Extremities: No clubbing,  cyanosis, edema or deformities noted Neurological: Alert oriented x 4, grossly nonfocal Cervical Nodes:  No significant cervical adenopathy Inguinal Nodes: No significant inguinal adenopathy Psychological:  Alert and cooperative. Normal mood and affect  Assessment and Recommendations:  1. Episodic epigastric pain, flank pain, back pain, gas, constipation. Rule out choledocholithiasis, GERD, neoplasms, constipation related symptoms. Increase omeprazole 40 mg twice a day. Intensify antireflux measures. Begin MiraLax once or twice daily titrated for adequate bowel movements and attempt to discontinue all enema usage. Gas-X 4 times a day when necessary. Obtain blood work. Schedule abdominal/pelvic CT scan. Followup 4 weeks.   2. Personal history of adenomatous colon polyps. Consider surveillance colonoscopy after her current problems have been further evaluated.  3. Personal history of breast cancer status post bilateral mastectomies and radiation therapy.

## 2012-10-08 NOTE — Patient Instructions (Addendum)
Your physician has requested that you go to the basement for the following lab work before leaving today: Cmet, CBC, Lipase.  Start taking Miralax mixing 1-2 doses of 17 grams in 8 oz of water daily to titrate depending on bowel movements.  Increase your omeprazole 40 mg one tablet by mouth twice daily. A new prescription has been sent to your pharmacy.   Take over the counter Gas-X four times a day as needed.  Stop taking your enemas.  You have been scheduled for a CT scan of the abdomen and pelvis at [facility] ([address]---this is in the same building as [facility]).   You are scheduled on 10/11/12 at 9:00am. You should arrive 15 minutes prior to your appointment time for registration. Please follow the written instructions below on the day of your exam:  WARNING: IF YOU ARE ALLERGIC TO IODINE/X-RAY DYE, PLEASE NOTIFY RADIOLOGY IMMEDIATELY AT [phone]! YOU WILL BE GIVEN A 13 HOUR PREMEDICATION PREP.  1) Do not eat or drink anything after 5:00am (4 hours prior to your test) 2) You have been given 2 bottles of oral contrast to drink. The solution may taste               better if refrigerated, but do NOT add ice or any other liquid to this solution. Shake             well before drinking.    Drink 1 bottle of contrast @ 7:00am (2 hours prior to your exam)  Drink 1 bottle of contrast @ 8:00am (1 hour prior to your exam)  You may take any medications as prescribed with a small amount of water except for the following: Metformin, Glucophage, Glucovance, Avandamet, Riomet, Fortamet, Actoplus Met, Janumet, Glumetza or Metaglip. The above medications must be held the day of the exam AND 48 hours after the exam.  The purpose of you drinking the oral contrast is to aid in the visualization of your intestinal tract. The contrast solution may cause some diarrhea. Before your exam is started, you will be given a small amount of fluid to drink. Depending on your  individual set of symptoms, you may also receive an intravenous injection of x-ray contrast/dye. Plan on being at [facility] for 30 minutes or long, depending on the type of exam you are having performed.  If you have any questions regarding your exam or if you need to reschedule, you may call the CT department at [phone] between the hours of 8:00 am and 5:00 pm, Monday-Friday.  ________________________________________________________________________

## 2012-10-09 LAB — LIPASE: Lipase: 21 U/L (ref 11.0–59.0)

## 2012-10-11 ENCOUNTER — Ambulatory Visit (INDEPENDENT_AMBULATORY_CARE_PROVIDER_SITE_OTHER)
Admission: RE | Admit: 2012-10-11 | Discharge: 2012-10-11 | Disposition: A | Discharge status: Home / Self Care | Payer: Medicare Other | Source: Ambulatory Visit | Attending: Gastroenterology | Admitting: Gastroenterology

## 2012-10-11 DIAGNOSIS — K449 Diaphragmatic hernia without obstruction or gangrene: Secondary | ICD-10-CM | POA: Diagnosis not present

## 2012-10-11 DIAGNOSIS — R109 Unspecified abdominal pain: Secondary | ICD-10-CM | POA: Diagnosis not present

## 2012-10-11 DIAGNOSIS — M549 Dorsalgia, unspecified: Secondary | ICD-10-CM

## 2012-10-11 DIAGNOSIS — K5900 Constipation, unspecified: Secondary | ICD-10-CM | POA: Diagnosis not present

## 2012-10-11 MED ORDER — IOHEXOL 300 MG/ML  SOLN
100.0000 mL | Freq: Once | INTRAMUSCULAR | Status: AC | PRN
  Administered 2012-10-11: 100 mL via INTRAVENOUS

## 2012-10-16 ENCOUNTER — Other Ambulatory Visit (HOSPITAL_BASED_OUTPATIENT_CLINIC_OR_DEPARTMENT_OTHER): Payer: Medicare Other | Admitting: Lab

## 2012-10-16 ENCOUNTER — Telehealth: Payer: Self-pay | Admitting: *Deleted

## 2012-10-16 ENCOUNTER — Ambulatory Visit (HOSPITAL_BASED_OUTPATIENT_CLINIC_OR_DEPARTMENT_OTHER): Payer: Medicare Other | Admitting: Oncology

## 2012-10-16 VITALS — HR 76 | Temp 97.4°F | Resp 18 | Ht 63.25 in | Wt 176.1 lb

## 2012-10-16 DIAGNOSIS — C50619 Malignant neoplasm of axillary tail of unspecified female breast: Secondary | ICD-10-CM | POA: Diagnosis not present

## 2012-10-16 DIAGNOSIS — C50919 Malignant neoplasm of unspecified site of unspecified female breast: Secondary | ICD-10-CM

## 2012-10-16 DIAGNOSIS — K7689 Other specified diseases of liver: Secondary | ICD-10-CM

## 2012-10-16 DIAGNOSIS — E559 Vitamin D deficiency, unspecified: Secondary | ICD-10-CM

## 2012-10-16 DIAGNOSIS — D0590 Unspecified type of carcinoma in situ of unspecified breast: Secondary | ICD-10-CM

## 2012-10-16 DIAGNOSIS — D490 Neoplasm of unspecified behavior of digestive system: Secondary | ICD-10-CM

## 2012-10-16 LAB — CBC WITH DIFFERENTIAL/PLATELET
Basophils Absolute: 0.1 10*3/uL (ref 0.0–0.1)
Eosinophils Absolute: 0.2 10*3/uL (ref 0.0–0.5)
HCT: 35.9 % (ref 34.8–46.6)
HGB: 12.3 g/dL (ref 11.6–15.9)
LYMPH%: 25.7 % (ref 14.0–49.7)
MONO#: 0.5 10*3/uL (ref 0.1–0.9)
NEUT%: 62 % (ref 38.4–76.8)
Platelets: 210 10*3/uL (ref 145–400)
WBC: 5.8 10*3/uL (ref 3.9–10.3)
lymph#: 1.5 10*3/uL (ref 0.9–3.3)

## 2012-10-16 LAB — COMPREHENSIVE METABOLIC PANEL (CC13)
AST: 15 U/L (ref 5–34)
Albumin: 3.8 g/dL (ref 3.5–5.0)
BUN: 21 mg/dL (ref 7.0–26.0)
CO2: 28 mEq/L (ref 22–29)
Calcium: 10.2 mg/dL (ref 8.4–10.4)
Chloride: 106 mEq/L (ref 98–107)
Glucose: 87 mg/dl (ref 70–99)
Potassium: 4.2 mEq/L (ref 3.5–5.1)

## 2012-10-16 NOTE — Telephone Encounter (Signed)
Gave medical records information to fax over information to [phone]

## 2012-10-16 NOTE — Progress Notes (Signed)
Hematology and Oncology Follow Up Visit  April [name] [id number] Mar 24, 1929 78 y.o. 10/16/2012 12:08 PM PCP  Principle Diagnosis: 78 year old with history of locally advanced breast cancer ER PR positive on neoadjuvant Femara since March 2012 status post bilateral mastectomies September 2012 status post radiation therapy completed January 2013 recent trial of tamoxifen, currently off all anti estrogen meds.completed CW xrt 1/13.  Interim History:  There have been no intercurrent illness, hospitalizations or medication changes. She discontinued tamoxifen about a month ago she did not tolerate side effects she recently had some abdominal pain, and weight loss. GI ordered a CT which showed a rt renal mass ~ 3cm, new from PET 4/12. She is asymptomatic from a urological perspective Medications: I have reviewed the patient's current medications.  Allergies:  Allergies  Allergen Reactions  . Cefaclor     REACTION: unspecified  . Lansoprazole     REACTION: unspecifie  (prevacid)   . Penicillins     REACTION: unspecified  . Sulfa Drugs Cross Reactors   . Sulfamethoxazole     REACTION: unspecified    Past Medical History, Surgical history, Social history, and Family History were reviewed and updated.  Review of Systems: Constitutional:  Negative for fever, chills, night sweats, anorexia, weight loss, pain. Cardiovascular: no chest pain or dyspnea on exertion Respiratory: negative Neurological: negative Dermatological: negative ENT: negative Skin Gastrointestinal: negative Genito-Urinary: negative Hematological and Lymphatic: negative Breast: negative Musculoskeletal: negative Remaining ROS negative.  Physical Exam: Pulse 76, temperature 97.4 F (36.3 C), temperature source Oral, resp. rate 18, height 5' 3.25" (1.607 m), weight 176 lb 1.6 oz (79.878 kg). ECOG: 0 General appearance: alert, cooperative and appears stated age Head: Normocephalic, without obvious abnormality,  atraumatic Neck: no adenopathy, no carotid bruit, no JVD, supple, symmetrical, trachea midline and thyroid not enlarged, symmetric, no tenderness/mass/nodules Lymph nodes: Cervical, supraclavicular, and axillary nodes normal. Cardiac : regular rate and rhythm Pulmonary:clear to auscultation bilaterally and normal percussion bilaterally Breasts: s/p bilateral mrm, sl reddened rt chest wall. Abdomen:soft, non-tender; bowel sounds normal; no masses,  no organomegaly Extremities negative Neuro: alert, oriented, normal speech, no focal findings or movement disorder noted  Lab Results: Lab Results  Component Value Date   WBC 5.8 10/16/2012   HGB 12.3 10/16/2012   HCT 35.9 10/16/2012   MCV 86.2 10/16/2012   PLT 210 10/16/2012     Chemistry      Component Value Date/Time   NA 139 10/16/2012 1104   NA 139 10/08/2012 1119   K 4.2 10/16/2012 1104   K 4.1 10/08/2012 1119   CL 106 10/16/2012 1104   CL 105 10/08/2012 1119   CO2 28 10/16/2012 1104   CO2 29 10/08/2012 1119   BUN 21.0 10/16/2012 1104   BUN 21 10/08/2012 1119   CREATININE 1.0 10/16/2012 1104   CREATININE 0.9 10/08/2012 1119      Component Value Date/Time   CALCIUM 10.2 10/16/2012 1104   CALCIUM 9.5 10/08/2012 1119   ALKPHOS 71 10/16/2012 1104   ALKPHOS 61 10/08/2012 1119   AST 15 10/16/2012 1104   AST 17 10/08/2012 1119   ALT 16 10/16/2012 1104   ALT 16 10/08/2012 1119   BILITOT 0.30 10/16/2012 1104   BILITOT 0.4 10/08/2012 1119      .pathology. Radiological Studies: chest X-ray n/a Mammogram n/a Bone density n/a  Impression and Plan: I recviewed the scans with Texie and gave her a copy of the report; I am not as concwerned about the pancreatic lesion as it  may have been on the previous scan. The liver lesions look like cysts. i have set her up to see urology and interventional radiology. She might be a good candidate for RFA procedure. I will see her in 2 months..  More than 50% of the visit was spent in  patient-related counselling   Pierce Crane, MD 11/19/201312:08 PM

## 2012-10-16 NOTE — Telephone Encounter (Signed)
Gave patient appointment for [facility] at 11-19-2012 at 2:15pm

## 2012-10-19 ENCOUNTER — Telehealth: Payer: Self-pay | Admitting: Oncology

## 2012-10-19 NOTE — Telephone Encounter (Signed)
Faxed medical records to Dr. Dahlstedt °

## 2012-11-29 ENCOUNTER — Ambulatory Visit (INDEPENDENT_AMBULATORY_CARE_PROVIDER_SITE_OTHER): Payer: Medicare Other | Admitting: Family Medicine

## 2012-11-29 ENCOUNTER — Encounter: Payer: Self-pay | Admitting: Family Medicine

## 2012-11-29 VITALS — BP 130/60 | Temp 98.9°F | Wt 176.0 lb

## 2012-11-29 DIAGNOSIS — R350 Frequency of micturition: Secondary | ICD-10-CM | POA: Diagnosis not present

## 2012-11-29 DIAGNOSIS — R42 Dizziness and giddiness: Secondary | ICD-10-CM

## 2012-11-29 DIAGNOSIS — R5381 Other malaise: Secondary | ICD-10-CM

## 2012-11-29 DIAGNOSIS — R5383 Other fatigue: Secondary | ICD-10-CM | POA: Diagnosis not present

## 2012-11-29 DIAGNOSIS — R531 Weakness: Secondary | ICD-10-CM

## 2012-11-29 LAB — BASIC METABOLIC PANEL
Calcium: 9.2 mg/dL (ref 8.4–10.5)
GFR: 62.26 mL/min (ref >60.00)
Sodium: 140 mEq/L (ref 135–145)

## 2012-11-29 LAB — CBC WITH DIFFERENTIAL/PLATELET
Basophils Absolute: 0 10*3/uL (ref 0.0–0.1)
Basophils Relative: 0.8 % (ref 0.0–3.0)
Hemoglobin: 11.3 g/dL — ABNORMAL LOW (ref 12.0–15.0)
Lymphocytes Relative: 21.1 % (ref 12.0–46.0)
Monocytes Relative: 9.4 % (ref 3.0–12.0)
Neutro Abs: 3.6 10*3/uL (ref 1.4–7.7)
RBC: 3.92 Mil/uL (ref 3.87–5.11)
RDW: 14.9 % — ABNORMAL HIGH (ref 11.5–14.6)

## 2012-11-29 LAB — POCT URINALYSIS DIPSTICK
Bilirubin, UA: NEGATIVE
Glucose, UA: NEGATIVE
Nitrite, UA: NEGATIVE

## 2012-11-29 NOTE — Progress Notes (Signed)
Subjective:    Patient ID: April [name], female    DOB: 11/08/1929, 79 y.o.   MRN: [id number]  HPI Acute visit. Patient presents with nonspecific symptoms of dizziness and fatigue. On 11/20/2012 she was out visiting family in [place] and developed nausea and vomiting one day duration. No diarrhea. Those symptoms resolved after about 24 hours. She denies any respiratory symptoms such as cough or nasal congestion. She has some urine frequency but no burning. Possible low-grade fever past couple days. No headaches. No sore throat. She describes dizziness which is inconsistent but occasionally with standing. No vertigo.  Patient has history of breast cancer and has had bilateral mastectomies. She had recent abdominal pelvic CT scan which revealed a 3 cm right renal mass and she has followup with urologist soon to further evaluate.  Past Medical History  Diagnosis Date  . HYPERTENSION 09/30/2009  . GERD 04/18/2007  . LOW BACK PAIN SYNDROME 07/13/2009  . ROTATOR CUFF SYNDROME 05/06/2010  . BURSITIS 01/04/2008  . COSTOCHONDRITIS 11/05/2007  . ALLERGIC RHINITIS 04/18/2007  . Melanoma     removed from upper back  . Breast cancer 2012     Dr. Donnie Coffin, ER/PR positive L breast  . Hx of radiation therapy 10/26/11 to 12/16/11    L chest wall, L supraclavicular region  . Adenomatous colon polyp 11/2005  . Hiatal hernia   . Internal hemorrhoids   . Anemia   . Arthritis   . History of pneumonia   . History of gallstones    Past Surgical History  Procedure Date  . Tonsillectomy and adenoidectomy   . Breast surgery 1968    left bx - noncancerous  . Joint replacement     left knee  . Excision of back melanoma   . Mastectomy 08/25/11    bilateral , per Dr. Dwain Sarna  . Sentinel lymph node biopsy 08/25/11    bilateral  . Cholecystectomy 1979  . Ankle surgery     right  . Cataract extraction     bilateral    reports that she has never smoked. She has never used smokeless tobacco. She reports that she  drinks about 1.5 ounces of alcohol per week. She reports that she does not use illicit drugs. family history includes Alcohol abuse in her sister; Cirrhosis in her sister; Diabetes in her paternal uncle; Retinal detachment in her mother; Stroke in her father; and Thyroid cancer in her mother. Allergies  Allergen Reactions  . Cefaclor     REACTION: unspecified  . Lansoprazole     REACTION: unspecifie  (prevacid)   . Penicillins     REACTION: unspecified  . Sulfa Drugs Cross Reactors   . Sulfamethoxazole     REACTION: unspecified      Review of Systems  Constitutional: Positive for fever and fatigue. Negative for chills.  HENT: Negative for congestion.   Respiratory: Negative for cough and shortness of breath.   Cardiovascular: Negative for chest pain.  Gastrointestinal: Negative for abdominal pain.  Genitourinary: Positive for frequency. Negative for dysuria.  Neurological: Positive for dizziness, weakness and light-headedness. Negative for syncope and headaches.  Hematological: Negative for adenopathy.  Psychiatric/Behavioral: Negative for confusion.       Objective:   Physical Exam  Constitutional: She is oriented to person, place, and time. She appears well-developed and well-nourished.  HENT:  Right Ear: External ear normal.  Left Ear: External ear normal.       Tongue slightly dry otherwise clear  Neck: Neck supple. No  thyromegaly present.  Cardiovascular: Normal rate and regular rhythm.   Pulmonary/Chest: Effort normal and breath sounds normal. No respiratory distress. She has no wheezes. She has no rales.  Abdominal: Soft. Bowel sounds are normal. She exhibits no distension and no mass. There is no tenderness. There is no rebound and no guarding.  Musculoskeletal: She exhibits no edema.       She has some nonpitting edema and enlargement of right lower extremity compared to left which is chronic following previous accident with orthopedic surgery right ankle    Lymphadenopathy:    She has no cervical adenopathy.  Neurological: She is alert and oriented to person, place, and time.          Assessment & Plan:  Patient presents with nonspecific symptoms of malaise, lightheadedness and fatigue following what sounds like probably gastroenteritis. No orthostasis. Check labs with CBC, basic metabolic panel, and urine dipstick. Drink plenty of fluids.

## 2012-11-29 NOTE — Patient Instructions (Addendum)
Drink plenty of fluids

## 2012-11-30 NOTE — Progress Notes (Signed)
Quick Note:  Pt informed ______ 

## 2012-12-03 DIAGNOSIS — N289 Disorder of kidney and ureter, unspecified: Secondary | ICD-10-CM | POA: Diagnosis not present

## 2012-12-04 ENCOUNTER — Other Ambulatory Visit: Payer: Self-pay | Admitting: Urology

## 2012-12-04 DIAGNOSIS — N289 Disorder of kidney and ureter, unspecified: Secondary | ICD-10-CM

## 2012-12-11 ENCOUNTER — Encounter (HOSPITAL_COMMUNITY): Payer: Self-pay | Admitting: Pharmacy Technician

## 2012-12-12 ENCOUNTER — Other Ambulatory Visit: Payer: Self-pay | Admitting: Radiology

## 2012-12-20 ENCOUNTER — Inpatient Hospital Stay (HOSPITAL_COMMUNITY): Admission: RE | Admit: 2012-12-20 | Payer: Medicare Other | Source: Ambulatory Visit

## 2012-12-20 ENCOUNTER — Encounter (HOSPITAL_COMMUNITY): Payer: Self-pay

## 2012-12-20 ENCOUNTER — Ambulatory Visit (HOSPITAL_COMMUNITY)
Admission: RE | Admit: 2012-12-20 | Discharge: 2012-12-20 | Disposition: A | Discharge status: Home / Self Care | Payer: Medicare Other | Source: Ambulatory Visit | Attending: Urology | Admitting: Urology

## 2012-12-20 DIAGNOSIS — N289 Disorder of kidney and ureter, unspecified: Secondary | ICD-10-CM | POA: Diagnosis not present

## 2012-12-20 DIAGNOSIS — D3000 Benign neoplasm of unspecified kidney: Secondary | ICD-10-CM | POA: Diagnosis not present

## 2012-12-20 DIAGNOSIS — D4100 Neoplasm of uncertain behavior of unspecified kidney: Secondary | ICD-10-CM | POA: Insufficient documentation

## 2012-12-20 DIAGNOSIS — K219 Gastro-esophageal reflux disease without esophagitis: Secondary | ICD-10-CM | POA: Diagnosis not present

## 2012-12-20 DIAGNOSIS — N2889 Other specified disorders of kidney and ureter: Secondary | ICD-10-CM | POA: Diagnosis not present

## 2012-12-20 DIAGNOSIS — D4120 Neoplasm of uncertain behavior of unspecified ureter: Secondary | ICD-10-CM | POA: Insufficient documentation

## 2012-12-20 DIAGNOSIS — I10 Essential (primary) hypertension: Secondary | ICD-10-CM | POA: Diagnosis not present

## 2012-12-20 DIAGNOSIS — Z853 Personal history of malignant neoplasm of breast: Secondary | ICD-10-CM | POA: Insufficient documentation

## 2012-12-20 DIAGNOSIS — Z79899 Other long term (current) drug therapy: Secondary | ICD-10-CM | POA: Diagnosis not present

## 2012-12-20 LAB — CBC
MCH: 27.7 pg (ref 26.0–34.0)
Platelets: 213 10*3/uL (ref 150–400)
RBC: 4.4 MIL/uL (ref 3.87–5.11)
RDW: 14.3 % (ref 11.5–15.5)
WBC: 6.8 10*3/uL (ref 4.0–10.5)

## 2012-12-20 LAB — BASIC METABOLIC PANEL
Calcium: 9.7 mg/dL (ref 8.4–10.5)
Creatinine, Ser: 0.87 mg/dL (ref 0.50–1.10)
GFR calc non Af Amer: 61 mL/min — ABNORMAL LOW (ref >90)
Sodium: 137 mEq/L (ref 135–145)

## 2012-12-20 LAB — APTT: aPTT: 31 seconds (ref 24–37)

## 2012-12-20 LAB — PROTIME-INR: Prothrombin Time: 12.6 seconds (ref 11.6–15.2)

## 2012-12-20 MED ORDER — FENTANYL CITRATE 0.05 MG/ML IJ SOLN
INTRAMUSCULAR | Status: AC
  Filled 2012-12-20: qty 6

## 2012-12-20 MED ORDER — HYDROCODONE-ACETAMINOPHEN 5-325 MG PO TABS
1.0000 | ORAL_TABLET | ORAL | Status: DC | PRN
  Filled 2012-12-20: qty 2

## 2012-12-20 MED ORDER — FENTANYL CITRATE 0.05 MG/ML IJ SOLN
INTRAMUSCULAR | Status: AC | PRN
  Administered 2012-12-20: 100 ug via INTRAVENOUS

## 2012-12-20 MED ORDER — SODIUM CHLORIDE 0.9 % IV SOLN
INTRAVENOUS | Status: DC
  Administered 2012-12-20: 12:00:00 via INTRAVENOUS

## 2012-12-20 MED ORDER — MIDAZOLAM HCL 2 MG/2ML IJ SOLN
INTRAMUSCULAR | Status: AC
  Filled 2012-12-20: qty 6

## 2012-12-20 MED ORDER — MIDAZOLAM HCL 2 MG/2ML IJ SOLN
INTRAMUSCULAR | Status: AC | PRN
  Administered 2012-12-20: 1 mg via INTRAVENOUS

## 2012-12-20 NOTE — Procedures (Signed)
CT renal core bx 18g x3 No complication No blood loss. See complete dictation in [facility].

## 2012-12-20 NOTE — Progress Notes (Signed)
Spoke with April [name] in [employer] informing that patient has [place] Muffin at [phone]. States this is okay.

## 2012-12-20 NOTE — H&P (Signed)
April [name] is an 79 y.o. female.   Chief Complaint: I am here for a biopsy of my kidney. HPI: Incidental finding of renal lesion seen on PET scan.  Patient presents today for needle biopsy of lesion to r/o malignancy. Hx of melanoma and breast cancer.   Past Medical History  Diagnosis Date  . HYPERTENSION 09/30/2009  . GERD 04/18/2007  . LOW BACK PAIN SYNDROME 07/13/2009  . ROTATOR CUFF SYNDROME 05/06/2010  . BURSITIS 01/04/2008  . COSTOCHONDRITIS 11/05/2007  . ALLERGIC RHINITIS 04/18/2007  . Melanoma     removed from upper back  . Breast cancer 2012     Dr. Donnie Coffin, ER/PR positive L breast  . Hx of radiation therapy 10/26/11 to 12/16/11    L chest wall, L supraclavicular region  . Adenomatous colon polyp 11/2005  . Hiatal hernia   . Internal hemorrhoids   . Anemia   . Arthritis   . History of pneumonia   . History of gallstones     Past Surgical History  Procedure Date  . Tonsillectomy and adenoidectomy   . Breast surgery 1968    left bx - noncancerous  . Joint replacement     left knee  . Excision of back melanoma   . Mastectomy 08/25/11    bilateral , per Dr. Dwain Sarna  . Sentinel lymph node biopsy 08/25/11    bilateral  . Cholecystectomy 1979  . Ankle surgery     right  . Cataract extraction     bilateral    Family History  Problem Relation Age of Onset  . Stroke Father   . Alcohol abuse Sister   . Thyroid cancer Mother   . Retinal detachment Mother   . Diabetes Paternal Uncle   . Cirrhosis Sister    Social History:  reports that she has never smoked. She has never used smokeless tobacco. She reports that she drinks about 1.5 ounces of alcohol per week. She reports that she does not use illicit drugs.    Medication List     As of 12/20/2012  1:30 PM    ASK your doctor about these medications         aspirin EC 81 MG tablet   Take 81 mg by mouth at bedtime.      ibuprofen 200 MG tablet   Commonly known as: ADVIL,MOTRIN   Take 400 mg by mouth 2 (two) times  daily.      lisinopril 20 MG tablet   Commonly known as: PRINIVIL,ZESTRIL   Take 20 mg by mouth at bedtime.      omeprazole 40 MG capsule   Commonly known as: PRILOSEC   Take 40 mg by mouth 2 (two) times daily.      OVER THE COUNTER MEDICATION   Take 1 Wafer by mouth daily. Metamucil Fiber Supplement Wafer.      polyethylene glycol powder powder   Commonly known as: GLYCOLAX/MIRALAX   Take 17 g by mouth daily as needed. For constipation.      sodium chloride 0.65 % nasal spray   Commonly known as: OCEAN   Place 1 spray into the nose at bedtime.      SYSTANE 0.4-0.3 % Soln   Generic drug: Polyethyl Glycol-Propyl Glycol   Place 1 drop into both eyes 2 (two) times daily.           Allergies:  Allergies  Allergen Reactions  . Cefaclor     REACTION: unspecified  . Lansoprazole  REACTION: unspecifie  (prevacid)   . Penicillins     REACTION: unspecified  . Sulfa Drugs Cross Reactors Other (See Comments)    Purple whelps, itch, "just go crazy"  . Sulfamethoxazole     REACTION: unspecified     Results for orders placed during the hospital encounter of 12/20/12 (from the past 48 hour(s))  APTT     Status: Normal   Collection Time   12/20/12 12:05 PM      Component Value Range Comment   aPTT 31  24 - 37 seconds   BASIC METABOLIC PANEL     Status: Abnormal   Collection Time   12/20/12 12:05 PM      Component Value Range Comment   Sodium 137  135 - 145 mEq/L    Potassium 3.9  3.5 - 5.1 mEq/L    Chloride 101  96 - 112 mEq/L    CO2 26  19 - 32 mEq/L    Glucose, Bld 94  70 - 99 mg/dL    BUN 27 (*) 6 - 23 mg/dL    Creatinine, Ser 9.52  0.50 - 1.10 mg/dL    Calcium 9.7  8.4 - 84.1 mg/dL    GFR calc non Af Amer 61 (*) >90 mL/min    GFR calc Af Amer 70 (*) >90 mL/min   CBC     Status: Normal   Collection Time   12/20/12 12:05 PM      Component Value Range Comment   WBC 6.8  4.0 - 10.5 K/uL    RBC 4.40  3.87 - 5.11 MIL/uL    Hemoglobin 12.2  12.0 - 15.0 g/dL     HCT 32.4  40.1 - 02.7 %    MCV 85.9  78.0 - 100.0 fL    MCH 27.7  26.0 - 34.0 pg    MCHC 32.3  30.0 - 36.0 g/dL    RDW 25.3  66.4 - 40.3 %    Platelets 213  150 - 400 K/uL   PROTIME-INR     Status: Normal   Collection Time   12/20/12 12:05 PM      Component Value Range Comment   Prothrombin Time 12.6  11.6 - 15.2 seconds    INR 0.95  0.00 - 1.49     Review of Systems  Constitutional: Negative.   Respiratory: Negative for cough, hemoptysis, sputum production and shortness of breath.   Cardiovascular: Negative for chest pain, palpitations and orthopnea.  Gastrointestinal: Positive for heartburn. Negative for nausea, vomiting and abdominal pain.       Bloating/ gas    Skin: Negative.   Neurological: Negative.    Physical Exam  Constitutional: She is oriented to person, place, and time. She appears well-developed and well-nourished. No distress.  HENT:  Head: Normocephalic and atraumatic.  Cardiovascular: Normal rate and regular rhythm.  Exam reveals no gallop and no friction rub.   Murmur heard. Respiratory: Effort normal and breath sounds normal. No respiratory distress. She has no wheezes. She has no rales.  GI: Soft. Bowel sounds are normal. She exhibits no distension.  Musculoskeletal: Normal range of motion. She exhibits no edema.  Neurological: She is alert and oriented to person, place, and time.  Skin: Skin is warm and dry.  Psychiatric: She has a normal mood and affect. Her behavior is normal. Judgment and thought content normal.     Assessment/Plan Procedure details for renal lesion needle biopsy discussed in detail with patient - potential complications discussed in  detail were that of bleeding, inadequate sampling, injury to organ and complications with moderate sedation with patient's questions answered to her satisfaction. Written consent obtained.     CAMPBELL,PAMELA D 12/20/2012, 1:30 PM

## 2012-12-21 ENCOUNTER — Encounter: Payer: Self-pay | Admitting: *Deleted

## 2012-12-21 DIAGNOSIS — C50919 Malignant neoplasm of unspecified site of unspecified female breast: Secondary | ICD-10-CM

## 2012-12-21 NOTE — Progress Notes (Signed)
Called and spoke with patient to get her scheduled with a new provider.  Confirmed appt. For 1/28 at 1230 lab and 1pm with Dr. Darnelle Catalan.  Patient had biopsy 1/23.

## 2012-12-24 ENCOUNTER — Ambulatory Visit (INDEPENDENT_AMBULATORY_CARE_PROVIDER_SITE_OTHER): Payer: Medicare Other | Admitting: Family Medicine

## 2012-12-24 ENCOUNTER — Encounter: Payer: Self-pay | Admitting: Oncology

## 2012-12-24 ENCOUNTER — Encounter: Payer: Self-pay | Admitting: Family Medicine

## 2012-12-24 VITALS — BP 130/64 | HR 83 | Temp 98.9°F | Wt 172.0 lb

## 2012-12-24 DIAGNOSIS — C50919 Malignant neoplasm of unspecified site of unspecified female breast: Secondary | ICD-10-CM | POA: Diagnosis not present

## 2012-12-24 DIAGNOSIS — N390 Urinary tract infection, site not specified: Secondary | ICD-10-CM | POA: Diagnosis not present

## 2012-12-24 DIAGNOSIS — E559 Vitamin D deficiency, unspecified: Secondary | ICD-10-CM | POA: Diagnosis not present

## 2012-12-24 LAB — POCT URINALYSIS DIPSTICK
Spec Grav, UA: 1.01
Urobilinogen, UA: 0.2
pH, UA: 6

## 2012-12-24 MED ORDER — SOLIFENACIN SUCCINATE 10 MG PO TABS: 10.0000 mg | ORAL_TABLET | Freq: Every day | ORAL | Status: DC

## 2012-12-24 MED ORDER — CIPROFLOXACIN HCL 500 MG PO TABS: 500.0000 mg | ORAL_TABLET | Freq: Two times a day (BID) | ORAL | Status: DC

## 2012-12-24 NOTE — Progress Notes (Signed)
  Subjective:    Patient ID: April [name], female    DOB: 10-01-1929, 79 y.o.   MRN: [id number]  HPI Here for 3 days of lower back pain, fever to101.5 degrees, and urgency and frequency of urinations. No burning. No change in BMs. She took a dose of Macrobid last night.    Review of Systems  Constitutional: Positive for fever.  Gastrointestinal: Negative.   Genitourinary: Positive for urgency and frequency. Negative for dysuria, hematuria and flank pain.  Musculoskeletal: Positive for back pain.       Objective:   Physical Exam  Constitutional: She appears well-developed and well-nourished.  Abdominal: Soft. Bowel sounds are normal. She exhibits no distension and no mass. There is no tenderness. There is no rebound and no guarding.          Assessment & Plan:  Treat with Cipro. Drink lots of water.

## 2012-12-24 NOTE — Addendum Note (Signed)
Addended by: Aniceto Boss A on: 12/24/2012 11:38 AM   Modules accepted: Orders

## 2012-12-25 ENCOUNTER — Ambulatory Visit: Payer: Medicare Other | Admitting: Oncology

## 2012-12-25 ENCOUNTER — Ambulatory Visit (HOSPITAL_BASED_OUTPATIENT_CLINIC_OR_DEPARTMENT_OTHER): Payer: Medicare Other | Admitting: Oncology

## 2012-12-25 ENCOUNTER — Other Ambulatory Visit (HOSPITAL_BASED_OUTPATIENT_CLINIC_OR_DEPARTMENT_OTHER): Payer: Medicare Other | Admitting: Lab

## 2012-12-25 ENCOUNTER — Telehealth: Payer: Self-pay | Admitting: Oncology

## 2012-12-25 ENCOUNTER — Other Ambulatory Visit: Payer: Medicare Other | Admitting: Lab

## 2012-12-25 VITALS — BP 117/69 | HR 80 | Temp 98.0°F | Resp 20 | Ht 62.0 in | Wt 179.0 lb

## 2012-12-25 DIAGNOSIS — C50619 Malignant neoplasm of axillary tail of unspecified female breast: Secondary | ICD-10-CM

## 2012-12-25 DIAGNOSIS — K7689 Other specified diseases of liver: Secondary | ICD-10-CM | POA: Diagnosis not present

## 2012-12-25 DIAGNOSIS — D369 Benign neoplasm, unspecified site: Secondary | ICD-10-CM

## 2012-12-25 DIAGNOSIS — Z85820 Personal history of malignant melanoma of skin: Secondary | ICD-10-CM | POA: Diagnosis not present

## 2012-12-25 DIAGNOSIS — D0590 Unspecified type of carcinoma in situ of unspecified breast: Secondary | ICD-10-CM

## 2012-12-25 DIAGNOSIS — C50919 Malignant neoplasm of unspecified site of unspecified female breast: Secondary | ICD-10-CM

## 2012-12-25 LAB — CBC WITH DIFFERENTIAL/PLATELET
BASO%: 0.5 % (ref 0.0–2.0)
EOS%: 6.5 % (ref 0.0–7.0)
HCT: 33.7 % — ABNORMAL LOW (ref 34.8–46.6)
LYMPH%: 15 % (ref 14.0–49.7)
MCH: 28.5 pg (ref 25.1–34.0)
MCHC: 33.6 g/dL (ref 31.5–36.0)
MONO#: 0.6 10*3/uL (ref 0.1–0.9)
MONO%: 8 % (ref 0.0–14.0)
NEUT%: 70 % (ref 38.4–76.8)
Platelets: 175 10*3/uL (ref 145–400)
RBC: 3.97 10*6/uL (ref 3.70–5.45)
WBC: 7 10*3/uL (ref 3.9–10.3)

## 2012-12-25 LAB — COMPREHENSIVE METABOLIC PANEL (CC13)
Albumin: 3.4 g/dL — ABNORMAL LOW (ref 3.5–5.0)
BUN: 25.4 mg/dL (ref 7.0–26.0)
Calcium: 9.6 mg/dL (ref 8.4–10.4)
Creatinine: 1.1 mg/dL (ref 0.6–1.1)
Total Protein: 6.5 g/dL (ref 6.4–8.3)

## 2012-12-25 LAB — CANCER ANTIGEN 27.29: CA 27.29: 12 U/mL (ref 0–39)

## 2012-12-25 LAB — LACTATE DEHYDROGENASE (CC13): LDH: 129 U/L (ref 125–245)

## 2012-12-25 NOTE — Telephone Encounter (Signed)
gv pt appt schedule for July. Pt aware [facility] will call re July mri appt.

## 2012-12-25 NOTE — Progress Notes (Signed)
ID: April [name]   DOB: 1928-12-08  MR#: [id number]  WGN#:[id number]  PCP: April Salisbury, MD GYN:  SUEmelia [name] OTHER MD: April [name], April [name], April [name], [facility]   HISTORY OF PRESENT ILLNESS: According to Dr. Renelda [name] 02/23/2011 note: She presented with a palpable mass in her left breast.  She was referred for a mammogram on 02/14/2011.  At that time diagnostic mammogram and ultrasound showed scattered fibroglandular densities in the lateral aspect of the left breast there was an irregular spiculated mass measuring 3.5 x 4.1 x 3.8 cm.  The physical exam at that time showed a hard palpable mass at 3 o'clock position in the left breast with some skin retraction.  Ultrasound was performed, which showed a hypoechoic mass with shadowing 10 cm from the nipple measuring 2.2 x 2.0 mm.  In the right breast there was two hypoechoic masses at 12 o'clock position 3 cm from the nipple measuring 5 x 5 mm each.  Biopsies recommended.  Biopsy on the suspicious lymph node was done at the same time is the primary mass on 02/14/2011.  Pathology showed this to be invasive mammary carcinoma.  The left axillary mass has insufficient tissue for diagnosis, the tumor was ER and PR positive, 100% strong staining intensity, proliferative index low at 36%.  The HER-2 ratio was 1.19.  The patient underwent a MRI scan of both breasts on 02/21/2011.  In the tail of the left breast there was a mass measuring 2.7 x 2.6 x 2.9 cm medial margin of both pectoralis muscle.  In the right breast there was clumped nodular enhancement at 12 o'clock position.  This area measured 1.8 cm.  Sentinel left axillary lymph node was also noted, which appeared to be suspicious. Her subsequent history is as detailed below.  INTERVAL HISTORY:  REVIEW OF SYSTEMS:  PAST MEDICAL HISTORY: Past Medical History  Diagnosis Date  . HYPERTENSION 09/30/2009  . GERD 04/18/2007  . LOW BACK PAIN SYNDROME 07/13/2009  . ROTATOR CUFF SYNDROME  05/06/2010  . BURSITIS 01/04/2008  . COSTOCHONDRITIS 11/05/2007  . ALLERGIC RHINITIS 04/18/2007  . Melanoma     removed from upper back  . Breast cancer 2012     Dr. Donnie [name], ER/PR positive L breast  . Hx of radiation therapy 10/26/11 to 12/16/11    L chest wall, L supraclavicular region  . Adenomatous colon polyp 11/2005  . Hiatal hernia   . Internal hemorrhoids   . Anemia   . Arthritis   . History of pneumonia   . History of gallstones     PAST SURGICAL HISTORY: Past Surgical History  Procedure Date  . Tonsillectomy and adenoidectomy   . Breast surgery 1968    left bx - noncancerous  . Joint replacement     left knee  . Excision of back melanoma   . Mastectomy 08/25/11    bilateral , per Dr. Dwain [name]  . Sentinel lymph node biopsy 08/25/11    bilateral  . Cholecystectomy 1979  . Ankle surgery     right  . Cataract extraction     bilateral    FAMILY HISTORY Family History  Problem Relation Age of Onset  . Stroke Father   . Alcohol abuse Sister   . Thyroid cancer Mother   . Retinal detachment Mother   . Diabetes Paternal Uncle   . Cirrhosis Sister    the patient's father died at the age of 79 following a stroke. He had an independent in driving up  to one month prior to dying. [name] died from thyroid cancer at the age of 71. This had been diagnosed less than a year before her death. The patient had one brother and one sister. There is no history of breast or ovarian cancer in the family to her knowledge.  GYNECOLOGIC HISTORY: Menarche age 19, first live birth age 65, she is GX P2. She underwent menopause around 81. She did not take hormone replacement.  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 Her husband April [name] is a retired Acupuncturist. Unfortunately she tells me he is severely demented at present. She is the primary caregiver. Son April [name] the third lives in [place] and works for the [employer]. Daughter April [name] lives in [place] and works with a [employer]. The patient has 3 grandchildren. She attends a [employer].   ADVANCED DIRECTIVES: In place  HEALTH MAINTENANCE: History  Substance Use Topics  . Smoking status: Never Smoker   . Smokeless tobacco: Never Used  . Alcohol Use: 0.5 oz/week    1 drink(s) per week     Comment: socially     Colonoscopy:  PAP:  Bone density:  Lipid panel:  Allergies  Allergen Reactions  . Cefaclor     REACTION: unspecified  . Lansoprazole     REACTION: unspecifie  (prevacid)   . Penicillins     REACTION: unspecified  . Sulfa Drugs Cross Reactors Other (See Comments)    Purple whelps, itch, "just go crazy"  . Sulfamethoxazole     REACTION: unspecified    Current Outpatient Prescriptions  Medication Sig Dispense Refill  . aspirin EC 81 MG tablet Take 81 mg by mouth at bedtime.      . ciprofloxacin (CIPRO) 500 MG tablet Take 1 tablet (500 mg total) by mouth 2 (two) times daily.  14 tablet  0  . ibuprofen (ADVIL,MOTRIN) 200 MG tablet Take 400 mg by mouth 2 (two) times daily.       Marland Kitchen lisinopril (PRINIVIL,ZESTRIL) 20 MG tablet Take 20 mg by mouth at bedtime.      Marland Kitchen omeprazole (PRILOSEC) 40 MG capsule Take 40 mg by mouth 2 (two) times daily.      Marland Kitchen OVER THE COUNTER MEDICATION Take 1 Wafer by mouth daily. Metamucil Fiber Supplement Wafer.      Bertram Gala Glycol-Propyl Glycol (SYSTANE) 0.4-0.3 % SOLN Place 1 drop into both eyes 2 (two) times daily.      . polyethylene glycol powder (GLYCOLAX/MIRALAX) powder Take 17 g by mouth daily as needed. For constipation.      . sodium chloride (OCEAN) 0.65 % nasal spray Place 1 spray into the nose at bedtime.      . solifenacin (VESICARE) 10 MG tablet Take 1 tablet (10 mg total) by mouth daily.  30 tablet  11    OBJECTIVE: Elderly white woman in no acute distress Filed Vitals:   12/25/12 1306  BP: 117/69  Pulse: 80  Temp: 98 F (36.7 C)  Resp:  20     Body mass index is 32.74 kg/(m^2).    ECOG FS: 1  Sclerae unicteric Oropharynx clear No cervical or supraclavicular adenopathy Lungs no rales or rhonchi Heart regular rate and rhythm Abd benign MSK no focal spinal tenderness, no peripheral edema Neuro: nonfocal, well oriented, appropriate affect Breasts: Status post bilateral mastectomies. There is no evidence of local recurrence. There is some telangiectasias from the radiation in  the left axilla, which is otherwise benign.   LAB RESULTS: Lab Results  Component Value Date   WBC 7.0 12/25/2012   NEUTROABS 4.9 12/25/2012   HGB 11.3* 12/25/2012   HCT 33.7* 12/25/2012   MCV 84.9 12/25/2012   PLT 175 12/25/2012      Chemistry      Component Value Date/Time   NA 137 12/20/2012 1205   NA 139 10/16/2012 1104   K 3.9 12/20/2012 1205   K 4.2 10/16/2012 1104   CL 101 12/20/2012 1205   CL 106 10/16/2012 1104   CO2 26 12/20/2012 1205   CO2 28 10/16/2012 1104   BUN 27* 12/20/2012 1205   BUN 21.0 10/16/2012 1104   CREATININE 0.87 12/20/2012 1205   CREATININE 1.0 10/16/2012 1104      Component Value Date/Time   CALCIUM 9.7 12/20/2012 1205   CALCIUM 10.2 10/16/2012 1104   ALKPHOS 71 10/16/2012 1104   ALKPHOS 61 10/08/2012 1119   AST 15 10/16/2012 1104   AST 17 10/08/2012 1119   ALT 16 10/16/2012 1104   ALT 16 10/08/2012 1119   BILITOT 0.30 10/16/2012 1104   BILITOT 0.4 10/08/2012 1119       Lab Results  Component Value Date   LABCA2 16 05/23/2011   LABCA2 16 05/23/2011   LABCA2 16 05/23/2011    No components found with this basename: [id number]     Lab 12/20/12 1205  INR 0.95    Urinalysis    Component Value Date/Time   COLORURINE yellow 05/24/2010 1555   APPEARANCEUR Clear 05/24/2010 1555   LABSPEC <1.005 05/24/2010 1555   PHURINE 5.5 05/24/2010 1555   GLUCOSEU NEGATIVE 08/19/2009 1918   HGBUR negative 05/24/2010 1555   HGBUR NEGATIVE 08/19/2009 1918   BILIRUBINUR neg 12/24/2012 1137   BILIRUBINUR negative 05/24/2010  1555   KETONESUR NEGATIVE 08/19/2009 1918   PROTEINUR NEGATIVE 08/19/2009 1918   UROBILINOGEN 0.2 12/24/2012 1137   UROBILINOGEN 0.2 05/24/2010 1555   NITRITE neg 12/24/2012 1137   NITRITE negative 05/24/2010 1555   LEUKOCYTESUR Negative 12/24/2012 1137    STUDIES: Ct Biopsy  12/21/2012  *RADIOLOGY REPORT*  Clinical data:  Right renal mass.  History of melanoma and breast carcinoma.  CT-GUIDED RIGHT RENAL MASS CORE BIOPSY  Technique and findings: The procedure, risks (including but not limited to bleeding, infection, organ damage), benefits, and alternatives were explained to the patient.  Questions regarding the procedure were encouraged and answered.  The patient understands and consents to the procedure.Select axial scans through the abdomen were obtained and the lesion was localized. Operator donned sterile gloves and mask.   Site was marked, prepped with Betadine, draped in usual sterile fashion, infiltrated locally with 1% lidocaine.  Intravenous Fentanyl and Versed were administered as conscious sedation during continuous cardiorespiratory monitoring by the radiology RN, with a total moderate sedation time of 10 minutes.  Under CT fluoroscopic guidance, a 17 gauge trocar needle was advanced to the margin of the lesion.  Once needle tip position was confirmed, coaxial 18-gauge core biopsy samples were obtained, submitted in formalin to  surgical pathology.  The guide needle was removed. Postprocedure scans show a minimal   hematoma along the needle tract. The patient tolerated the procedure well.  No immediate complication.  IMPRESSION: Technically successful CT-guided core biopsy of right renal mass.   Original Report Authenticated By: D. Andria Rhein, MD     ASSESSMENT: 79 y.o. 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  (3) left postmastectomy radiation completed 12/27/2011  (4) took aromatase inhibitors (letrozole and anastrozole) prior to her bilateral mastectomies, with poor tolerance. Took tamoxifen briefly after completing radiation, and again with poor tolerance. 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 She is done with her melanoma and with her ductal carcinoma in situ. She is at risk for recurrence of her breast cancer, but has not been able to tolerate anti-estrogens, so the plan there is to follow and start fulvestrant if and when we can document stage IV disease. As far as the oncocytoma is concerned, she tells me she spoke with Dr. Retta Diones and the plan is for followup only. I am entirely comfortable with that decision.  The question then is how to follow her. It would be reasonable to see her every 6 months with lab work and physical exam. She would like a little more intensive followup initially, chiefly because of concerns regarding some cystic changes in her pancreas. Accordingly we are going to repeat an MRI of the abdomen in 6 and 12 months, with special attention to the pancreas and right kidney areas. After that we will consider yearly imaging. Of course we will aggressively evaluate any new symptoms have developed.  I think she would benefit from reading "The 36-hour Day" and I wrote that down for her. She knows to call for any problems that may develop before her next visit here.   Kareema Keitt C    12/25/2012

## 2012-12-26 ENCOUNTER — Other Ambulatory Visit: Payer: Self-pay | Admitting: *Deleted

## 2012-12-26 DIAGNOSIS — C50919 Malignant neoplasm of unspecified site of unspecified female breast: Secondary | ICD-10-CM

## 2012-12-28 ENCOUNTER — Other Ambulatory Visit: Payer: Self-pay | Admitting: *Deleted

## 2012-12-28 ENCOUNTER — Telehealth: Payer: Self-pay | Admitting: Oncology

## 2012-12-28 DIAGNOSIS — C50919 Malignant neoplasm of unspecified site of unspecified female breast: Secondary | ICD-10-CM

## 2012-12-28 NOTE — Telephone Encounter (Signed)
Per [facility] pt needs open machine for mri - should be scheduled @ [facility]. S/w pt today re appt for 7/22 @ 11:30am. Pt wants to keep lb 7/22 and move mri to 7/23 early AM.

## 2013-01-14 ENCOUNTER — Ambulatory Visit (INDEPENDENT_AMBULATORY_CARE_PROVIDER_SITE_OTHER): Payer: Medicare Other | Admitting: Family Medicine

## 2013-01-14 ENCOUNTER — Encounter: Payer: Self-pay | Admitting: Family Medicine

## 2013-01-14 VITALS — BP 104/68 | HR 81 | Temp 98.4°F | Wt 176.0 lb

## 2013-01-14 DIAGNOSIS — J069 Acute upper respiratory infection, unspecified: Secondary | ICD-10-CM | POA: Diagnosis not present

## 2013-01-14 NOTE — Progress Notes (Signed)
Chief Complaint  Patient presents with  . URI    low grade fever, deep cough, sore throat, started with sinues drained down throat and now in chest, mucus yellow x  2 days     HPI:  Acute visit for URI: -started: days -symptoms: nasal congestion, cough, sore throat, drainage in throat, yellow mucus, low grade temp of 99 -denies: fever, sinus or tooth pain -has tried: robitussin - helps -hx of: no hx of chronic lung disease  ROS: See pertinent positives and negatives per HPI.  Past Medical History  Diagnosis Date  . HYPERTENSION 09/30/2009  . GERD 04/18/2007  . LOW BACK PAIN SYNDROME 07/13/2009  . ROTATOR CUFF SYNDROME 05/06/2010  . BURSITIS 01/04/2008  . COSTOCHONDRITIS 11/05/2007  . ALLERGIC RHINITIS 04/18/2007  . Melanoma     removed from upper back  . Breast cancer 2012     Dr. Donnie Coffin, ER/PR positive L breast  . Hx of radiation therapy 10/26/11 to 12/16/11    L chest wall, L supraclavicular region  . Adenomatous colon polyp 11/2005  . Hiatal hernia   . Internal hemorrhoids   . Anemia   . Arthritis   . History of pneumonia   . History of gallstones     Family History  Problem Relation Age of Onset  . Stroke Father   . Alcohol abuse Sister   . Thyroid cancer Mother   . Retinal detachment Mother   . Diabetes Paternal Uncle   . Cirrhosis Sister     History   Social History  . Marital Status: Married    Spouse Name: N/A    Number of Children: 2  . Years of Education: N/A   Occupational History  . retired    Social History Main Topics  . Smoking status: Never Smoker   . Smokeless tobacco: Never Used  . Alcohol Use: 0.5 oz/week    1 drink(s) per week     Comment: socially  . Drug Use: No  . Sexually Active: None   Other Topics Concern  . None   Social History Narrative  . None    Current outpatient prescriptions:aspirin EC 81 MG tablet, Take 81 mg by mouth at bedtime., Disp: , Rfl: ;  ibuprofen (ADVIL,MOTRIN) 200 MG tablet, Take 400 mg by mouth 2 (two)  times daily. , Disp: , Rfl: ;  lisinopril (PRINIVIL,ZESTRIL) 20 MG tablet, Take 20 mg by mouth at bedtime., Disp: , Rfl: ;  OVER THE COUNTER MEDICATION, Take 1 Wafer by mouth daily. Metamucil Fiber Supplement Wafer., Disp: , Rfl:  Polyethyl Glycol-Propyl Glycol (SYSTANE) 0.4-0.3 % SOLN, Place 1 drop into both eyes 2 (two) times daily., Disp: , Rfl: ;  polyethylene glycol powder (GLYCOLAX/MIRALAX) powder, Take 17 g by mouth daily as needed. For constipation., Disp: , Rfl: ;  sodium chloride (OCEAN) 0.65 % nasal spray, Place 1 spray into the nose at bedtime., Disp: , Rfl:   EXAM:  Filed Vitals:   01/14/13 1404  BP: 104/68  Pulse: 81  Temp: 98.4 F (36.9 C)    Body mass index is 32.18 kg/(m^2).  GENERAL: vitals reviewed and listed above, alert, oriented, appears well hydrated and in no acute distress  HEENT: atraumatic, conjunttiva clear, no obvious abnormalities on inspection of external nose and ears, normal appearance of ear canals and TMs, clear nasal congestion, mild post oropharyngeal erythema with PND, no tonsillar edema or exudate, no sinus TTP  NECK: no obvious masses on inspection  LUNGS: clear to auscultation bilaterally, no  wheezes, rales or rhonchi, good air movement  CV: HRRR, no peripheral edema  MS: moves all extremities without noticeable abnormality  PSYCH: pleasant and cooperative, no obvious depression or anxiety  ASSESSMENT AND PLAN:  Discussed the following assessment and plan:  Acute upper respiratory infections of unspecified site -discussed that this is most likely a viral infection -advised supportive care and return precautions -offered cough medication - but robitussin is working for her -Patient advised to return or notify a doctor immediately if symptoms worsen or persist or new concerns arise.  Patient Instructions  INSTRUCTIONS FOR UPPER RESPIRATORY INFECTION:  -plenty of rest and fluids  -nasal saline wash 2-3 times daily (use prepackaged  nasal saline or bottled/distilled water if making your own)   -can use sinex/afrin nasal spray for drainage and nasal congestion - but do NOT use longer then 3-4 days  -can use tylenol or ibuprofen as directed for aches and sorethroat  -in the winter time, using a humidifier at night is helpful (please follow cleaning instructions)  -if you are taking a cough medication - use only as directed, may also try a teaspoon of honey to coat the throat and throat lozenges  -for sore throat, salt water gargles can help  -follow up if you have fevers, facial pain, tooth pain, difficulty breathing or are worsening or not getting better in 5-7 days      Rhyder Koegel R.

## 2013-01-14 NOTE — Patient Instructions (Signed)
INSTRUCTIONS FOR UPPER RESPIRATORY INFECTION:  -plenty of rest and fluids  -nasal saline wash 2-3 times daily (use prepackaged nasal saline or bottled/distilled water if making your own)   -can use sinex/afrin nasal spray for drainage and nasal congestion - but do NOT use longer then 3-4 days  -can use tylenol or ibuprofen as directed for aches and sorethroat  -in the winter time, using a humidifier at night is helpful (please follow cleaning instructions)  -if you are taking a cough medication - use only as directed, may also try a teaspoon of honey to coat the throat and throat lozenges  -for sore throat, salt water gargles can help  -follow up if you have fevers, facial pain, tooth pain, difficulty breathing or are worsening or not getting better in 5-7 days  

## 2013-02-08 ENCOUNTER — Ambulatory Visit (INDEPENDENT_AMBULATORY_CARE_PROVIDER_SITE_OTHER): Payer: Medicare Other | Admitting: Family Medicine

## 2013-02-08 ENCOUNTER — Encounter: Payer: Self-pay | Admitting: Family Medicine

## 2013-02-08 VITALS — BP 144/78 | HR 78 | Temp 99.6°F | Wt 174.0 lb

## 2013-02-08 DIAGNOSIS — J069 Acute upper respiratory infection, unspecified: Secondary | ICD-10-CM

## 2013-02-08 MED ORDER — HYDROCODONE-HOMATROPINE 5-1.5 MG/5ML PO SYRP: 5.0000 mL | ORAL_SOLUTION | Freq: Three times a day (TID) | ORAL | Status: DC | PRN

## 2013-02-08 MED ORDER — AZITHROMYCIN 250 MG PO TABS: ORAL_TABLET | ORAL | Status: DC

## 2013-02-08 NOTE — Progress Notes (Signed)
Chief Complaint  Patient presents with  . Cough    fatigue, low grade fever     HPI:  -started: about 2 weeks ago, worse over last week -symptoms:nasal congestion, sore throat, cough -denies:fever, SOB, NVD, tooth pain -has tried: nothing -sick contacts: husband has dementia and she is exhausted taking care of him -Hx of: no chronic lung disease - has tolerated zpack well in the past when had similar symptoms   ROS: See pertinent positives and negatives per HPI.  Past Medical History  Diagnosis Date  . HYPERTENSION 09/30/2009  . GERD 04/18/2007  . LOW BACK PAIN SYNDROME 07/13/2009  . ROTATOR CUFF SYNDROME 05/06/2010  . BURSITIS 01/04/2008  . COSTOCHONDRITIS 11/05/2007  . ALLERGIC RHINITIS 04/18/2007  . Melanoma     removed from upper back  . Breast cancer 2012     Dr. Donnie Coffin, ER/PR positive L breast  . Hx of radiation therapy 10/26/11 to 12/16/11    L chest wall, L supraclavicular region  . Adenomatous colon polyp 11/2005  . Hiatal hernia   . Internal hemorrhoids   . Anemia   . Arthritis   . History of pneumonia   . History of gallstones     Family History  Problem Relation Age of Onset  . Stroke Father   . Alcohol abuse Sister   . Thyroid cancer Mother   . Retinal detachment Mother   . Diabetes Paternal Uncle   . Cirrhosis Sister     History   Social History  . Marital Status: Married    Spouse Name: N/A    Number of Children: 2  . Years of Education: N/A   Occupational History  . retired    Social History Main Topics  . Smoking status: Never Smoker   . Smokeless tobacco: Never Used  . Alcohol Use: 0.5 oz/week    1 drink(s) per week     Comment: socially  . Drug Use: No  . Sexually Active: None   Other Topics Concern  . None   Social History Narrative  . None    Current outpatient prescriptions:aspirin EC 81 MG tablet, Take 81 mg by mouth at bedtime., Disp: , Rfl: ;  ibuprofen (ADVIL,MOTRIN) 200 MG tablet, Take 400 mg by mouth 2 (two) times daily. ,  Disp: , Rfl: ;  lisinopril (PRINIVIL,ZESTRIL) 20 MG tablet, Take 20 mg by mouth at bedtime., Disp: , Rfl: ;  OVER THE COUNTER MEDICATION, Take 1 Wafer by mouth daily. Metamucil Fiber Supplement Wafer., Disp: , Rfl:  Polyethyl Glycol-Propyl Glycol (SYSTANE) 0.4-0.3 % SOLN, Place 1 drop into both eyes 2 (two) times daily., Disp: , Rfl: ;  polyethylene glycol powder (GLYCOLAX/MIRALAX) powder, Take 17 g by mouth daily as needed. For constipation., Disp: , Rfl: ;  sodium chloride (OCEAN) 0.65 % nasal spray, Place 1 spray into the nose at bedtime., Disp: , Rfl:  azithromycin (ZITHROMAX) 250 MG tablet, 2 tabs on the fist day, then one tab daily for 4 days, Disp: 6 tablet, Rfl: 0;  HYDROcodone-homatropine (HYCODAN) 5-1.5 MG/5ML syrup, Take 5 mLs by mouth every 8 (eight) hours as needed for cough., Disp: 120 mL, Rfl: 0  EXAM:  Filed Vitals:   02/08/13 1007  BP: 144/78  Pulse: 78  Temp: 99.6 F (37.6 C)    Body mass index is 31.82 kg/(m^2).  GENERAL: vitals reviewed and listed above, alert, oriented, appears well hydrated and in no acute distress  HEENT: atraumatic, conjunttiva clear, no obvious abnormalities on inspection of external  nose and ears, normal appearance of ear canals and TMs, clear nasal congestion, mild post oropharyngeal erythema with PND, no tonsillar edema or exudate, no sinus TTP  NECK: no obvious masses on inspection  LUNGS: clear to auscultation bilaterally, no wheezes, rales or rhonchi, good air movement  CV: HRRR, no peripheral edema  MS: moves all extremities without noticeable abnormality  PSYCH: pleasant and cooperative, no obvious depression or anxiety  ASSESSMENT AND PLAN:  Discussed the following assessment and plan:  Upper respiratory infection - Plan: azithromycin (ZITHROMAX) 250 MG tablet, HYDROcodone-homatropine (HYCODAN) 5-1.5 MG/5ML syrup  -ongoing for several weeks and worsening so abx warrented in case bacterial, explained if viral this will not help and  discussed risks. Pt also wants codeine cough medication - discussed risks and advised not to drive after taking this medication. -Patient advised to return or notify a doctor immediately if symptoms worsen or persist or new concerns arise.  There are no Patient Instructions on file for this visit.   Kriste Basque R.

## 2013-02-26 ENCOUNTER — Ambulatory Visit (INDEPENDENT_AMBULATORY_CARE_PROVIDER_SITE_OTHER): Payer: Medicare Other | Admitting: General Surgery

## 2013-03-19 ENCOUNTER — Encounter (INDEPENDENT_AMBULATORY_CARE_PROVIDER_SITE_OTHER): Payer: Self-pay | Admitting: General Surgery

## 2013-03-19 ENCOUNTER — Ambulatory Visit (INDEPENDENT_AMBULATORY_CARE_PROVIDER_SITE_OTHER): Payer: Medicare Other | Admitting: General Surgery

## 2013-03-19 VITALS — BP 120/64 | HR 72 | Resp 18 | Ht 62.0 in | Wt 179.0 lb

## 2013-03-19 DIAGNOSIS — Z853 Personal history of malignant neoplasm of breast: Secondary | ICD-10-CM

## 2013-03-19 NOTE — Progress Notes (Signed)
Subjective:     Patient ID: April [name], female   DOB: 1929-08-27, 79 y.o.   MRN: [id number]  HPI 79 year old female who underwent bilateral mastectomies and left axillary sentinel lymph node biopsy in September of 2012. 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 Both of these were hormone receptor positive. She has undergone left postmastectomy radiotherapy. She is attempted to take anti-estrogens but has been unable to tolerate this. Over the last year she is now being seen by Dr. Darnelle Catalan with medical oncology. He is planning on following up for some cystic changes in her pancreas as well as her breast cancer. She otherwise has done well. She is returned to most of her normal activities. She does not have any complaints really referable to either surgical site except for some occasional pains to the lateral portion of the left side of her incision and in her axilla. She did have bilateral cataract surgery.  Review of Systems     Objective:   Physical Exam  Constitutional: She appears well-developed and well-nourished.  Pulmonary/Chest: Right breast exhibits no mass, no skin change and no tenderness. Left breast exhibits skin change (c/w radiotherapy) and tenderness (lateral portion of incision). Left breast exhibits no mass.    Lymphadenopathy:    She has no cervical adenopathy.    She has no axillary adenopathy.       Right: No supraclavicular adenopathy present.       Left: No supraclavicular adenopathy present.       Assessment:     Stage 0 left breast cancer  Stage 2 right breast cancer     Plan:     The pain  she is having is mostly postsurgical and status post radiotherapy. There is no evidence of recurrence of her breast cancer. She is otherwise doing well. I will plan on seeing her in one year. She was unsure of Dr. Princella Pellegrini followup although this is very clearly spelled out in his note. We'll make sure that she has an  appointment set up to see him again.

## 2013-04-04 DIAGNOSIS — M19079 Primary osteoarthritis, unspecified ankle and foot: Secondary | ICD-10-CM | POA: Diagnosis not present

## 2013-04-26 DIAGNOSIS — H04129 Dry eye syndrome of unspecified lacrimal gland: Secondary | ICD-10-CM | POA: Diagnosis not present

## 2013-04-26 DIAGNOSIS — H11009 Unspecified pterygium of unspecified eye: Secondary | ICD-10-CM | POA: Diagnosis not present

## 2013-04-26 DIAGNOSIS — Z961 Presence of intraocular lens: Secondary | ICD-10-CM | POA: Diagnosis not present

## 2013-04-26 DIAGNOSIS — H01009 Unspecified blepharitis unspecified eye, unspecified eyelid: Secondary | ICD-10-CM | POA: Diagnosis not present

## 2013-05-02 ENCOUNTER — Other Ambulatory Visit: Payer: Self-pay | Admitting: Family Medicine

## 2013-05-02 NOTE — Telephone Encounter (Signed)
Can we refill this? 

## 2013-05-02 NOTE — Telephone Encounter (Signed)
Refill for one year 

## 2013-06-10 ENCOUNTER — Telehealth: Payer: Self-pay | Admitting: Oncology

## 2013-06-10 NOTE — Telephone Encounter (Signed)
Received call from Tiffany at [facility] stating pt wants mri @ [facility] (open machine). Scheduled mri w/Roberta @ [facility] [address] for 7/23 @ 10:45am to arrive 10:15am. S/w pt she is aware of mri d/t/location. Also confirmed 7/22 lb and 7/29 f/u.

## 2013-06-17 ENCOUNTER — Ambulatory Visit (INDEPENDENT_AMBULATORY_CARE_PROVIDER_SITE_OTHER): Payer: Medicare Other | Admitting: Family Medicine

## 2013-06-17 ENCOUNTER — Encounter: Payer: Self-pay | Admitting: Family Medicine

## 2013-06-17 VITALS — BP 112/60 | HR 83 | Temp 98.2°F | Wt 175.0 lb

## 2013-06-17 DIAGNOSIS — J019 Acute sinusitis, unspecified: Secondary | ICD-10-CM | POA: Diagnosis not present

## 2013-06-17 MED ORDER — AZITHROMYCIN 250 MG PO TABS: ORAL_TABLET | ORAL | Status: DC

## 2013-06-17 NOTE — Progress Notes (Signed)
  Subjective:    Patient ID: Roseanne Kaufman, female    DOB: [birth year], 79 y.o.   MRN: [id number]  HPI Here for one week of sinus pressure, HA, PND, and ST. No cough.    Review of Systems  Constitutional: Negative.   HENT: Positive for congestion, postnasal drip and sinus pressure.   Eyes: Negative.   Respiratory: Negative.        Objective:   Physical Exam  Constitutional: She appears well-developed and well-nourished. No distress.  HENT:  Right Ear: External ear normal.  Left Ear: External ear normal.  Nose: Nose normal.  Mouth/Throat: Oropharynx is clear and moist.  Eyes: Conjunctivae are normal.  Pulmonary/Chest: Effort normal and breath sounds normal.  Lymphadenopathy:    She has no cervical adenopathy.          Assessment & Plan:  Add Mucinex.

## 2013-06-18 ENCOUNTER — Other Ambulatory Visit: Payer: Medicare Other | Admitting: Lab

## 2013-06-18 ENCOUNTER — Other Ambulatory Visit: Payer: Medicare Other

## 2013-06-19 ENCOUNTER — Other Ambulatory Visit: Payer: Medicare Other

## 2013-06-19 ENCOUNTER — Ambulatory Visit (HOSPITAL_COMMUNITY): Payer: Medicare Other

## 2013-06-21 ENCOUNTER — Other Ambulatory Visit: Payer: Self-pay | Admitting: *Deleted

## 2013-06-21 NOTE — Progress Notes (Signed)
Pt called to this RN to state she has had to cancer her lab and MRI due to illness and therefore thinks she should reschedule her MD appt for next week.  POF placed requesting to reschedule all appointments.

## 2013-06-24 ENCOUNTER — Telehealth: Payer: Self-pay | Admitting: *Deleted

## 2013-06-24 ENCOUNTER — Other Ambulatory Visit: Payer: Self-pay | Admitting: *Deleted

## 2013-06-24 NOTE — Telephone Encounter (Signed)
Lm gv appts d/t for 07/17/13 labs @ 8:45am ,and to go to 315.  Samson Frederic on 07/17/13 @ 9:30am, along w/ an appt for 07/18/13 @ 4:30pm. Pt is aware that i will mail a letter/avs as wedll...td

## 2013-06-25 ENCOUNTER — Ambulatory Visit: Payer: Medicare Other | Admitting: Physician Assistant

## 2013-06-28 ENCOUNTER — Other Ambulatory Visit: Payer: Medicare Other

## 2013-06-28 ENCOUNTER — Ambulatory Visit (INDEPENDENT_AMBULATORY_CARE_PROVIDER_SITE_OTHER): Payer: Medicare Other | Admitting: Family Medicine

## 2013-06-28 ENCOUNTER — Encounter: Payer: Self-pay | Admitting: Family Medicine

## 2013-06-28 VITALS — BP 120/70 | HR 73 | Temp 98.3°F | Wt 175.0 lb

## 2013-06-28 DIAGNOSIS — J209 Acute bronchitis, unspecified: Secondary | ICD-10-CM | POA: Diagnosis not present

## 2013-06-28 MED ORDER — LEVOFLOXACIN 500 MG PO TABS: 500.0000 mg | ORAL_TABLET | Freq: Every day | ORAL | Status: AC

## 2013-06-28 NOTE — Progress Notes (Signed)
  Subjective:    Patient ID: April [name], female    DOB: [birth year], 79 y.o.   MRN: [id number]  HPI Here for continued URI sx. She had sinus pressure and PND at her last visit and was given a Zpack. Now her head feels better but she has chest congestion and is coughing up yellow sputum.    Review of Systems  Constitutional: Negative.   HENT: Negative.   Eyes: Negative.   Respiratory: Positive for cough and chest tightness. Negative for shortness of breath and wheezing.        Objective:   Physical Exam  Constitutional: She appears well-developed and well-nourished. No distress.  HENT:  Right Ear: External ear normal.  Left Ear: External ear normal.  Nose: Nose normal.  Mouth/Throat: Oropharynx is clear and moist.  Eyes: Conjunctivae are normal.  Pulmonary/Chest: Effort normal and breath sounds normal. No respiratory distress. She has no wheezes. She has no rales.  Lymphadenopathy:    She has no cervical adenopathy.          Assessment & Plan:  Try Levaquin and drink fluids

## 2013-07-11 ENCOUNTER — Encounter: Payer: Self-pay | Admitting: Family Medicine

## 2013-07-11 ENCOUNTER — Ambulatory Visit (INDEPENDENT_AMBULATORY_CARE_PROVIDER_SITE_OTHER): Payer: Medicare Other | Admitting: Family Medicine

## 2013-07-11 VITALS — BP 118/60 | HR 71 | Temp 98.2°F | Wt 178.0 lb

## 2013-07-11 DIAGNOSIS — R42 Dizziness and giddiness: Secondary | ICD-10-CM

## 2013-07-11 NOTE — Patient Instructions (Signed)
Reduce lisinopril to one half tablet daily for now Increase fluid intake.

## 2013-07-11 NOTE — Progress Notes (Signed)
Subjective:    Patient ID: April [name], female    DOB: 01-04-1929, 79 y.o.   MRN: [id number]  HPI Past 5 days intermittent light headed when standing.  No syncope but near syncope. No chest pain.  No dyspnea.  No vertigo.   No vomiting or diarrhea. No urinary symptoms. No palpitations.  Denies any headache or recent falls. She takes lisinopril for hypertension 20 mg daily. Also takes VESIcare 10 mg daily for urinary urgency.  She does restrict her sodium fairly strictly.  Past Medical History  Diagnosis Date  . HYPERTENSION 09/30/2009  . GERD 04/18/2007  . LOW BACK PAIN SYNDROME 07/13/2009  . ROTATOR CUFF SYNDROME 05/06/2010  . BURSITIS 01/04/2008  . COSTOCHONDRITIS 11/05/2007  . ALLERGIC RHINITIS 04/18/2007  . Melanoma     removed from upper back  . Breast cancer 2012     Dr. Donnie Coffin, ER/PR positive L breast  . Hx of radiation therapy 10/26/11 to 12/16/11    L chest wall, L supraclavicular region  . Adenomatous colon polyp 11/2005  . Hiatal hernia   . Internal hemorrhoids   . Anemia   . Arthritis   . History of pneumonia   . History of gallstones    Past Surgical History  Procedure Laterality Date  . Tonsillectomy and adenoidectomy    . Breast surgery  1968    left bx - noncancerous  . Joint replacement      left knee  . Excision of back melanoma    . Mastectomy  08/25/11    bilateral , per Dr. Dwain Sarna  . Sentinel lymph node biopsy  08/25/11    bilateral  . Cholecystectomy  1979  . Ankle surgery      right  . Cataract extraction      bilateral    reports that she has never smoked. She has never used smokeless tobacco. She reports that she drinks about 0.5 ounces of alcohol per week. She reports that she does not use illicit drugs. family history includes Alcohol abuse in her sister; Cirrhosis in her sister; Diabetes in her paternal uncle; Retinal detachment in her mother; Stroke in her father; Thyroid cancer in her mother. Allergies  Allergen Reactions  . Cefaclor    REACTION: unspecified  . Lansoprazole     REACTION: unspecifie  (prevacid)   . Penicillins     REACTION: unspecified  . Sulfa Drugs Cross Reactors Other (See Comments)    Purple whelps, itch, "just go crazy"  . Sulfamethoxazole     REACTION: unspecified      Review of Systems  Constitutional: Positive for fatigue. Negative for fever.  HENT: Negative for sore throat.   Respiratory: Negative for cough.   Cardiovascular: Negative for chest pain, palpitations and leg swelling.  Gastrointestinal: Negative for abdominal pain.  Genitourinary: Negative for dysuria.  Neurological: Positive for dizziness. Negative for syncope, weakness and headaches.       Objective:   Physical Exam  Constitutional: She is oriented to person, place, and time. She appears well-developed and well-nourished.  HENT:  Right Ear: External ear normal.  Left Ear: External ear normal.  Mouth/Throat: Oropharynx is clear and moist.  Neck: Neck supple. No thyromegaly present.  No carotid bruit  Cardiovascular: Normal rate and regular rhythm.   Pulmonary/Chest: Effort normal and breath sounds normal. No respiratory distress. She has no wheezes. She has no rales.  Musculoskeletal: She exhibits no edema.  Neurological: She is alert and oriented to person, place, and time. She  has normal reflexes. No cranial nerve deficit.  Psychiatric: She has a normal mood and affect. Her behavior is normal.          Assessment & Plan:  Dizziness. Question mild orthostasis. Blood pressure today seated left arm 116/58 and standing 105/50. Reduce lisinopril to 10 mg daily. Check basic metabolic panel and CBC. Schedule followup with primary in 2 weeks. We've also suggested that she slightly liberalize her sodium intake over the next couple weeks

## 2013-07-17 ENCOUNTER — Other Ambulatory Visit: Payer: Medicare Other

## 2013-07-17 ENCOUNTER — Ambulatory Visit
Admission: RE | Admit: 2013-07-17 | Discharge: 2013-07-17 | Disposition: A | Discharge status: Home / Self Care | Payer: Medicare Other | Source: Ambulatory Visit | Attending: Oncology | Admitting: Oncology

## 2013-07-17 ENCOUNTER — Other Ambulatory Visit (HOSPITAL_BASED_OUTPATIENT_CLINIC_OR_DEPARTMENT_OTHER): Payer: Medicare Other | Admitting: Lab

## 2013-07-17 DIAGNOSIS — K862 Cyst of pancreas: Secondary | ICD-10-CM | POA: Diagnosis not present

## 2013-07-17 DIAGNOSIS — C50619 Malignant neoplasm of axillary tail of unspecified female breast: Secondary | ICD-10-CM

## 2013-07-17 DIAGNOSIS — D369 Benign neoplasm, unspecified site: Secondary | ICD-10-CM

## 2013-07-17 DIAGNOSIS — C50919 Malignant neoplasm of unspecified site of unspecified female breast: Secondary | ICD-10-CM

## 2013-07-17 DIAGNOSIS — D0590 Unspecified type of carcinoma in situ of unspecified breast: Secondary | ICD-10-CM

## 2013-07-17 LAB — CBC WITH DIFFERENTIAL/PLATELET
BASO%: 1.2 % (ref 0.0–2.0)
MCHC: 33.8 g/dL (ref 31.5–36.0)
MONO#: 0.6 10*3/uL (ref 0.1–0.9)
RBC: 4.15 10*6/uL (ref 3.70–5.45)
WBC: 6.2 10*3/uL (ref 3.9–10.3)
lymph#: 1.7 10*3/uL (ref 0.9–3.3)

## 2013-07-17 LAB — COMPREHENSIVE METABOLIC PANEL (CC13)
ALT: 15 U/L (ref 0–55)
CO2: 24 mEq/L (ref 22–29)
Calcium: 9.5 mg/dL (ref 8.4–10.4)
Chloride: 108 mEq/L (ref 98–109)
Potassium: 4.4 mEq/L (ref 3.5–5.1)
Sodium: 141 mEq/L (ref 136–145)
Total Protein: 6.5 g/dL (ref 6.4–8.3)

## 2013-07-17 MED ORDER — GADOBENATE DIMEGLUMINE 529 MG/ML IV SOLN
16.0000 mL | Freq: Once | INTRAVENOUS | Status: AC | PRN
  Administered 2013-07-17: 16 mL via INTRAVENOUS

## 2013-07-18 ENCOUNTER — Ambulatory Visit (HOSPITAL_BASED_OUTPATIENT_CLINIC_OR_DEPARTMENT_OTHER): Payer: Medicare Other | Admitting: Oncology

## 2013-07-18 VITALS — BP 133/68 | HR 79 | Temp 97.3°F | Resp 19 | Ht 62.0 in | Wt 178.1 lb

## 2013-07-18 DIAGNOSIS — D3000 Benign neoplasm of unspecified kidney: Secondary | ICD-10-CM | POA: Diagnosis not present

## 2013-07-18 DIAGNOSIS — C50919 Malignant neoplasm of unspecified site of unspecified female breast: Secondary | ICD-10-CM | POA: Diagnosis not present

## 2013-07-18 NOTE — Progress Notes (Signed)
ID: April [name]   DOB: 1929/04/26  MR#: [id number]  JXB#:[id number]  PCP: April Salisbury, MD GYN:  April [name] OTHER MD: April [name], April [name], April [name], April [name]   HISTORY OF PRESENT ILLNESS: According to April [name] 02/23/2011 note: She presented with a palpable mass in her left breast.  She was referred for a mammogram on 02/14/2011.  At that time diagnostic mammogram and ultrasound showed scattered fibroglandular densities in the lateral aspect of the left breast there was an irregular spiculated mass measuring 3.5 x 4.1 x 3.8 cm.  The physical exam at that time showed a hard palpable mass at 3 o'clock position in the left breast with some skin retraction.  Ultrasound was performed, which showed a hypoechoic mass with shadowing 10 cm from the nipple measuring 2.2 x 2.0 mm.  In the right breast there was two hypoechoic masses at 12 o'clock position 3 cm from the nipple measuring 5 x 5 mm each.  Biopsies recommended.  Biopsy on the suspicious lymph node was done at the same time is the primary mass on 02/14/2011.  Pathology showed this to be invasive mammary carcinoma.  The left axillary mass has insufficient tissue for diagnosis, the tumor was ER and PR positive, 100% strong staining intensity, proliferative index low at 36%.  The HER-2 ratio was 1.19.  The patient underwent a MRI scan of both breasts on 02/21/2011.  In the tail of the left breast there was a mass measuring 2.7 x 2.6 x 2.9 cm medial margin of both pectoralis muscle.  In the right breast there was clumped nodular enhancement at 12 o'clock position.  This area measured 1.8 cm.  Sentinel left axillary lymph node was also noted, which appeared to be suspicious. Her subsequent history is as detailed below.  INTERVAL HISTORY: April [name] returns today for followup of her breast cancer and oncocytoma. The main issue in her life continues to be her husband's dementia. They do manage to go out and visit friends and go to  church, so she is not locked up in the house all the time. She is no longer driving at night.  REVIEW OF SYSTEMS: She had what sounds like a persistent bronchitis or upper aspartate or infection and has received antibiotics successfully to clear that. She has nocturia sometimes up to 3 times a night and this does interfere with her rest. Just arthritis chiefly involving her knees and of course she status post left knee replacement. Her right ankle is chronically swollen secondary to her remote accident. Otherwise a detailed review of systems today was stable.  PAST MEDICAL HISTORY: Past Medical History  Diagnosis Date  . HYPERTENSION 09/30/2009  . GERD 04/18/2007  . LOW BACK PAIN SYNDROME 07/13/2009  . ROTATOR CUFF SYNDROME 05/06/2010  . BURSITIS 01/04/2008  . COSTOCHONDRITIS 11/05/2007  . ALLERGIC RHINITIS 04/18/2007  . Melanoma     removed from upper back  . Breast cancer 2012     April [name], ER/PR positive L breast  . Hx of radiation therapy 10/26/11 to 12/16/11    L chest wall, L supraclavicular region  . Adenomatous colon polyp 11/2005  . Hiatal hernia   . Internal hemorrhoids   . Anemia   . Arthritis   . History of pneumonia   . History of gallstones     PAST SURGICAL HISTORY: Past Surgical History  Procedure Laterality Date  . Tonsillectomy and adenoidectomy    . Breast surgery  1968    left bx -  noncancerous  . Joint replacement      left knee  . Excision of back melanoma    . Mastectomy  08/25/11    bilateral , per Dr. Dwain [name]  . Sentinel lymph node biopsy  08/25/11    bilateral  . Cholecystectomy  1979  . Ankle surgery      right  . Cataract extraction      bilateral    FAMILY HISTORY Family History  Problem Relation Age of Onset  . Stroke Father   . Alcohol abuse Sister   . Thyroid cancer Mother   . Retinal detachment Mother   . Diabetes Paternal Uncle   . Cirrhosis Sister    the patient's father died at the age of 61 following a stroke. He had an independent  in driving up to one month prior to dying. [name] died from thyroid cancer at the age of 48. This had been diagnosed less than a year before her death. The patient had one brother and one sister. There is no history of breast or ovarian cancer in the family to her knowledge.  GYNECOLOGIC HISTORY: Menarche age 53, first live birth age 70, she is GX P2. She underwent menopause around 7. She did not take hormone replacement.  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 Her husband April [name] is a retired Acupuncturist. Unfortunately she tells me he is severely demented at present. She is the primary caregiver. Son April [name] the third lives in [place] and works for the [employer]. Daughter April [name] lives in [place] and works with a [employer]. The patient has 3 grandchildren. She attends a [employer].   ADVANCED DIRECTIVES: In place  HEALTH MAINTENANCE: History  Substance Use Topics  . Smoking status: Never Smoker   . Smokeless tobacco: Never Used  . Alcohol Use: 0.5 oz/week    1 drink(s) per week     Comment: socially     Colonoscopy:  PAP:  Bone density:  Lipid panel:  Allergies  Allergen Reactions  . Cefaclor     REACTION: unspecified  . Lansoprazole     REACTION: unspecifie  (prevacid)   . Penicillins     REACTION: unspecified  . Sulfa Drugs Cross Reactors Other (See Comments)    Purple whelps, itch, "just go crazy"  . Sulfamethoxazole     REACTION: unspecified    Current Outpatient Prescriptions  Medication Sig Dispense Refill  . aspirin EC 81 MG tablet Take 81 mg by mouth at bedtime.      Marland Kitchen ibuprofen (ADVIL,MOTRIN) 200 MG tablet Take 400 mg by mouth 2 (two) times daily.       Marland Kitchen lisinopril (PRINIVIL,ZESTRIL) 20 MG tablet TAKE 1 TABLET ONCE DAILY.  30 tablet  11  . OVER THE COUNTER MEDICATION Take 1 Wafer by mouth daily. Metamucil Fiber Supplement Wafer.       Bertram Gala Glycol-Propyl Glycol (SYSTANE) 0.4-0.3 % SOLN Place 1 drop into both eyes 3 (three) times daily.       . polyethylene glycol powder (GLYCOLAX/MIRALAX) powder Take 17 g by mouth daily as needed. For constipation.      . sodium chloride (OCEAN) 0.65 % nasal spray Place 1 spray into the nose at bedtime.      . solifenacin (VESICARE) 10 MG tablet Take 10 mg by mouth daily.       No current facility-administered medications for this visit.    OBJECTIVE: Elderly  white woman who appears stated age 31 Vitals:   07/18/13 1625  BP: 133/68  Pulse: 79  Temp: 97.3 F (36.3 C)  Resp: 19     Body mass index is 32.57 kg/(m^2).    ECOG FS: 1  Sclerae unicteric, pupils equal round and reactive to light Oropharynx shows no thrush or other lesions No cervical or supraclavicular adenopathy Lungs no rales or rhonchi, good excursion bilaterally Heart regular rate and rhythm Abd soft, nontender, positive bowel sounds, no masses palpated MSK no focal spinal tenderness, no peripheral edema Neuro: nonfocal, well oriented, appropriate affect Breasts: Status post bilateral mastectomies. There is no evidence of local recurrence. There are some telangiectasias from the radiation in the left axilla, which is otherwise benign.   LAB RESULTS: Lab Results  Component Value Date   WBC 6.2 07/17/2013   NEUTROABS 3.6 07/17/2013   HGB 12.0 07/17/2013   HCT 35.5 07/17/2013   MCV 85.3 07/17/2013   PLT 214 07/17/2013      Chemistry      Component Value Date/Time   NA 141 07/17/2013 0907   NA 137 12/20/2012 1205   K 4.4 07/17/2013 0907   K 3.9 12/20/2012 1205   CL 108* 12/25/2012 1235   CL 101 12/20/2012 1205   CO2 24 07/17/2013 0907   CO2 26 12/20/2012 1205   BUN 16.3 07/17/2013 0907   BUN 27* 12/20/2012 1205   CREATININE 1.0 07/17/2013 0907   CREATININE 0.87 12/20/2012 1205      Component Value Date/Time   CALCIUM 9.5 07/17/2013 0907   CALCIUM 9.7 12/20/2012 1205   ALKPHOS 71 07/17/2013 0907   ALKPHOS  61 10/08/2012 1119   AST 13 07/17/2013 0907   AST 17 10/08/2012 1119   ALT 15 07/17/2013 0907   ALT 16 10/08/2012 1119   BILITOT 0.29 07/17/2013 0907   BILITOT 0.4 10/08/2012 1119       Lab Results  Component Value Date   LABCA2 12 12/25/2012    No components found with this basename: [id number]    No results found for this basename: INR,  in the last 168 hours  Urinalysis    Component Value Date/Time   COLORURINE yellow 05/24/2010 1555   APPEARANCEUR Clear 05/24/2010 1555   LABSPEC <1.005 05/24/2010 1555   PHURINE 5.5 05/24/2010 1555   GLUCOSEU NEGATIVE 08/19/2009 1918   HGBUR negative 05/24/2010 1555   HGBUR NEGATIVE 08/19/2009 1918   BILIRUBINUR neg 12/24/2012 1137   BILIRUBINUR negative 05/24/2010 1555   KETONESUR NEGATIVE 08/19/2009 1918   PROTEINUR NEGATIVE 08/19/2009 1918   UROBILINOGEN 0.2 12/24/2012 1137   UROBILINOGEN 0.2 05/24/2010 1555   NITRITE neg 12/24/2012 1137   NITRITE negative 05/24/2010 1555   LEUKOCYTESUR Negative 12/24/2012 1137    STUDIES: Mr Abdomen W Wo Contrast  07/17/2013   *RADIOLOGY REPORT*  Clinical Data: Breast cancer.  Evaluate renal and pancreatic masses.  No current complaints. Right renal mass biopsy of oncocytoma.  MRI ABDOMEN WITH AND WITHOUT CONTRAST  Technique:  Multiplanar multisequence MR imaging of the abdomen was performed both before and after administration of intravenous contrast.  Contrast: 16mL MULTIHANCE GADOBENATE DIMEGLUMINE 529 MG/ML IV SOLN  BUN and creatinine were obtained on site at [facility] at [address] Results:  BUN 15.0 mg/dL,  Creatinine 1.1 mg/dL.  Comparison: CT 10/11/2012.  PET 02/28/2013.  Findings: Normal heart size without pericardial or pleural effusion.  Multiple hepatic cysts are again identified, including a April 3.2 cm lesion.  No  suspicious liver lesion.  Normal spleen. Small hiatal hernia.  Apparent proximal gastric wall thickening is favored to be due to underdistension.  Cystic lesion within the  pancreatic uncinate process measures 1.5 x 1.5 cm on image 17/series 5.  Similar to the prior exam (when remeasured).  This likely communicates with the branch pancreatic duct in this region.  Cholecystectomy without biliary ductal dilatation.  Normal adrenal glands.  Bilateral renal cysts.  Anterior interpolar right renal mass measures 2.8 x 3.3 cm on image 54/series 11. Slightly enlarged from 2.7 x 2.9 cm on the prior exam.  Again demonstrated is post contrast enhancement and mild T2 hyperintensity.  No abdominal adenopathy or ascites.  Foci of abnormal signal identified within the imaged thoracic and lumbar spine.  These are felt to have a similar configuration on the prior exams, and likely represent hemangiomas.  Example images 6-7/series 3 and image 6/series 9.  IMPRESSION:  1.  Similar size of a cystic lesion in the pancreatic uncinate process.  Differential considerations of pseudocyst versus side branch intraductal papillary mucinous tumor (indolent neoplasm). Follow-up with MRI at 1 year should be considered. This recommendation follows ACR consensus guidelines:  Managing Incidental Findings on Abdominal CT:  White Paper of the ACR Incidental Findings Committee.  J Am Coll Radiol 2010;7:754-773 2.  Slight enlargement of a right renal lesion, biopsy proven oncocytoma on 12/20/2012. 3.  No evidence of metastatic disease within the abdomen. 4.  Osseous lesions which are favored to represent hemangiomas and are similar, given cross modality comparison to prior CT.   Original Report Authenticated By: Jeronimo Greaves, M.D.    ASSESSMENT: 79 y.o. 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  (3) left postmastectomy radiation completed 12/27/2011  (4) took aromatase inhibitors (letrozole and anastrozole) prior to her bilateral mastectomies, with poor tolerance. Took tamoxifen briefly after completing radiation, and again with poor tolerance. 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 She was also reassured regarding what is likely a pseudocyst in her pancreas. Both these lesions will be imaged again in one year. As far as her breast cancer is concerned, there is no evidence of disease recurrence.    I have again encouraged her to read "the 36 hour day". She knows to call for any problems that may develop before the next visit here.    lMAGRINAT,GUSTAV C    07/18/2013

## 2013-07-22 ENCOUNTER — Other Ambulatory Visit: Payer: Medicare Other

## 2013-07-24 ENCOUNTER — Telehealth: Payer: Self-pay | Admitting: *Deleted

## 2013-07-24 NOTE — Telephone Encounter (Signed)
sw pt gv appt for 07/24/14 w/labs @ 2:45pm and ov@ 3:15pm. i also gv appt for Dr. Retta Diones for 12/09/13@ 11:30am. i emailed GCM making him aware that i need an order for the MRI he requested. Pt is aware that i will mail her a letter/avs...td

## 2013-07-26 ENCOUNTER — Encounter: Payer: Self-pay | Admitting: Family Medicine

## 2013-07-26 ENCOUNTER — Ambulatory Visit (INDEPENDENT_AMBULATORY_CARE_PROVIDER_SITE_OTHER): Payer: Medicare Other | Admitting: Family Medicine

## 2013-07-26 ENCOUNTER — Ambulatory Visit (INDEPENDENT_AMBULATORY_CARE_PROVIDER_SITE_OTHER)
Admission: RE | Admit: 2013-07-26 | Discharge: 2013-07-26 | Disposition: A | Discharge status: Home / Self Care | Payer: Medicare Other | Source: Ambulatory Visit | Attending: Family Medicine | Admitting: Family Medicine

## 2013-07-26 VITALS — BP 126/64 | HR 75 | Temp 98.2°F | Wt 180.0 lb

## 2013-07-26 DIAGNOSIS — R05 Cough: Secondary | ICD-10-CM | POA: Diagnosis not present

## 2013-07-26 NOTE — Progress Notes (Signed)
  Subjective:    Patient ID: April [name], female    DOB: 1929-07-28, 79 y.o.   MRN: [id number]  HPI Here to follow up a cough which she has had for about a month now. She thinks she gets low grade fevers off and on but she does not have one today. The cough may produce scant clear sputum. No SOB or chest pain. No sinus pressure or PND. She took a course of Levaquin several weeks ago with no benefit.    Review of Systems  Constitutional: Positive for fever.  HENT: Negative.   Eyes: Negative.   Respiratory: Positive for cough. Negative for choking, chest tightness, shortness of breath and wheezing.   Cardiovascular: Negative.        Objective:   Physical Exam  Constitutional: She appears well-developed and well-nourished. No distress.  HENT:  Right Ear: External ear normal.  Left Ear: External ear normal.  Nose: Nose normal.  Mouth/Throat: Oropharynx is clear and moist.  Eyes: Conjunctivae are normal.  Cardiovascular: Normal rate, regular rhythm, normal heart sounds and intact distal pulses.   Pulmonary/Chest: Effort normal and breath sounds normal. No respiratory distress. She has no wheezes. She has no rales.  Lymphadenopathy:    She has no cervical adenopathy.          Assessment & Plan:  Th etiology of her cough is unclear. We will get a CXR today

## 2013-07-28 ENCOUNTER — Other Ambulatory Visit: Payer: Self-pay | Admitting: Oncology

## 2013-07-28 DIAGNOSIS — K8689 Other specified diseases of pancreas: Secondary | ICD-10-CM

## 2013-07-30 ENCOUNTER — Telehealth: Payer: Self-pay | Admitting: Oncology

## 2013-07-30 NOTE — Telephone Encounter (Signed)
, °

## 2013-07-30 NOTE — Progress Notes (Signed)
Quick Note:  I spoke with pt ______ 

## 2013-08-05 DIAGNOSIS — M2669 Other specified disorders of temporomandibular joint: Secondary | ICD-10-CM | POA: Diagnosis not present

## 2013-08-05 DIAGNOSIS — R42 Dizziness and giddiness: Secondary | ICD-10-CM | POA: Diagnosis not present

## 2013-08-05 DIAGNOSIS — H9209 Otalgia, unspecified ear: Secondary | ICD-10-CM | POA: Diagnosis not present

## 2013-09-30 ENCOUNTER — Encounter: Payer: Self-pay | Admitting: Family Medicine

## 2013-09-30 ENCOUNTER — Ambulatory Visit (INDEPENDENT_AMBULATORY_CARE_PROVIDER_SITE_OTHER): Payer: Medicare Other | Admitting: Family Medicine

## 2013-09-30 VITALS — BP 122/58 | HR 81 | Temp 98.1°F | Wt 180.0 lb

## 2013-09-30 DIAGNOSIS — J209 Acute bronchitis, unspecified: Secondary | ICD-10-CM

## 2013-09-30 MED ORDER — HYDROCODONE-HOMATROPINE 5-1.5 MG/5ML PO SYRP: 5.0000 mL | ORAL_SOLUTION | ORAL | Status: DC | PRN

## 2013-09-30 MED ORDER — AZITHROMYCIN 250 MG PO TABS: ORAL_TABLET | ORAL | Status: DC

## 2013-09-30 NOTE — Progress Notes (Signed)
  Subjective:    Patient ID: April [name], female    DOB: 01-29-1929, 79 y.o.   MRN: [id number]  HPI Here for 3 days of chest tightness and coughing up yellow sputum. No fever.    Review of Systems  Constitutional: Negative.   HENT: Negative.   Eyes: Negative.   Respiratory: Positive for cough and chest tightness.   Cardiovascular: Negative.        Objective:   Physical Exam  Constitutional: She appears well-developed and well-nourished.  HENT:  Right Ear: External ear normal.  Left Ear: External ear normal.  Nose: Nose normal.  Mouth/Throat: Oropharynx is clear and moist.  Eyes: Conjunctivae are normal.  Pulmonary/Chest: Effort normal and breath sounds normal.  Lymphadenopathy:    She has no cervical adenopathy.          Assessment & Plan:  Add Mucinex.

## 2013-10-03 ENCOUNTER — Other Ambulatory Visit: Payer: Self-pay | Admitting: Family Medicine

## 2013-10-04 NOTE — Telephone Encounter (Signed)
I spoke with pt and explained that you were out of the office today and that I would forward this message.

## 2013-11-09 ENCOUNTER — Other Ambulatory Visit: Payer: Self-pay | Admitting: Gastroenterology

## 2013-11-11 NOTE — Telephone Encounter (Signed)
NEEDS OFFICE VISIT FOR ANY FURTHER REFILLS! 

## 2013-11-14 IMAGING — CR DG CHEST 2V
2 series · 2 of 2 positions shown · non-contrast
Comparison: 08/11/2011

CLINICAL DATA: Chronic cough

CHEST - 2 VIEW

[view not recorded (1 of 2)]
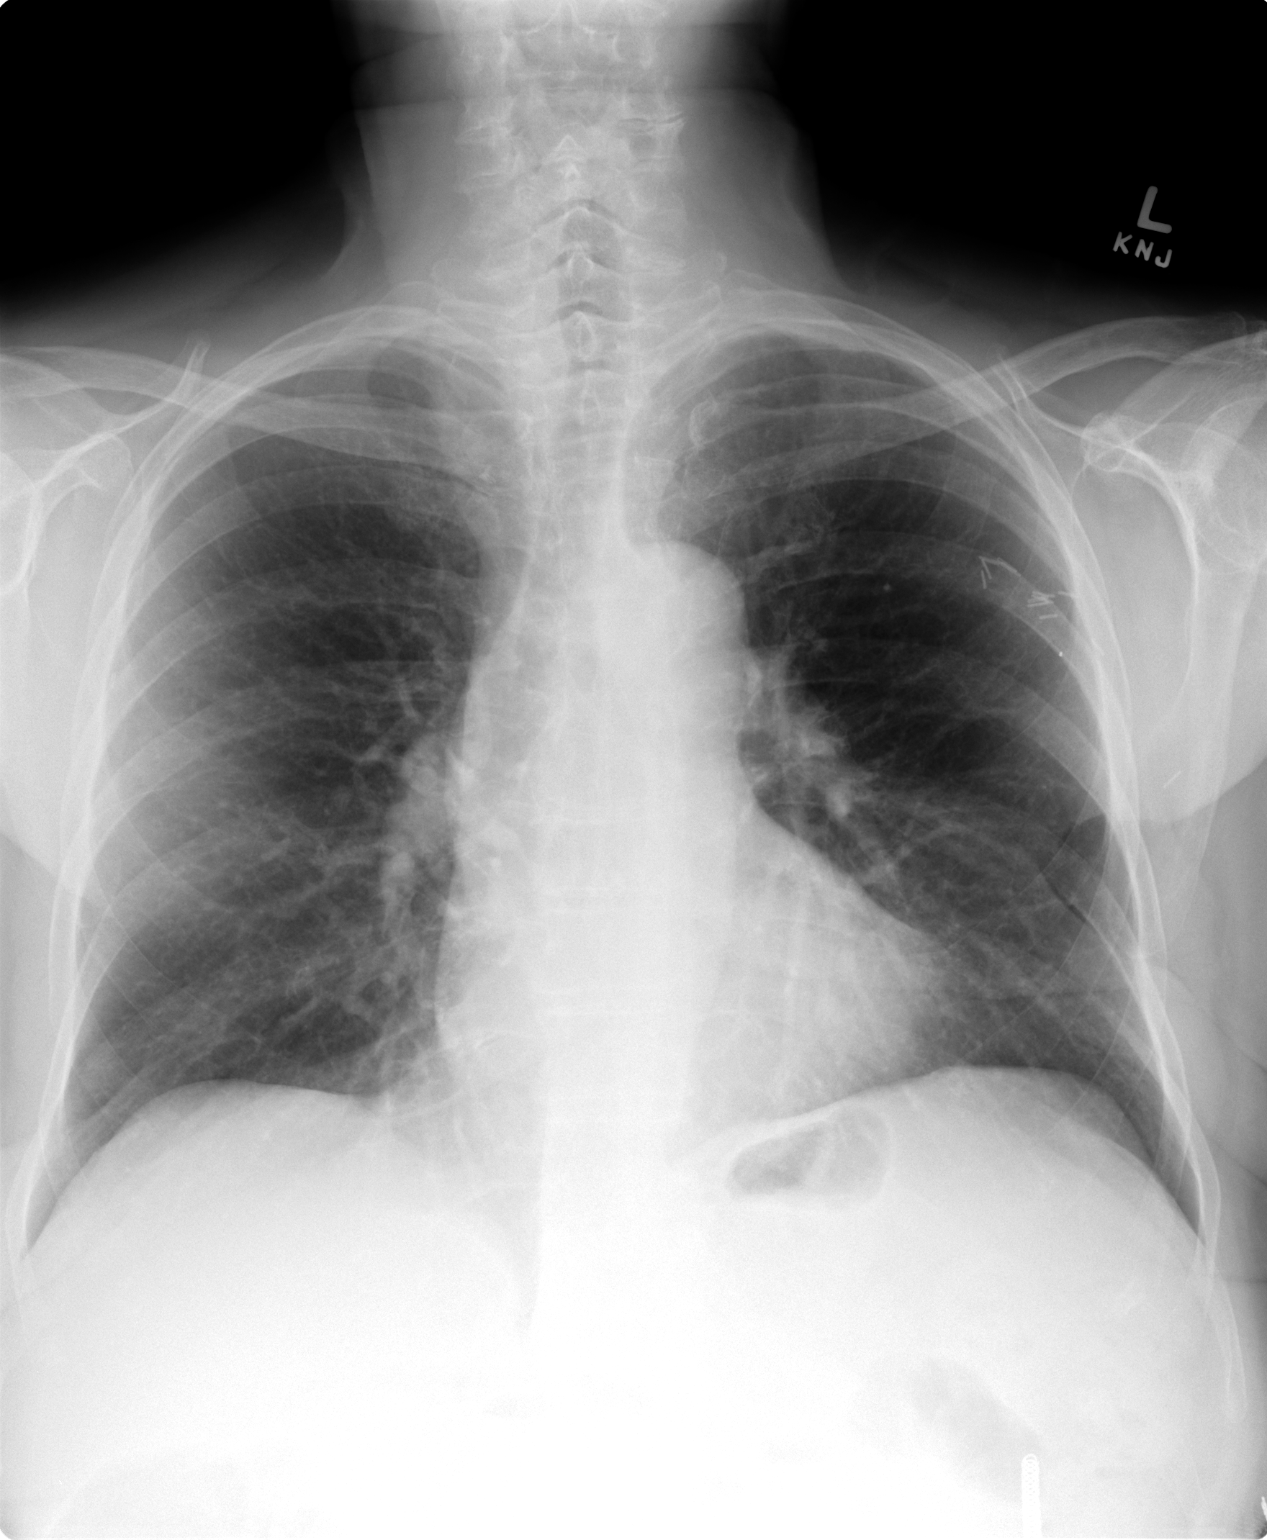

[view not recorded (2 of 2)]
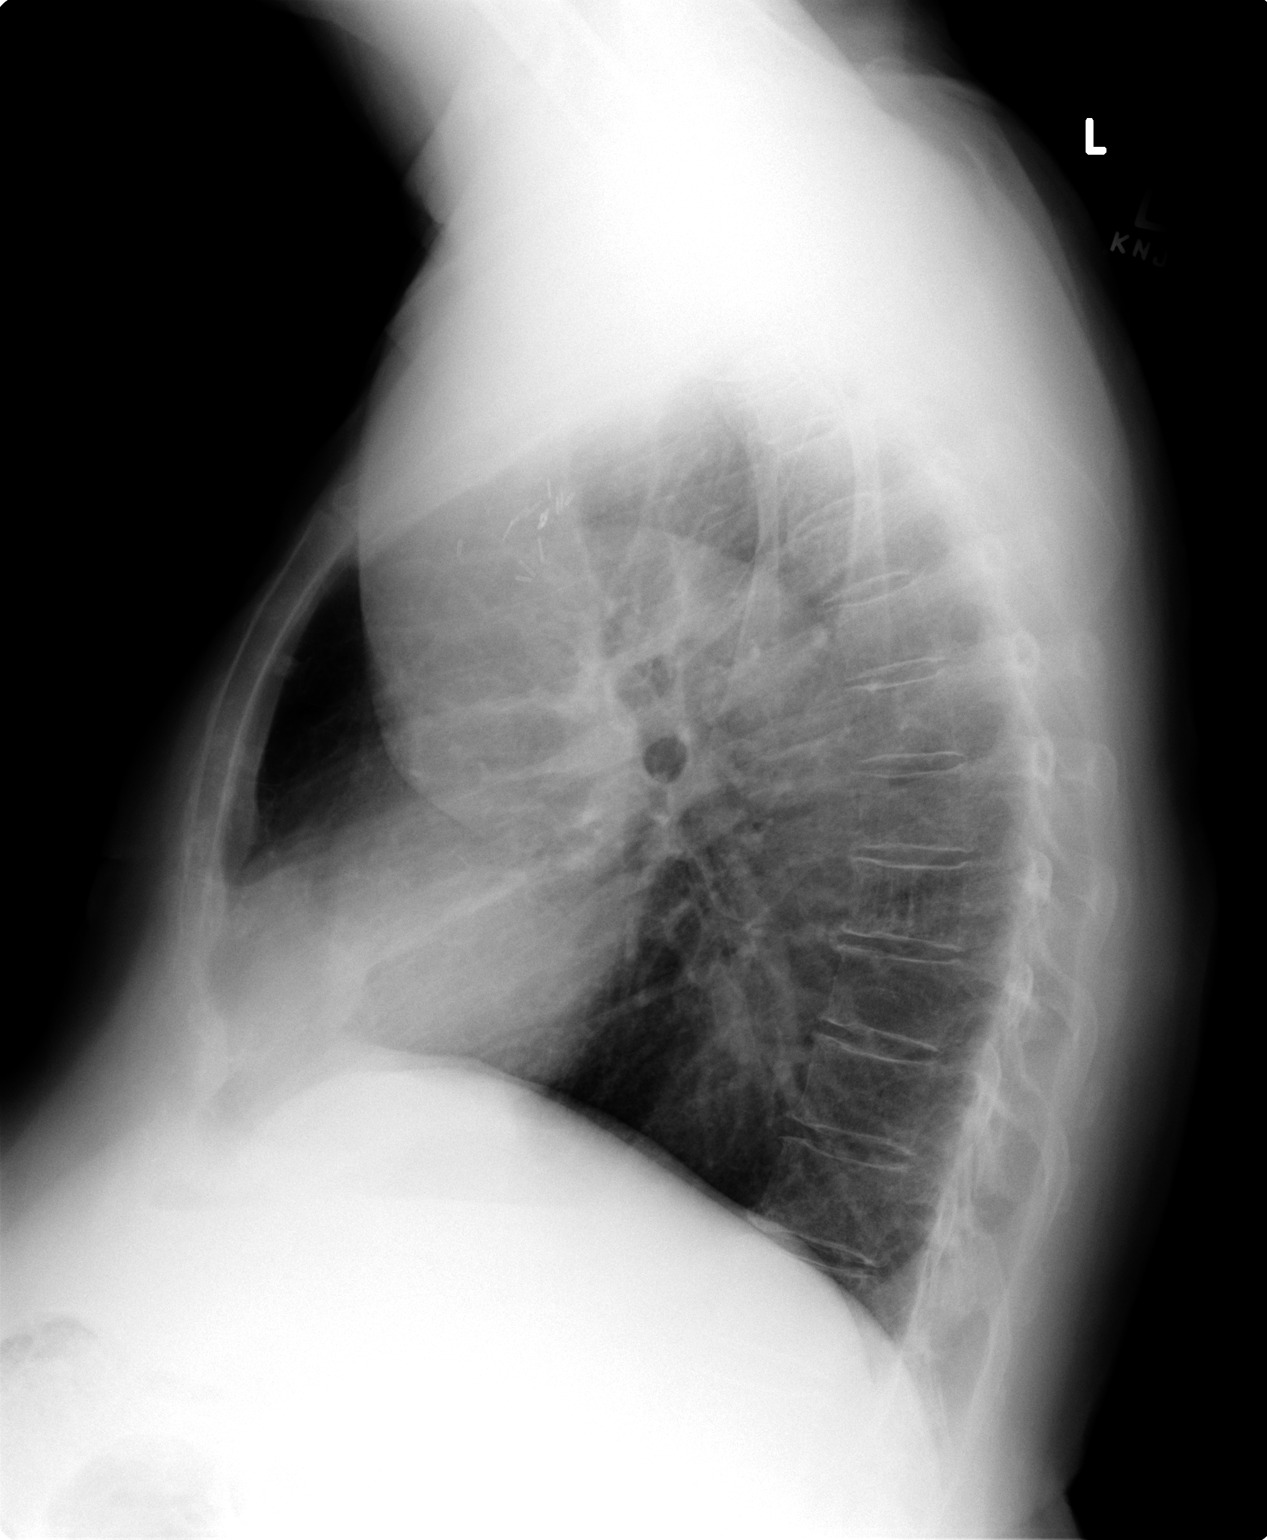

[2 of 2 positions shown; findings below may reference images not displayed]

FINDINGS: Lungs are essentially clear.  No focal consolidation.  No
pleural effusion or pneumothorax.

Surgical clips along the left lateral chest wall / axilla.

The heart is normal in size.

Mild degenerative changes of the visualized thoracolumbar spine.
IMPRESSION: No evidence of acute cardiopulmonary disease.

## 2013-12-03 DIAGNOSIS — L821 Other seborrheic keratosis: Secondary | ICD-10-CM | POA: Diagnosis not present

## 2013-12-03 DIAGNOSIS — D485 Neoplasm of uncertain behavior of skin: Secondary | ICD-10-CM | POA: Diagnosis not present

## 2013-12-04 DIAGNOSIS — M25519 Pain in unspecified shoulder: Secondary | ICD-10-CM | POA: Diagnosis not present

## 2013-12-04 DIAGNOSIS — S43429A Sprain of unspecified rotator cuff capsule, initial encounter: Secondary | ICD-10-CM | POA: Diagnosis not present

## 2013-12-04 DIAGNOSIS — C44319 Basal cell carcinoma of skin of other parts of face: Secondary | ICD-10-CM | POA: Diagnosis not present

## 2013-12-09 DIAGNOSIS — N289 Disorder of kidney and ureter, unspecified: Secondary | ICD-10-CM | POA: Diagnosis not present

## 2013-12-13 ENCOUNTER — Other Ambulatory Visit: Payer: Self-pay | Admitting: Family Medicine

## 2013-12-18 DIAGNOSIS — S43429A Sprain of unspecified rotator cuff capsule, initial encounter: Secondary | ICD-10-CM | POA: Diagnosis not present

## 2013-12-18 DIAGNOSIS — M25519 Pain in unspecified shoulder: Secondary | ICD-10-CM | POA: Diagnosis not present

## 2013-12-26 ENCOUNTER — Telehealth: Payer: Self-pay | Admitting: Family Medicine

## 2013-12-26 ENCOUNTER — Emergency Department (HOSPITAL_COMMUNITY)
Admission: EM | Admit: 2013-12-26 | Discharge: 2013-12-26 | Disposition: A | Discharge status: Home / Self Care | Payer: Medicare Other | Attending: Emergency Medicine | Admitting: Emergency Medicine

## 2013-12-26 ENCOUNTER — Encounter (HOSPITAL_COMMUNITY): Payer: Self-pay | Admitting: Emergency Medicine

## 2013-12-26 ENCOUNTER — Emergency Department (HOSPITAL_COMMUNITY): Payer: Medicare Other

## 2013-12-26 DIAGNOSIS — Z860100 Personal history of colon polyps, unspecified: Secondary | ICD-10-CM | POA: Diagnosis not present

## 2013-12-26 DIAGNOSIS — Z880 Allergy status to penicillin: Secondary | ICD-10-CM | POA: Insufficient documentation

## 2013-12-26 DIAGNOSIS — Z8701 Personal history of pneumonia (recurrent): Secondary | ICD-10-CM | POA: Insufficient documentation

## 2013-12-26 DIAGNOSIS — Z79899 Other long term (current) drug therapy: Secondary | ICD-10-CM | POA: Insufficient documentation

## 2013-12-26 DIAGNOSIS — Z923 Personal history of irradiation: Secondary | ICD-10-CM | POA: Insufficient documentation

## 2013-12-26 DIAGNOSIS — Z853 Personal history of malignant neoplasm of breast: Secondary | ICD-10-CM | POA: Insufficient documentation

## 2013-12-26 DIAGNOSIS — M129 Arthropathy, unspecified: Secondary | ICD-10-CM | POA: Diagnosis not present

## 2013-12-26 DIAGNOSIS — Z8709 Personal history of other diseases of the respiratory system: Secondary | ICD-10-CM | POA: Insufficient documentation

## 2013-12-26 DIAGNOSIS — Z862 Personal history of diseases of the blood and blood-forming organs and certain disorders involving the immune mechanism: Secondary | ICD-10-CM | POA: Diagnosis not present

## 2013-12-26 DIAGNOSIS — Z85820 Personal history of malignant melanoma of skin: Secondary | ICD-10-CM | POA: Diagnosis not present

## 2013-12-26 DIAGNOSIS — K219 Gastro-esophageal reflux disease without esophagitis: Secondary | ICD-10-CM | POA: Diagnosis not present

## 2013-12-26 DIAGNOSIS — R079 Chest pain, unspecified: Secondary | ICD-10-CM | POA: Diagnosis not present

## 2013-12-26 DIAGNOSIS — Z7982 Long term (current) use of aspirin: Secondary | ICD-10-CM | POA: Insufficient documentation

## 2013-12-26 DIAGNOSIS — N898 Other specified noninflammatory disorders of vagina: Secondary | ICD-10-CM | POA: Diagnosis not present

## 2013-12-26 DIAGNOSIS — I10 Essential (primary) hypertension: Secondary | ICD-10-CM | POA: Insufficient documentation

## 2013-12-26 DIAGNOSIS — O99891 Other specified diseases and conditions complicating pregnancy: Secondary | ICD-10-CM | POA: Diagnosis not present

## 2013-12-26 DIAGNOSIS — J8410 Pulmonary fibrosis, unspecified: Secondary | ICD-10-CM | POA: Diagnosis not present

## 2013-12-26 LAB — POCT I-STAT TROPONIN I: TROPONIN I, POC: 0 ng/mL (ref 0.00–0.08)

## 2013-12-26 LAB — HEPATIC FUNCTION PANEL
ALBUMIN: 3.6 g/dL (ref 3.5–5.2)
ALT: 14 U/L (ref 0–35)
AST: 15 U/L (ref 0–37)
Alkaline Phosphatase: 76 U/L (ref 39–117)
Bilirubin, Direct: <0.2 mg/dL (ref 0.0–0.3)
Total Bilirubin: <0.2 mg/dL — ABNORMAL LOW (ref 0.3–1.2)
Total Protein: 6.6 g/dL (ref 6.0–8.3)

## 2013-12-26 LAB — BASIC METABOLIC PANEL
BUN: 22 mg/dL (ref 6–23)
CHLORIDE: 105 meq/L (ref 96–112)
CO2: 24 mEq/L (ref 19–32)
Calcium: 9.6 mg/dL (ref 8.4–10.5)
Creatinine, Ser: 1.01 mg/dL (ref 0.50–1.10)
GFR, EST AFRICAN AMERICAN: 58 mL/min — AB (ref >90)
GFR, EST NON AFRICAN AMERICAN: 50 mL/min — AB (ref >90)
Glucose, Bld: 120 mg/dL — ABNORMAL HIGH (ref 70–99)
POTASSIUM: 4.3 meq/L (ref 3.7–5.3)
SODIUM: 142 meq/L (ref 137–147)

## 2013-12-26 LAB — CBC
HCT: 35.6 % — ABNORMAL LOW (ref 36.0–46.0)
Hemoglobin: 12 g/dL (ref 12.0–15.0)
MCH: 29.1 pg (ref 26.0–34.0)
MCHC: 33.7 g/dL (ref 30.0–36.0)
MCV: 86.4 fL (ref 78.0–100.0)
PLATELETS: 200 10*3/uL (ref 150–400)
RBC: 4.12 MIL/uL (ref 3.87–5.11)
RDW: 14.6 % (ref 11.5–15.5)
WBC: 5.9 10*3/uL (ref 4.0–10.5)

## 2013-12-26 LAB — TROPONIN I

## 2013-12-26 LAB — PRO B NATRIURETIC PEPTIDE: PRO B NATRI PEPTIDE: 89 pg/mL (ref 0–450)

## 2013-12-26 NOTE — ED Notes (Signed)
Pt reports having several days of constant tightness across her chest with mild sob. Reports she normally gets chest pains later in afternoon, not in the morning hours. No acute distress noted at triage, airway is intact, ekg done.

## 2013-12-26 NOTE — Telephone Encounter (Signed)
TC to patient regarding "chest discomfort."  On callback, RN unable to reach patient at number given; message left on identified voicemail to contact office for assistance.  krs/can

## 2013-12-26 NOTE — Telephone Encounter (Signed)
Per Dr. Sarajane Jews, send pt to ED.  Contacted patient by telephone.  Instructed her to go to ED.  She will call for a ride and then will go.

## 2013-12-26 NOTE — Telephone Encounter (Signed)
Triage [facility] ) spoke with pt and she was going to the ER.

## 2013-12-26 NOTE — Discharge Instructions (Signed)
Follow up with [place] next week.  Return sooner if any problems

## 2013-12-26 NOTE — ED Notes (Signed)
Pt's husband provided ginger ale and graham crackers.

## 2013-12-26 NOTE — Telephone Encounter (Signed)
Patient Information:  Caller Name: Nupur  Phone: [phone]  Patient: April [name]  Gender: Female  DOB: 04-Oct-1929  Age: 80 Years  PCP: Alysia Penna [facility])  Office Follow Up:  Does the office need to follow up with this patient?: Yes  Instructions For The Office: See RN Note.  Please f/u with pt.  RN Note:  April [name] has been having chest pains intermittently for the past 2 weeks.  Denies chest pain at this time.  States she has been under a lot of stress d/t her husband having dementia.  Mild difficulty breathing and chest heaviness for the past 2 days.  Requesting appt in office.  ED disposition, called office and spoke with [place] who advised note to be sent to office for review by Dr. Sarajane Jews.  Symptoms  Reason For Call & Symptoms: Chest pain  Reviewed Health History In EMR: Yes  Reviewed Medications In EMR: Yes  Reviewed Allergies In EMR: Yes  Reviewed Surgeries / Procedures: Yes  Date of Onset of Symptoms: 12/12/2013  Guideline(s) Used:  Chest Pain  Disposition Per Guideline:   Go to ED Now  Reason For Disposition Reached:   Difficulty breathing  Advice Given:  Call Back If:  You become worse.  RN Overrode Recommendation:  Document Patient  Called office per standing orders

## 2013-12-26 NOTE — ED Provider Notes (Signed)
CSN: [id number]     Arrival date & time 12/26/13  1108 History   First MD Initiated Contact with Patient 12/26/13 1146     Chief Complaint  Patient presents with  . Chest Pain   (Consider location/radiation/quality/duration/timing/severity/associated sxs/prior Treatment) Patient is a 80 y.o. female presenting with chest pain. The history is provided by the patient (pt complains of some chest pain.  not related to breathing or exhirtion.   some increase with stres).  Chest Pain Pain location:  L chest Pain quality: aching   Pain radiates to:  Does not radiate Pain radiates to the back: no   Pain severity:  Moderate Onset quality:  Gradual Timing:  Sporadic Progression:  Waxing and waning Chronicity:  New Context: not breathing   Associated symptoms: no abdominal pain, no back pain, no cough, no fatigue and no headache     Past Medical History  Diagnosis Date  . HYPERTENSION 09/30/2009  . GERD 04/18/2007  . LOW BACK PAIN SYNDROME 07/13/2009  . ROTATOR CUFF SYNDROME 05/06/2010  . BURSITIS 01/04/2008  . COSTOCHONDRITIS 11/05/2007  . ALLERGIC RHINITIS 04/18/2007  . Melanoma     removed from upper back  . Breast cancer 2012     Dr. [name], ER/PR positive L breast  . Hx of radiation therapy 10/26/11 to 12/16/11    L chest wall, L supraclavicular region  . Adenomatous colon polyp 11/2005  . Hiatal hernia   . Internal hemorrhoids   . Anemia   . Arthritis   . History of pneumonia   . History of gallstones    Past Surgical History  Procedure Laterality Date  . Tonsillectomy and adenoidectomy    . Breast surgery  1968    left bx - noncancerous  . Joint replacement      left knee  . Excision of back melanoma    . Mastectomy  08/25/11    bilateral , per Dr. Donne Hazel  . Sentinel lymph node biopsy  08/25/11    bilateral  . Cholecystectomy  1979  . Ankle surgery      right  . Cataract extraction      bilateral   Family History  Problem Relation Age of Onset  . Stroke Father   .  Alcohol abuse Sister   . Thyroid cancer Mother   . Retinal detachment Mother   . Diabetes Paternal Uncle   . Cirrhosis Sister    History  Substance Use Topics  . Smoking status: Never Smoker   . Smokeless tobacco: Never Used  . Alcohol Use: 0.5 oz/week    1 drink(s) per week     Comment: socially   OB History   Grav Para Term Preterm Abortions TAB SAB Ect Mult Living   2 2             Obstetric Comments   twins     Review of Systems  Constitutional: Negative for appetite change and fatigue.  HENT: Negative for congestion, ear discharge and sinus pressure.   Eyes: Negative for discharge.  Respiratory: Negative for cough.   Cardiovascular: Positive for chest pain.  Gastrointestinal: Negative for abdominal pain and diarrhea.  Genitourinary: Negative for frequency and hematuria.  Musculoskeletal: Negative for back pain.  Skin: Negative for rash.  Neurological: Negative for seizures and headaches.  Psychiatric/Behavioral: Negative for hallucinations.    Allergies  Cefaclor; Lansoprazole; Penicillins; Sulfa drugs cross reactors; and Sulfamethoxazole  Home Medications   Current Outpatient Rx  Name  Route  Sig  Dispense  Refill  . aspirin EC 81 MG tablet   Oral   Take 81 mg by mouth at bedtime.         Marland Kitchen ibuprofen (ADVIL,MOTRIN) 200 MG tablet   Oral   Take 200 mg by mouth every 4 (four) hours as needed for moderate pain.          Marland Kitchen lisinopril (PRINIVIL,ZESTRIL) 20 MG tablet   Oral   Take 20 mg by mouth daily.          . meloxicam (MOBIC) 7.5 MG tablet   Oral   Take 7.5 mg by mouth 2 (two) times daily as needed for pain.         Marland Kitchen omeprazole (PRILOSEC) 40 MG capsule   Oral   Take 40 mg by mouth 2 (two) times daily.         Vladimir Faster Glycol-Propyl Glycol (SYSTANE) 0.4-0.3 % SOLN   Both Eyes   Place 1 drop into both eyes 3 (three) times daily.          . polyethylene glycol powder (GLYCOLAX/MIRALAX) powder   Oral   Take 17 g by mouth daily as  needed. For constipation.         . sodium chloride (OCEAN) 0.65 % nasal spray   Nasal   Place 1 spray into the nose at bedtime.         . solifenacin (VESICARE) 10 MG tablet   Oral   Take 10 mg by mouth daily as needed (for bladder control).           BP 126/63  Pulse 63  Temp(Src) 97.9 F (36.6 C) (Oral)  Resp 12  Ht 5\' 2"  (1.575 m)  Wt 177 lb 9.6 oz (80.559 kg)  BMI 32.48 kg/m2  SpO2 98% Physical Exam  Constitutional: She is oriented to person, place, and time. She appears well-developed.  HENT:  Head: Normocephalic.  Eyes: Conjunctivae and EOM are normal. No scleral icterus.  Neck: Neck supple. No thyromegaly present.  Cardiovascular: Normal rate and regular rhythm.  Exam reveals no gallop and no friction rub.   No murmur heard. Pulmonary/Chest: No stridor. She has no wheezes. She has no rales. She exhibits no tenderness.  Abdominal: She exhibits no distension. There is no tenderness. There is no rebound.  Musculoskeletal: Normal range of motion. She exhibits no edema.  Lymphadenopathy:    She has no cervical adenopathy.  Neurological: She is oriented to person, place, and time. She exhibits normal muscle tone. Coordination normal.  Skin: No rash noted. No erythema.  Psychiatric: She has a normal mood and affect. Her behavior is normal.    ED Course  Procedures (including critical care time) Labs Review Labs Reviewed  CBC - Abnormal; Notable for the following:    HCT 35.6 (*)    All other components within normal limits  BASIC METABOLIC PANEL - Abnormal; Notable for the following:    Glucose, Bld 120 (*)    GFR calc non Af Amer 50 (*)    GFR calc Af Amer 58 (*)    All other components within normal limits  HEPATIC FUNCTION PANEL - Abnormal; Notable for the following:    Total Bilirubin <0.2 (*)    All other components within normal limits  PRO B NATRIURETIC PEPTIDE  TROPONIN I  POCT I-STAT TROPONIN I   Imaging Review Dg Chest 2 View  12/26/2013    CLINICAL DATA:  Chest pain .  EXAM: CHEST  2  VIEW  COMPARISON:  DG CHEST 2 VIEW dated 07/26/2013; DG THORACIC SPINE dated 07/13/2009  FINDINGS: The mediastinum and hilar structures are normal. Mild chronic interstitial prominence noted suggesting mild fibrosis. Heart size and pulmonary vascularity normal. No pleural effusion or pneumothorax. Prior left mastectomy. Degenerative changes thoracic spine. Surgical clips upper abdomen.  IMPRESSION: 1. Pulmonary interstitial fibrosis. 2. Left mastectomy. 3. No acute cardiopulmonary disease.   Electronically Signed   By: Marcello Moores  Register   On: 12/26/2013 12:40    EKG Interpretation    Date/Time:  Thursday December 26 2013 11:13:20 EST Ventricular Rate:  84 PR Interval:  164 QRS Duration: 86 QT Interval:  348 QTC Calculation: 411 R Axis:   1 Text Interpretation:  Normal sinus rhythm Nonspecific ST abnormality Abnormal ECG Confirmed by Adrain Nesbit  MD, Sindi Beckworth ([id number]) on 12/26/2013 4:15:43 PM            MDM   1. Chest pain    Atypical chest pain with normal studies,  No pain at discharge.  Will refer to cards.    Maudry Diego, MD 12/26/13 1623

## 2014-01-06 ENCOUNTER — Encounter: Payer: Self-pay | Admitting: Family Medicine

## 2014-01-06 ENCOUNTER — Ambulatory Visit (INDEPENDENT_AMBULATORY_CARE_PROVIDER_SITE_OTHER): Payer: Medicare Other | Admitting: Family Medicine

## 2014-01-06 VITALS — BP 126/62 | HR 75 | Temp 98.4°F | Ht 62.0 in | Wt 178.0 lb

## 2014-01-06 DIAGNOSIS — J019 Acute sinusitis, unspecified: Secondary | ICD-10-CM

## 2014-01-06 MED ORDER — AZITHROMYCIN 250 MG PO TABS: ORAL_TABLET | ORAL | Status: DC

## 2014-01-06 NOTE — Progress Notes (Signed)
Pre visit review using our clinic review tool, if applicable. No additional management support is needed unless otherwise documented below in the visit note. 

## 2014-01-06 NOTE — Progress Notes (Signed)
   Subjective:    Patient ID: April [name], female    DOB: August 30, 1929, 80 y.o.   MRN: [id number]  HPI Here for 4 days of sinus pressure, PND, and ST. No fever. On Mucinex    Review of Systems  Constitutional: Negative.   HENT: Positive for congestion, postnasal drip and sinus pressure.   Eyes: Negative.   Respiratory: Negative.        Objective:   Physical Exam  Constitutional: She appears well-developed and well-nourished.  HENT:  Right Ear: External ear normal.  Left Ear: External ear normal.  Nose: Nose normal.  Mouth/Throat: Oropharynx is clear and moist.  Eyes: Conjunctivae are normal.  Pulmonary/Chest: Effort normal and breath sounds normal.  Lymphadenopathy:    She has no cervical adenopathy.          Assessment & Plan:  Recheck prn

## 2014-01-13 DIAGNOSIS — C44319 Basal cell carcinoma of skin of other parts of face: Secondary | ICD-10-CM | POA: Diagnosis not present

## 2014-01-14 ENCOUNTER — Ambulatory Visit: Payer: Medicare Other | Admitting: Cardiology

## 2014-01-27 ENCOUNTER — Encounter: Payer: Self-pay | Admitting: Family Medicine

## 2014-01-27 ENCOUNTER — Ambulatory Visit (INDEPENDENT_AMBULATORY_CARE_PROVIDER_SITE_OTHER): Payer: Medicare Other | Admitting: Family Medicine

## 2014-01-27 VITALS — BP 118/58 | HR 81 | Temp 98.8°F | Ht 62.0 in | Wt 179.0 lb

## 2014-01-27 DIAGNOSIS — J209 Acute bronchitis, unspecified: Secondary | ICD-10-CM

## 2014-01-27 MED ORDER — LEVOFLOXACIN 500 MG PO TABS: 500.0000 mg | ORAL_TABLET | Freq: Every day | ORAL | Status: AC

## 2014-01-27 NOTE — Progress Notes (Signed)
   Subjective:    Patient ID: April [name], female    DOB: Apr 23, 1929, 80 y.o.   MRN: [id number]  HPI Here for one week of chest tightness and coughing up yellow sputum. No fever.    Review of Systems  Constitutional: Negative.   HENT: Positive for congestion. Negative for postnasal drip and sinus pressure.   Eyes: Negative.   Respiratory: Positive for cough.        Objective:   Physical Exam  Constitutional: She appears well-developed and well-nourished.  HENT:  Right Ear: External ear normal.  Left Ear: External ear normal.  Nose: Nose normal.  Mouth/Throat: Oropharynx is clear and moist.  Eyes: Conjunctivae are normal.  Pulmonary/Chest: Effort normal and breath sounds normal. No respiratory distress. She has no wheezes. She has no rales.  Lymphadenopathy:    She has no cervical adenopathy.          Assessment & Plan:  Add Mucxinex prn

## 2014-01-27 NOTE — Progress Notes (Signed)
Pre visit review using our clinic review tool, if applicable. No additional management support is needed unless otherwise documented below in the visit note. 

## 2014-02-03 ENCOUNTER — Ambulatory Visit (INDEPENDENT_AMBULATORY_CARE_PROVIDER_SITE_OTHER): Payer: Medicare Other | Admitting: Cardiology

## 2014-02-03 ENCOUNTER — Encounter: Payer: Self-pay | Admitting: Cardiology

## 2014-02-03 VITALS — BP 142/68 | HR 66 | Ht 62.0 in | Wt 177.0 lb

## 2014-02-03 DIAGNOSIS — R079 Chest pain, unspecified: Secondary | ICD-10-CM | POA: Diagnosis not present

## 2014-02-03 DIAGNOSIS — I10 Essential (primary) hypertension: Secondary | ICD-10-CM

## 2014-02-03 DIAGNOSIS — R002 Palpitations: Secondary | ICD-10-CM
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
## 2014-02-03 NOTE — Patient Instructions (Signed)
Your physician recommends that you continue on your current medications as directed. Please refer to the Current Medication list given to you today.  Your physician recommends that you schedule a follow-up as needed.

## 2014-02-21 DIAGNOSIS — L570 Actinic keratosis: Secondary | ICD-10-CM | POA: Diagnosis not present

## 2014-02-21 DIAGNOSIS — L820 Inflamed seborrheic keratosis: Secondary | ICD-10-CM | POA: Diagnosis not present

## 2014-02-21 DIAGNOSIS — C44319 Basal cell carcinoma of skin of other parts of face: Secondary | ICD-10-CM | POA: Diagnosis not present

## 2014-03-05 ENCOUNTER — Other Ambulatory Visit (INDEPENDENT_AMBULATORY_CARE_PROVIDER_SITE_OTHER): Payer: Self-pay

## 2014-03-05 ENCOUNTER — Encounter (INDEPENDENT_AMBULATORY_CARE_PROVIDER_SITE_OTHER): Payer: Self-pay | Admitting: General Surgery

## 2014-03-05 ENCOUNTER — Ambulatory Visit (INDEPENDENT_AMBULATORY_CARE_PROVIDER_SITE_OTHER): Payer: Medicare Other | Admitting: General Surgery

## 2014-03-05 VITALS — BP 136/70 | HR 80 | Temp 97.0°F | Resp 16 | Wt 181.4 lb

## 2014-03-05 DIAGNOSIS — C50919 Malignant neoplasm of unspecified site of unspecified female breast: Secondary | ICD-10-CM | POA: Diagnosis not present

## 2014-03-05 MED ORDER — UNABLE TO FIND: Status: DC

## 2014-03-05 NOTE — Progress Notes (Signed)
Subjective:     Patient ID: April [name], female   DOB: 10-May-1929, 80 y.o.   MRN: [id number]  HPI 80 year old female who underwent bilateral mastectomies and left axillary sentinel lymph node biopsy in September of 2012. 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 Both of these were hormone receptor positive. She has undergone left postmastectomy radiotherapy. She is attempted to take anti-estrogens but has been unable to tolerate this. She will get followed up for pancreatic and renal abnormalities on mr in one year.  She is taking care of her husband now due to his dementia.  She notes no left arm issues except for her rotator cuff.  She has no changes in breast exam.   Review of Systems  Constitutional: Negative for fever, chills and unexpected weight change.  HENT: Positive for congestion. Negative for hearing loss, sore throat, trouble swallowing and voice change.   Eyes: Negative for visual disturbance.  Respiratory: Negative for cough and wheezing.   Cardiovascular: Negative for chest pain, palpitations and leg swelling.  Gastrointestinal: Positive for nausea and constipation. Negative for vomiting, abdominal pain, diarrhea, blood in stool, abdominal distention and anal bleeding.  Genitourinary: Negative for hematuria, vaginal bleeding and difficulty urinating.  Musculoskeletal: Positive for arthralgias.  Skin: Negative for rash and wound.  Neurological: Negative for seizures, syncope and headaches.  Hematological: Negative for adenopathy. Does not bruise/bleed easily.  Psychiatric/Behavioral: Negative for confusion.       Objective:   Physical Exam  Vitals reviewed. Constitutional: She appears well-developed and well-nourished.  Neck: Neck supple.  Pulmonary/Chest: Right breast exhibits no mass. Left breast exhibits skin change (c/w xrt). Left breast exhibits no mass.    Lymphadenopathy:    She has no cervical adenopathy.      She has no axillary adenopathy.       Right: No supraclavicular adenopathy present.       Left: No supraclavicular adenopathy present.       Assessment:     Stage 2 left breast cancer Stage 0 right breast cancer    Plan:     She has no clinical evidence of recurrence. Her arm is doing well except for some rotator cuff issues but she's not really able to get that taking care of right now due to her husband's issues. She's going to continue her own self exams. She will be seen in the cancer center in 6 months and I will see her back in one year.

## 2014-03-18 ENCOUNTER — Encounter: Payer: Self-pay | Admitting: Family Medicine

## 2014-03-18 ENCOUNTER — Ambulatory Visit (INDEPENDENT_AMBULATORY_CARE_PROVIDER_SITE_OTHER): Payer: Medicare Other | Admitting: Family Medicine

## 2014-03-18 VITALS — BP 130/60 | HR 75 | Temp 99.1°F | Ht 62.0 in | Wt 178.0 lb

## 2014-03-18 DIAGNOSIS — J019 Acute sinusitis, unspecified: Secondary | ICD-10-CM

## 2014-03-18 MED ORDER — LEVOFLOXACIN 500 MG PO TABS: 500.0000 mg | ORAL_TABLET | Freq: Every day | ORAL | Status: AC

## 2014-03-18 MED ORDER — HYDROCODONE-HOMATROPINE 5-1.5 MG/5ML PO SYRP: 5.0000 mL | ORAL_SOLUTION | ORAL | Status: DC | PRN

## 2014-03-18 NOTE — Progress Notes (Signed)
   Subjective:    Patient ID: [name], female    DOB: 01/07/1929, 80 y.o.   MRN: [id number]  HPI Here for one week of sinus pressure, PND, ST ,and a dry cough.    Review of Systems  Constitutional: Negative.   HENT: Positive for congestion, postnasal drip and sinus pressure.   Eyes: Negative.   Respiratory: Positive for cough.        Objective:   Physical Exam  Constitutional: She appears well-developed and well-nourished.  HENT:  Right Ear: External ear normal.  Left Ear: External ear normal.  Nose: Nose normal.  Mouth/Throat: Oropharynx is clear and moist.  Eyes: Conjunctivae are normal.  Pulmonary/Chest: Effort normal and breath sounds normal.  Lymphadenopathy:    She has no cervical adenopathy.          Assessment & Plan:  Add Mucinex.

## 2014-03-18 NOTE — Progress Notes (Signed)
Pre visit review using our clinic review tool, if applicable. No additional management support is needed unless otherwise documented below in the visit note. 

## 2014-03-24 ENCOUNTER — Other Ambulatory Visit: Payer: Self-pay | Admitting: *Deleted

## 2014-05-23 ENCOUNTER — Other Ambulatory Visit: Payer: Self-pay | Admitting: Urology

## 2014-06-03 ENCOUNTER — Other Ambulatory Visit: Payer: Self-pay | Admitting: Family Medicine

## 2014-06-04 DIAGNOSIS — M25559 Pain in unspecified hip: Secondary | ICD-10-CM | POA: Diagnosis not present

## 2014-06-20 ENCOUNTER — Telehealth: Payer: Self-pay | Admitting: Family Medicine

## 2014-06-20 NOTE — Telephone Encounter (Signed)
Done and scheduled.

## 2014-06-20 NOTE — Telephone Encounter (Signed)
Okay to schedule

## 2014-06-20 NOTE — Telephone Encounter (Signed)
Pt would like appt mon or tues. Pt states she isn't feeling well, very tired and sleeps all the time. Sometimes dizzy, just doesn't fell well.  Pt states she felt like this the year before last when she had cancer. Only sd appt.

## 2014-06-23 ENCOUNTER — Ambulatory Visit (INDEPENDENT_AMBULATORY_CARE_PROVIDER_SITE_OTHER): Payer: Medicare Other | Admitting: Family Medicine

## 2014-06-23 ENCOUNTER — Encounter: Payer: Self-pay | Admitting: Family Medicine

## 2014-06-23 VITALS — BP 106/57 | HR 62 | Temp 99.2°F | Ht 62.0 in | Wt 172.0 lb

## 2014-06-23 DIAGNOSIS — R5381 Other malaise: Secondary | ICD-10-CM

## 2014-06-23 DIAGNOSIS — J0120 Acute ethmoidal sinusitis, unspecified: Secondary | ICD-10-CM

## 2014-06-23 DIAGNOSIS — R5383 Other fatigue: Secondary | ICD-10-CM

## 2014-06-23 DIAGNOSIS — J0121 Acute recurrent ethmoidal sinusitis: Secondary | ICD-10-CM

## 2014-06-23 LAB — TSH: TSH: 1.02 u[IU]/mL (ref 0.35–4.50)

## 2014-06-23 LAB — POCT URINALYSIS DIPSTICK
Blood, UA: NEGATIVE
GLUCOSE UA: NEGATIVE
NITRITE UA: NEGATIVE
PH UA: 5.5
Spec Grav, UA: 1.015
Urobilinogen, UA: 0.2

## 2014-06-23 LAB — CBC WITH DIFFERENTIAL/PLATELET
BASOS PCT: 0.3 % (ref 0.0–3.0)
Basophils Absolute: 0 10*3/uL (ref 0.0–0.1)
EOS PCT: 3.1 % (ref 0.0–5.0)
Eosinophils Absolute: 0.2 10*3/uL (ref 0.0–0.7)
HEMATOCRIT: 35.7 % — AB (ref 36.0–46.0)
HEMOGLOBIN: 11.9 g/dL — AB (ref 12.0–15.0)
LYMPHS ABS: 2.2 10*3/uL (ref 0.7–4.0)
LYMPHS PCT: 34 % (ref 12.0–46.0)
MCHC: 33.4 g/dL (ref 30.0–36.0)
MCV: 86.2 fl (ref 78.0–100.0)
MONOS PCT: 8.4 % (ref 3.0–12.0)
Monocytes Absolute: 0.5 10*3/uL (ref 0.1–1.0)
NEUTROS ABS: 3.5 10*3/uL (ref 1.4–7.7)
Neutrophils Relative %: 54.2 % (ref 43.0–77.0)
Platelets: 217 10*3/uL (ref 150.0–400.0)
RBC: 4.14 Mil/uL (ref 3.87–5.11)
RDW: 15.4 % (ref 11.5–15.5)
WBC: 6.4 10*3/uL (ref 4.0–10.5)

## 2014-06-23 LAB — HEPATIC FUNCTION PANEL
ALT: 14 U/L (ref 0–35)
AST: 16 U/L (ref 0–37)
Albumin: 3.9 g/dL (ref 3.5–5.2)
Alkaline Phosphatase: 71 U/L (ref 39–117)
BILIRUBIN DIRECT: 0.1 mg/dL (ref 0.0–0.3)
BILIRUBIN TOTAL: 0.5 mg/dL (ref 0.2–1.2)
Total Protein: 6.4 g/dL (ref 6.0–8.3)

## 2014-06-23 LAB — BASIC METABOLIC PANEL
BUN: 23 mg/dL (ref 6–23)
CHLORIDE: 103 meq/L (ref 96–112)
CO2: 28 mEq/L (ref 19–32)
CREATININE: 1.2 mg/dL (ref 0.4–1.2)
Calcium: 9.8 mg/dL (ref 8.4–10.5)
GFR: 43.95 mL/min — AB (ref >60.00)
Glucose, Bld: 117 mg/dL — ABNORMAL HIGH (ref 70–99)
POTASSIUM: 4.6 meq/L (ref 3.5–5.1)
Sodium: 136 mEq/L (ref 135–145)

## 2014-06-23 LAB — VITAMIN D 25 HYDROXY (VIT D DEFICIENCY, FRACTURES): VITD: 27.29 ng/mL — ABNORMAL LOW (ref 30.00–100.00)

## 2014-06-23 LAB — VITAMIN B12: Vitamin B-12: 278 pg/mL (ref 211–911)

## 2014-06-23 MED ORDER — LEVOFLOXACIN 500 MG PO TABS: 500.0000 mg | ORAL_TABLET | Freq: Every day | ORAL | Status: DC

## 2014-06-23 NOTE — Progress Notes (Signed)
Pre visit review using our clinic review tool, if applicable. No additional management support is needed unless otherwise documented below in the visit note. 

## 2014-06-23 NOTE — Progress Notes (Signed)
   Subjective:    Patient ID: April [name], female    DOB: 07-May-1929, 80 y.o.   MRN: [id number]  HPI Here for 2 weeks of fatigue, sinus pressure, PND and a dry cough. No fever.    Review of Systems  Constitutional: Negative.   HENT: Positive for congestion and postnasal drip.   Eyes: Negative.   Respiratory: Positive for cough.        Objective:   Physical Exam  Constitutional: She appears well-developed and well-nourished.  HENT:  Right Ear: External ear normal.  Left Ear: External ear normal.  Nose: Nose normal.  Mouth/Throat: Oropharynx is clear and moist.  Eyes: Conjunctivae are normal.  Pulmonary/Chest: Effort normal and breath sounds normal.  Lymphadenopathy:    She has no cervical adenopathy.          Assessment & Plan:  Treat with Levaquin. Get labs today

## 2014-06-25 ENCOUNTER — Ambulatory Visit: Payer: Self-pay | Admitting: Family Medicine

## 2014-06-25 ENCOUNTER — Telehealth: Payer: Self-pay | Admitting: Family Medicine

## 2014-06-25 MED ORDER — ALBUTEROL SULFATE HFA 108 (90 BASE) MCG/ACT IN AERS: 2.0000 | INHALATION_SPRAY | RESPIRATORY_TRACT | Status: DC | PRN

## 2014-06-25 NOTE — Telephone Encounter (Signed)
Patient Information:  Caller Name: April [name]  Phone: [phone]  Patient: April [name]  Gender: Female  DOB: July 29, 1929  Age: 80 Years  PCP: April [name] [facility])  Office Follow Up:  Does the office need to follow up with this patient?: Yes  Instructions For The Office: Per disposition of age greater than 30 with fever of 100.6 appointment scheduled in next available slot for Dr. Sarajane Jews.  Recommendation is that patient be seen within the next four hours.  Patient was previously evaluated on 06/23/14 and started on Levaquin and has taken two doses with next dose due today at 14:00.   Office note: Please follow up with patient if Dr. Sarajane Jews would like to have patient seen sooner than first available or if other recommendations for treatment.  RN Note:  Patient states that she has taken the Levaquin as directed.  She still has cough, congestion, sore throat.  States it feels like she has something that she just cant cough up.  Now has a fever of 100.6 orally.  Symptoms  Reason For Call & Symptoms: Sinus pressure, cough, sore throat was seen in office 06/23/14 and placed on Levaquin for treatment  Reviewed Health History In EMR: Yes  Reviewed Medications In EMR: Yes  Reviewed Allergies In EMR: Yes  Reviewed Surgeries / Procedures: Yes  Date of Onset of Symptoms: 06/23/2014  Treatments Tried: Levaquin  once daily starting on 06/23/14 has had two doses  Treatments Tried Worked: No  Any Fever: Yes  Fever Taken: Oral  Fever Time Of Reading: 10:00:00  Fever Last Reading: 100.6  Guideline(s) Used:  Colds  Disposition Per Guideline:   Go to Office Now  Reason For Disposition Reached:   Fever > 100.5 F (38.1 C) and over 48 years of age  Advice Given:  Call Back If:  Difficulty breathing occurs  You become worse  Appointment Scheduled:  06/25/2014 15:45:00 Appointment Scheduled Provider:  Alysia [name] (Family Practice)

## 2014-06-25 NOTE — Telephone Encounter (Signed)
Stay on current meds (the Levaquin is an excellent antibiotic) but she may use an inhaler to open up her lungs. Call in Ventolin HFA to use 2 puffs q 4 hours prn SOB, #1 with 2 rf. Follow up as needed

## 2014-06-25 NOTE — Telephone Encounter (Signed)
I sent script e-scribe and spoke with pt. 

## 2014-06-25 NOTE — Telephone Encounter (Signed)
Pt was on schedule, now she is not.

## 2014-07-03 ENCOUNTER — Other Ambulatory Visit: Payer: Self-pay | Admitting: Family Medicine

## 2014-07-03 NOTE — Telephone Encounter (Signed)
Per Dr. Sarajane Jews, okay to give refill.

## 2014-07-07 ENCOUNTER — Other Ambulatory Visit: Payer: Self-pay | Admitting: Family Medicine

## 2014-07-16 ENCOUNTER — Telehealth: Payer: Self-pay | Admitting: Nurse Practitioner

## 2014-07-16 ENCOUNTER — Other Ambulatory Visit: Payer: Self-pay | Admitting: *Deleted

## 2014-07-16 DIAGNOSIS — C50919 Malignant neoplasm of unspecified site of unspecified female breast: Secondary | ICD-10-CM

## 2014-07-16 NOTE — Telephone Encounter (Signed)
pt cld to get lab appt moved to [place] -pt aware of new time

## 2014-07-17 ENCOUNTER — Ambulatory Visit
Admission: RE | Admit: 2014-07-17 | Discharge: 2014-07-17 | Disposition: A | Discharge status: Home / Self Care | Payer: Medicare Other | Source: Ambulatory Visit | Attending: Oncology | Admitting: Oncology

## 2014-07-17 ENCOUNTER — Other Ambulatory Visit (HOSPITAL_BASED_OUTPATIENT_CLINIC_OR_DEPARTMENT_OTHER): Payer: Medicare Other

## 2014-07-17 ENCOUNTER — Other Ambulatory Visit: Payer: Medicare Other

## 2014-07-17 DIAGNOSIS — C50919 Malignant neoplasm of unspecified site of unspecified female breast: Secondary | ICD-10-CM | POA: Diagnosis not present

## 2014-07-17 DIAGNOSIS — K862 Cyst of pancreas: Secondary | ICD-10-CM | POA: Diagnosis not present

## 2014-07-17 DIAGNOSIS — K863 Pseudocyst of pancreas: Secondary | ICD-10-CM | POA: Diagnosis not present

## 2014-07-17 DIAGNOSIS — C089 Malignant neoplasm of major salivary gland, unspecified: Secondary | ICD-10-CM | POA: Diagnosis not present

## 2014-07-17 DIAGNOSIS — K8689 Other specified diseases of pancreas: Secondary | ICD-10-CM

## 2014-07-17 LAB — CBC WITH DIFFERENTIAL/PLATELET
BASO%: 0.7 % (ref 0.0–2.0)
BASOS ABS: 0 10*3/uL (ref 0.0–0.1)
EOS%: 2.5 % (ref 0.0–7.0)
Eosinophils Absolute: 0.2 10*3/uL (ref 0.0–0.5)
HEMATOCRIT: 36.4 % (ref 34.8–46.6)
HEMOGLOBIN: 11.8 g/dL (ref 11.6–15.9)
LYMPH%: 21.2 % (ref 14.0–49.7)
MCH: 28 pg (ref 25.1–34.0)
MCHC: 32.4 g/dL (ref 31.5–36.0)
MCV: 86.3 fL (ref 79.5–101.0)
MONO#: 0.5 10*3/uL (ref 0.1–0.9)
MONO%: 7.6 % (ref 0.0–14.0)
NEUT%: 68 % (ref 38.4–76.8)
NEUTROS ABS: 4.7 10*3/uL (ref 1.5–6.5)
Platelets: 207 10*3/uL (ref 145–400)
RBC: 4.22 10*6/uL (ref 3.70–5.45)
RDW: 14.9 % — ABNORMAL HIGH (ref 11.2–14.5)
WBC: 7 10*3/uL (ref 3.9–10.3)
lymph#: 1.5 10*3/uL (ref 0.9–3.3)

## 2014-07-17 LAB — COMPREHENSIVE METABOLIC PANEL (CC13)
ALT: 6 U/L (ref 0–55)
ANION GAP: 8 meq/L (ref 3–11)
AST: 11 U/L (ref 5–34)
Albumin: 3.7 g/dL (ref 3.5–5.0)
Alkaline Phosphatase: 66 U/L (ref 40–150)
BILIRUBIN TOTAL: 0.32 mg/dL (ref 0.20–1.20)
BUN: 21.1 mg/dL (ref 7.0–26.0)
CALCIUM: 9.8 mg/dL (ref 8.4–10.4)
CO2: 27 mEq/L (ref 22–29)
Chloride: 106 mEq/L (ref 98–109)
Creatinine: 1 mg/dL (ref 0.6–1.1)
Glucose: 91 mg/dl (ref 70–140)
Potassium: 4.2 mEq/L (ref 3.5–5.1)
Sodium: 141 mEq/L (ref 136–145)
TOTAL PROTEIN: 6.4 g/dL (ref 6.4–8.3)

## 2014-07-17 MED ORDER — GADOBENATE DIMEGLUMINE 529 MG/ML IV SOLN
8.0000 mL | Freq: Once | INTRAVENOUS | Status: AC | PRN
  Administered 2014-07-17: 8 mL via INTRAVENOUS

## 2014-07-22 ENCOUNTER — Other Ambulatory Visit: Payer: Self-pay | Admitting: Family Medicine

## 2014-07-24 ENCOUNTER — Other Ambulatory Visit: Payer: Medicare Other | Admitting: Lab

## 2014-07-24 ENCOUNTER — Telehealth: Payer: Self-pay | Admitting: Oncology

## 2014-07-24 ENCOUNTER — Encounter: Payer: Self-pay | Admitting: Nurse Practitioner

## 2014-07-24 ENCOUNTER — Ambulatory Visit (HOSPITAL_BASED_OUTPATIENT_CLINIC_OR_DEPARTMENT_OTHER): Payer: Medicare Other | Admitting: Nurse Practitioner

## 2014-07-24 VITALS — BP 141/47 | HR 76 | Temp 98.7°F | Resp 18 | Ht 62.0 in | Wt 170.8 lb

## 2014-07-24 DIAGNOSIS — D369 Benign neoplasm, unspecified site: Secondary | ICD-10-CM | POA: Insufficient documentation

## 2014-07-24 DIAGNOSIS — K863 Pseudocyst of pancreas: Secondary | ICD-10-CM

## 2014-07-24 DIAGNOSIS — G4700 Insomnia, unspecified: Secondary | ICD-10-CM | POA: Insufficient documentation

## 2014-07-24 DIAGNOSIS — C50919 Malignant neoplasm of unspecified site of unspecified female breast: Secondary | ICD-10-CM | POA: Diagnosis not present

## 2014-07-24 DIAGNOSIS — K862 Cyst of pancreas: Secondary | ICD-10-CM

## 2014-07-24 NOTE — Telephone Encounter (Signed)
per pof to sch pt appt-gave pt copy of sch °

## 2014-07-24 NOTE — Progress Notes (Signed)
ID: April [name]   DOB: 1929-08-23  MR#: [id number]  PYP#:[id number]  PCP: Laurey Morale, MD GYN:  SURolm Bookbinder OTHER MD: Franchot Gallo, Kyung Rudd, Lucio Edward, Crista Luria  CHIEF COMPLAINT: bilateral breast cancer, status post bilateral mastectomies CURRENT THERAPY: observation  BREAST CANCER HISTORY: According to Dr. Julien Girt 02/23/2011 note: She presented with a palpable mass in her left breast.  She was referred for a mammogram on 02/14/2011.  At that time diagnostic mammogram and ultrasound showed scattered fibroglandular densities in the lateral aspect of the left breast there was an irregular spiculated mass measuring 3.5 x 4.1 x 3.8 cm.  The physical exam at that time showed a hard palpable mass at 3 o'clock position in the left breast with some skin retraction.  Ultrasound was performed, which showed a hypoechoic mass with shadowing 10 cm from the nipple measuring 2.2 x 2.0 mm.  In the right breast there was two hypoechoic masses at 12 o'clock position 3 cm from the nipple measuring 5 x 5 mm each.  Biopsies recommended.  Biopsy on the suspicious lymph node was done at the same time is the primary mass on 02/14/2011.  Pathology showed this to be invasive mammary carcinoma.  The left axillary mass has insufficient tissue for diagnosis, the tumor was ER and PR positive, 100% strong staining intensity, proliferative index low at 36%.  The HER-2 ratio was 1.19.  The patient underwent a MRI scan of both breasts on 02/21/2011.  In the tail of the left breast there was a mass measuring 2.7 x 2.6 x 2.9 cm medial margin of both pectoralis muscle.  In the right breast there was clumped nodular enhancement at 12 o'clock position.  This area measured 1.8 cm.  Sentinel left axillary lymph node was also noted, which appeared to be suspicious. Her subsequent history is as detailed below.  INTERVAL HISTORY: April [name] returns today for follow up of her bilateral breast cancer and oncocytoma. She  continues to do well with no physical complaints besides left shoulder pain. She states she has a rotator cuff injury to this location but can't have surgery b/c of logistical reasons. She continues to struggle with her husband's care as he battles Alzheimer's disease. Her son lives in [place] and comes up as often as he can. She is still active with her church and can leave her husband at home for small periods of time. However, she knows that with the declining nature of his illness and his refusal to take his medications, she will need to seek help with his care.   REVIEW OF SYSTEMS: April [name] denies fevers, chills, nausea, vomiting, constipation or diarrhea. She has nocturia 2-3 times a night and she is not sleeping well because of this. Her right ankle is chronically swollen because of an accident in her past. She experiences arthritis in her knees, her left knee is status post replacement.    PAST MEDICAL HISTORY: Past Medical History  Diagnosis Date  . HYPERTENSION 09/30/2009  . GERD 04/18/2007  . LOW BACK PAIN SYNDROME 07/13/2009  . ROTATOR CUFF SYNDROME 05/06/2010  . BURSITIS 01/04/2008  . COSTOCHONDRITIS 11/05/2007  . ALLERGIC RHINITIS 04/18/2007  . Melanoma     removed from upper back  . Breast cancer 2012     Dr. [name], ER/PR positive L breast  . Hx of radiation therapy 10/26/11 to 12/16/11    L chest wall, L supraclavicular region  . Adenomatous colon polyp 11/2005  . Hiatal hernia   . Internal  hemorrhoids   . Anemia   . Arthritis   . History of pneumonia   . History of gallstones     PAST SURGICAL HISTORY: Past Surgical History  Procedure Laterality Date  . Tonsillectomy and adenoidectomy    . Breast surgery  1968    left bx - noncancerous  . Joint replacement      left knee  . Excision of back melanoma    . Mastectomy  08/25/11    bilateral , per Dr. Donne Hazel  . Sentinel lymph node biopsy  08/25/11    bilateral  . Cholecystectomy  1979  . Ankle surgery      right  .  Cataract extraction      bilateral    FAMILY HISTORY Family History  Problem Relation Age of Onset  . Stroke Father   . Alcohol abuse Sister   . Thyroid cancer Mother   . Retinal detachment Mother   . Diabetes Paternal Uncle   . Cirrhosis Sister    the patient's father died at the age of 69 following a stroke. He had an independent in driving up to one month prior to dying. [name] died from thyroid cancer at the age of 60. This had been diagnosed less than a year before her death. The patient had one brother and one sister. There is no history of breast or ovarian cancer in the family to her knowledge.  GYNECOLOGIC HISTORY: Menarche age 6, first live birth age 18, she is [facility] P2. She underwent menopause around 21. She did not take hormone replacement.  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 Her husband Casey Burkitt [facility] is a retired Art gallery manager. Unfortunately she tells me he is severely demented at present. She is the primary caregiver. Son Casey Burkitt [place] the third lives in [place] and works for the [employer]. Daughter [name] lives in [place] and works with a [employer]. The patient has 3 grandchildren. She attends a [employer].   ADVANCED DIRECTIVES: In place  HEALTH MAINTENANCE: History  Substance Use Topics  . Smoking status: Never Smoker   . Smokeless tobacco: Never Used  . Alcohol Use: 0.5 oz/week    1 drink(s) per week     Comment: socially     Colonoscopy:  PAP:  Bone density:  Lipid panel:  Allergies  Allergen Reactions  . Cefaclor Hives and Itching    Purple splotches  . Lansoprazole Hives and Itching    Purple splotches, hurting  . Penicillins Hives and Itching    Purple splotches  . Sulfa Drugs Cross Reactors Other (See Comments)    Purple whelps, itch, "just go crazy"    Current Outpatient Prescriptions  Medication Sig Dispense Refill  .  albuterol (PROVENTIL HFA;VENTOLIN HFA) 108 (90 BASE) MCG/ACT inhaler Inhale 2 puffs into the lungs every 4 (four) hours as needed for wheezing or shortness of breath.  1 Inhaler  2  . aspirin EC 81 MG tablet Take 81 mg by mouth at bedtime.      Marland Kitchen HYDROcodone-homatropine (HYDROMET) 5-1.5 MG/5ML syrup Take 5 mLs by mouth every 4 (four) hours as needed for cough.  240 mL  0  . ibuprofen (ADVIL,MOTRIN) 200 MG tablet Take 200 mg by mouth every 4 (four) hours as needed for moderate pain.       Marland Kitchen levofloxacin (LEVAQUIN) 500 MG tablet TAKE 1 TABLET DAILY.  10 tablet  0  . lisinopril (PRINIVIL,ZESTRIL) 20  MG tablet TAKE 1 TABLET ONCE DAILY.  30 tablet  0  . meloxicam (MOBIC) 7.5 MG tablet Take 7.5 mg by mouth daily as needed.       Marland Kitchen omeprazole (PRILOSEC) 40 MG capsule TAKE (1) CAPSULE TWICE DAILY.  60 capsule  11  . Polyethyl Glycol-Propyl Glycol (SYSTANE) 0.4-0.3 % SOLN Place 1 drop into both eyes 3 (three) times daily.       . sodium chloride (OCEAN) 0.65 % nasal spray Place 1 spray into the nose at bedtime.       No current facility-administered medications for this visit.    OBJECTIVE: April [name] who appears stated age 5 Vitals:   07/24/14 1454  BP: 141/47  Pulse: 76  Temp: 98.7 F (37.1 C)  Resp: 18     Body mass index is 31.23 kg/(m^2).    ECOG FS: 1  Skin: warm, dry  HEENT: sclerae anicteric, conjunctivae pink, oropharynx clear. No thrush or mucositis.  Lymph Nodes: No cervical or supraclavicular lymphadenopathy  Lungs: clear to auscultation bilaterally, no rales, wheezes, or rhonci  Heart: regular rate and rhythm  Abdomen: round, soft, non tender, positive bowel sounds  Musculoskeletal: No focal spinal tenderness, right ankle 2+ non-pitting edema, uses cane to ambulate  Neuro: non focal, well oriented, positive affect  Breasts: status post bilateral mastectomies. No evidence of local recurrence. Bilateral axillae benign.    LAB RESULTS: Lab Results  Component Value  Date   WBC 7.0 07/17/2014   NEUTROABS 4.7 07/17/2014   HGB 11.8 07/17/2014   HCT 36.4 07/17/2014   MCV 86.3 07/17/2014   PLT 207 07/17/2014      Chemistry      Component Value Date/Time   NA 141 07/17/2014 0910   NA 136 06/23/2014 1516   K 4.2 07/17/2014 0910   K 4.6 06/23/2014 1516   CL 103 06/23/2014 1516   CL 108* 12/25/2012 1235   CO2 27 07/17/2014 0910   CO2 28 06/23/2014 1516   BUN 21.1 07/17/2014 0910   BUN 23 06/23/2014 1516   CREATININE 1.0 07/17/2014 0910   CREATININE 1.2 06/23/2014 1516      Component Value Date/Time   CALCIUM 9.8 07/17/2014 0910   CALCIUM 9.8 06/23/2014 1516   ALKPHOS 66 07/17/2014 0910   ALKPHOS 71 06/23/2014 1516   AST 11 07/17/2014 0910   AST 16 06/23/2014 1516   ALT 6 07/17/2014 0910   ALT 14 06/23/2014 1516   BILITOT 0.32 07/17/2014 0910   BILITOT 0.5 06/23/2014 1516       Lab Results  Component Value Date   LABCA2 12 12/25/2012    No components found with this basename: [id number]    No results found for this basename: INR,  in the last 168 hours  Urinalysis    Component Value Date/Time   COLORURINE yellow 05/24/2010 1555   APPEARANCEUR Clear 05/24/2010 1555   LABSPEC <1.005 05/24/2010 1555   PHURINE 5.5 05/24/2010 [place] 08/19/2009 1918   HGBUR negative 05/24/2010 [place] 08/19/2009 1918   BILIRUBINUR 1+ 06/23/2014 1549   BILIRUBINUR negative 05/24/2010 1555   KETONESUR NEGATIVE 08/19/2009 1918   PROTEINUR trace 06/23/2014 1549   PROTEINUR NEGATIVE 08/19/2009 1918   UROBILINOGEN 0.2 06/23/2014 1549   UROBILINOGEN 0.2 05/24/2010 1555   NITRITE n 06/23/2014 1549   NITRITE negative 05/24/2010 1555   LEUKOCYTESUR moderate (2+) 06/23/2014 1549    STUDIES: Mr Abdomen W Wo Contrast  07/17/2014  CLINICAL DATA:  Followup pancreas lesion.  EXAM: MRI ABDOMEN WITHOUT AND WITH CONTRAST  TECHNIQUE: Multiplanar multisequence MR imaging of the abdomen was performed both before and after the administration of intravenous contrast.   CONTRAST:  52mL MULTIHANCE GADOBENATE DIMEGLUMINE 529 MG/ML IV SOLN  COMPARISON:  07/17/2013  FINDINGS: No pleural effusion.  There is no pericardial effusion.  Multiple liver cysts are again identified. Mild steatosis noted. Stable hyperenhancing lesion within the posterior right hepatic lobe measuring 1.6 cm, image 44 of series 13. No suspicious liver lesions identified. Previous cholecystectomy. Cystic lesion within the pancreatic uncinate process measures 1.6 cm, image 20/ series 6. Similar to prior exam. This may communicate with the pancreatic duct in this region. Normal appearance of the spleen.  The adrenal glands are on unremarkable. Bilateral renal cysts are again noted. Enhancing lesion arising from the upper pole of the right kidney is unchanged. This measures 2.9 x 2.8 cm, image 64/21. Previously 3.3 x 2.8 cm.  Normal caliber of the abdominal aorta. No aneurysm. There is no upper abdominal adenopathy identified. Similar appearance of foci of abnormal signal within the thoracic and lumbar spine.  IMPRESSION: 1. Similar size of cystic lesion in the pancreatic uncinate process. Differential considerations of pseudocyst versus side branch intraductal papillary mucinous tumor (indolent neoplasm). No further followup of this lesion is indicated. 2. Stable right renal lesion, biopsy proven oncocytoma on 12/20/2012   Electronically Signed   By: Kerby Moors M.D.   On: 07/17/2014 13:59    ASSESSMENT: 80 y.o. 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  (2) status post bilateral mastectomies 08/25/2011, showing  (a) on the right, ductal carcinoma in situ, low-grade, estrogen receptor 100% and progesterone receptor 80% positive, with ample margins  (b) on the left, a pT2 pN1, stage IIB invasive ductal carcinoma, grade 1, 100% estrogen and 100% progesterone receptor positive, with an  MIB-1 of 36%, and no HER-2 amplification.  (3) left postmastectomy radiation completed 12/27/2011  (4) took aromatase inhibitors (letrozole and anastrozole) prior to her bilateral mastectomies, with poor tolerance. Took tamoxifen briefly after completing radiation, and again with poor tolerance. 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 The labs were reviewed in detail with her and were entirely normal. We reviewed her abdominal MRI and her oncocytoma is unchanged as well as the presumed pseudocyst in her pancreas. April [name] will try $Remove'3mg'KMfVmlC$  melatonin QHS to help with her insomnia, but this is likely due to the interruption in her sleep patter from the nocturia.   I encouraged April [name] to seek help with her husband's care. She is visibly worn out and expresses frustration with herself and her husband for his state of health. I fear with time she will become overwhelmed. I reminded her of Dr. Virgie Dad suggestion to read "The 49 Hour Day" for suggestions on how to proceed. She states when her son visits again she will have a frank conversation with him about their options.   April [name] will return next year after repeat abdominal MRI for labs and an office visit. If the scan again shows stable results we will probably stop repeating them. She understands and agrees with this  plan. She has been encouraged to call with any issues that might arise her before her next visit.   April [name], Heather L    07/24/2014

## 2014-07-25 ENCOUNTER — Other Ambulatory Visit: Payer: Self-pay | Admitting: *Deleted

## 2014-07-28 DIAGNOSIS — M25579 Pain in unspecified ankle and joints of unspecified foot: Secondary | ICD-10-CM | POA: Diagnosis not present

## 2014-07-29 DIAGNOSIS — H04129 Dry eye syndrome of unspecified lacrimal gland: Secondary | ICD-10-CM | POA: Diagnosis not present

## 2014-07-29 DIAGNOSIS — H18599 Other hereditary corneal dystrophies, unspecified eye: Secondary | ICD-10-CM | POA: Diagnosis not present

## 2014-07-29 DIAGNOSIS — Z961 Presence of intraocular lens: Secondary | ICD-10-CM | POA: Diagnosis not present

## 2014-07-31 ENCOUNTER — Telehealth: Payer: Self-pay | Admitting: Adult Health

## 2014-07-31 NOTE — Telephone Encounter (Signed)
cld trns to me stating pt stated pt was suppose to come back in Nove-Check pof's no order ever put in-trnas pt to Natro([facility])nurse to check for pt

## 2014-08-05 ENCOUNTER — Telehealth: Payer: Self-pay | Admitting: Oncology

## 2014-08-05 ENCOUNTER — Other Ambulatory Visit: Payer: Self-pay | Admitting: Family Medicine

## 2014-08-05 NOTE — Telephone Encounter (Signed)
s.w. pt and advised on Aug appt....pt ok and aware...pt requested mailing...done...mailed pt appt sched/avs and letter

## 2014-08-22 DIAGNOSIS — L259 Unspecified contact dermatitis, unspecified cause: Secondary | ICD-10-CM | POA: Diagnosis not present

## 2014-09-02 DIAGNOSIS — Z23 Encounter for immunization: Secondary | ICD-10-CM | POA: Diagnosis not present

## 2014-09-29 ENCOUNTER — Encounter: Payer: Self-pay | Admitting: Nurse Practitioner

## 2014-09-29 DIAGNOSIS — D3000 Benign neoplasm of unspecified kidney: Secondary | ICD-10-CM | POA: Diagnosis not present

## 2014-10-06 ENCOUNTER — Ambulatory Visit (INDEPENDENT_AMBULATORY_CARE_PROVIDER_SITE_OTHER): Payer: Medicare Other | Admitting: Family Medicine

## 2014-10-06 ENCOUNTER — Encounter: Payer: Self-pay | Admitting: Family Medicine

## 2014-10-06 VITALS — BP 128/61 | HR 66 | Temp 98.5°F | Ht 62.0 in | Wt 172.0 lb

## 2014-10-06 DIAGNOSIS — J209 Acute bronchitis, unspecified: Secondary | ICD-10-CM | POA: Diagnosis not present

## 2014-10-06 MED ORDER — LEVOFLOXACIN 500 MG PO TABS: 500.0000 mg | ORAL_TABLET | Freq: Every day | ORAL | Status: DC

## 2014-10-06 NOTE — Progress Notes (Signed)
Pre visit review using our clinic review tool, if applicable. No additional management support is needed unless otherwise documented below in the visit note. 

## 2014-10-06 NOTE — Progress Notes (Signed)
   Subjective:    Patient ID: April [name], female    DOB: 1929/02/04, 80 y.o.   MRN: [id number]  HPI Here for 3 days of aches, chills, PND, ST , and a dry cough.    Review of Systems  Constitutional: Positive for chills. Negative for fever.  HENT: Positive for congestion and postnasal drip.   Eyes: Negative.   Respiratory: Positive for cough.        Objective:   Physical Exam  Constitutional: She appears well-developed and well-nourished.  HENT:  Right Ear: External ear normal.  Left Ear: External ear normal.  Nose: Nose normal.  Mouth/Throat: Oropharynx is clear and moist.  Eyes: Conjunctivae are normal.  Pulmonary/Chest: Effort normal and breath sounds normal.  Lymphadenopathy:    She has no cervical adenopathy.          Assessment & Plan:  Add Mucinex

## 2014-10-09 DIAGNOSIS — D485 Neoplasm of uncertain behavior of skin: Secondary | ICD-10-CM | POA: Diagnosis not present

## 2014-10-10 DIAGNOSIS — B354 Tinea corporis: Secondary | ICD-10-CM | POA: Diagnosis not present

## 2014-10-15 ENCOUNTER — Other Ambulatory Visit: Payer: Self-pay | Admitting: Family Medicine

## 2014-10-15 NOTE — Telephone Encounter (Signed)
Per Dr. Sarajane Jews, okay to give to refill.

## 2014-11-01 DIAGNOSIS — H1859 Other hereditary corneal dystrophies: Secondary | ICD-10-CM | POA: Diagnosis not present

## 2014-11-01 DIAGNOSIS — Z961 Presence of intraocular lens: Secondary | ICD-10-CM | POA: Diagnosis not present

## 2014-11-01 DIAGNOSIS — H04123 Dry eye syndrome of bilateral lacrimal glands: Secondary | ICD-10-CM | POA: Diagnosis not present

## 2014-12-04 ENCOUNTER — Encounter: Payer: Self-pay | Admitting: Family Medicine

## 2014-12-04 ENCOUNTER — Ambulatory Visit (INDEPENDENT_AMBULATORY_CARE_PROVIDER_SITE_OTHER): Payer: Medicare Other | Admitting: Family Medicine

## 2014-12-04 VITALS — BP 128/60 | HR 71 | Temp 99.0°F | Ht 62.0 in | Wt 175.0 lb

## 2014-12-04 DIAGNOSIS — J0100 Acute maxillary sinusitis, unspecified: Secondary | ICD-10-CM

## 2014-12-04 MED ORDER — LISINOPRIL 20 MG PO TABS: 20.0000 mg | ORAL_TABLET | Freq: Every day | ORAL | Status: DC

## 2014-12-04 MED ORDER — LEVOFLOXACIN 500 MG PO TABS: 500.0000 mg | ORAL_TABLET | Freq: Every day | ORAL | Status: AC

## 2014-12-04 NOTE — Progress Notes (Signed)
   Subjective:    Patient ID: [name], female    DOB: February 09, 1929, 81 y.o.   MRN: [id number]  HPI Here for 3 days of fever, sinus pressure, and PND. No cough.    Review of Systems  Constitutional: Positive for fever.  HENT: Positive for congestion, postnasal drip and sinus pressure.   Eyes: Negative.   Respiratory: Negative.        Objective:   Physical Exam  Constitutional: She appears well-developed and well-nourished.  HENT:  Right Ear: External ear normal.  Left Ear: External ear normal.  Nose: Nose normal.  Mouth/Throat: Oropharynx is clear and moist.  Eyes: Conjunctivae are normal.  Pulmonary/Chest: Effort normal and breath sounds normal.  Lymphadenopathy:    She has no cervical adenopathy.          Assessment & Plan:  Add Mucinex

## 2014-12-04 NOTE — Progress Notes (Signed)
Pre visit review using our clinic review tool, if applicable. No additional management support is needed unless otherwise documented below in the visit note. 

## 2015-01-06 DIAGNOSIS — L820 Inflamed seborrheic keratosis: Secondary | ICD-10-CM | POA: Diagnosis not present

## 2015-01-06 DIAGNOSIS — B354 Tinea corporis: Secondary | ICD-10-CM | POA: Diagnosis not present

## 2015-01-26 ENCOUNTER — Ambulatory Visit (INDEPENDENT_AMBULATORY_CARE_PROVIDER_SITE_OTHER): Payer: Medicare Other | Admitting: Family Medicine

## 2015-01-26 ENCOUNTER — Encounter: Payer: Self-pay | Admitting: Family Medicine

## 2015-01-26 VITALS — BP 120/74 | HR 77 | Temp 98.3°F | Wt 174.5 lb

## 2015-01-26 DIAGNOSIS — I10 Essential (primary) hypertension: Secondary | ICD-10-CM

## 2015-01-26 DIAGNOSIS — R5383 Other fatigue: Secondary | ICD-10-CM

## 2015-01-26 DIAGNOSIS — J0100 Acute maxillary sinusitis, unspecified: Secondary | ICD-10-CM

## 2015-01-26 DIAGNOSIS — R739 Hyperglycemia, unspecified: Secondary | ICD-10-CM

## 2015-01-26 LAB — HEMOGLOBIN A1C: HEMOGLOBIN A1C: 6 % (ref 4.6–6.5)

## 2015-01-26 LAB — CBC WITH DIFFERENTIAL/PLATELET
BASOS PCT: 0.8 % (ref 0.0–3.0)
Basophils Absolute: 0.1 10*3/uL (ref 0.0–0.1)
EOS ABS: 0.2 10*3/uL (ref 0.0–0.7)
Eosinophils Relative: 2.4 % (ref 0.0–5.0)
HCT: 36.7 % (ref 36.0–46.0)
Hemoglobin: 12.3 g/dL (ref 12.0–15.0)
LYMPHS PCT: 29.8 % (ref 12.0–46.0)
Lymphs Abs: 2.4 10*3/uL (ref 0.7–4.0)
MCHC: 33.5 g/dL (ref 30.0–36.0)
MCV: 84.3 fl (ref 78.0–100.0)
Monocytes Absolute: 0.7 10*3/uL (ref 0.1–1.0)
Monocytes Relative: 9.1 % (ref 3.0–12.0)
NEUTROS ABS: 4.6 10*3/uL (ref 1.4–7.7)
Neutrophils Relative %: 57.9 % (ref 43.0–77.0)
Platelets: 240 10*3/uL (ref 150.0–400.0)
RBC: 4.34 Mil/uL (ref 3.87–5.11)
RDW: 15.2 % (ref 11.5–15.5)
WBC: 7.9 10*3/uL (ref 4.0–10.5)

## 2015-01-26 LAB — BASIC METABOLIC PANEL
BUN: 26 mg/dL — AB (ref 6–23)
CALCIUM: 10.2 mg/dL (ref 8.4–10.5)
CO2: 28 mEq/L (ref 19–32)
CREATININE: 1.16 mg/dL (ref 0.40–1.20)
Chloride: 102 mEq/L (ref 96–112)
GFR: 47.39 mL/min — AB (ref >60.00)
GLUCOSE: 80 mg/dL (ref 70–99)
Potassium: 4.7 mEq/L (ref 3.5–5.1)
Sodium: 136 mEq/L (ref 135–145)

## 2015-01-26 LAB — HEPATIC FUNCTION PANEL
ALT: 12 U/L (ref 0–35)
AST: 13 U/L (ref 0–37)
Albumin: 4.2 g/dL (ref 3.5–5.2)
Alkaline Phosphatase: 72 U/L (ref 39–117)
BILIRUBIN DIRECT: 0.1 mg/dL (ref 0.0–0.3)
BILIRUBIN TOTAL: 0.3 mg/dL (ref 0.2–1.2)
Total Protein: 7 g/dL (ref 6.0–8.3)

## 2015-01-26 LAB — TSH: TSH: 0.64 u[IU]/mL (ref 0.35–4.50)

## 2015-01-26 MED ORDER — LEVOFLOXACIN 500 MG PO TABS: 500.0000 mg | ORAL_TABLET | Freq: Every day | ORAL | Status: DC

## 2015-01-26 NOTE — Progress Notes (Signed)
Pre visit review using our clinic review tool, if applicable. No additional management support is needed unless otherwise documented below in the visit note. 

## 2015-01-26 NOTE — Progress Notes (Signed)
   Subjective:    Patient ID: April [name], female    DOB: 01/23/1929, 81 y.o.   MRN: [id number]  HPI Here for 2 things. First she has had a week of sinus pressure, PND, ST, and a dry cough. Also for several months she has felt very fatigued and she wonders if she is anemic again. She has also had some elevated glucose readings and diabetes does run in her family.    Review of Systems  Constitutional: Positive for fatigue. Negative for fever and diaphoresis.  HENT: Positive for congestion, postnasal drip and sinus pressure.   Eyes: Negative.   Respiratory: Positive for cough.   Gastrointestinal: Negative.   Endocrine: Negative.   Genitourinary: Negative.        Objective:   Physical Exam  Constitutional: She appears well-developed and well-nourished.  HENT:  Right Ear: External ear normal.  Left Ear: External ear normal.  Nose: Nose normal.  Mouth/Throat: Oropharynx is clear and moist.  Eyes: Conjunctivae are normal.  Cardiovascular: Normal rate, regular rhythm, normal heart sounds and intact distal pulses.   Pulmonary/Chest: Effort normal and breath sounds normal.  Lymphadenopathy:    She has no cervical adenopathy.          Assessment & Plan:  Treat with Levaquin. Get labs today

## 2015-01-27 ENCOUNTER — Telehealth: Payer: Self-pay | Admitting: Family Medicine

## 2015-01-27 NOTE — Telephone Encounter (Signed)
emmi mailed  °

## 2015-02-04 DIAGNOSIS — M25512 Pain in left shoulder: Secondary | ICD-10-CM | POA: Diagnosis not present

## 2015-02-04 DIAGNOSIS — M1711 Unilateral primary osteoarthritis, right knee: Secondary | ICD-10-CM | POA: Diagnosis not present

## 2015-02-11 DIAGNOSIS — Z961 Presence of intraocular lens: Secondary | ICD-10-CM | POA: Diagnosis not present

## 2015-02-11 DIAGNOSIS — H1859 Other hereditary corneal dystrophies: Secondary | ICD-10-CM | POA: Diagnosis not present

## 2015-02-17 DIAGNOSIS — M1711 Unilateral primary osteoarthritis, right knee: Secondary | ICD-10-CM | POA: Diagnosis not present

## 2015-02-18 ENCOUNTER — Ambulatory Visit (INDEPENDENT_AMBULATORY_CARE_PROVIDER_SITE_OTHER): Payer: Medicare Other | Admitting: Family Medicine

## 2015-02-18 ENCOUNTER — Encounter: Payer: Self-pay | Admitting: Family Medicine

## 2015-02-18 VITALS — BP 128/80 | Temp 98.5°F | Ht 62.0 in | Wt 176.6 lb

## 2015-02-18 DIAGNOSIS — Z01818 Encounter for other preprocedural examination: Secondary | ICD-10-CM | POA: Diagnosis not present

## 2015-02-18 NOTE — Progress Notes (Signed)
   Subjective:    Patient ID: April [name], female    DOB: September 06, 1929, 81 y.o.   MRN: [id number]  HPI Here for a preoperative evaluation per Dr. French Ana. She is scheduled for a right TKA on 03-27-15. Other than her knee pain she is doing well and has no complaints.    Review of Systems  Constitutional: Negative.   Respiratory: Negative.   Cardiovascular: Negative.   Gastrointestinal: Negative.   Genitourinary: Negative.   Neurological: Negative.        Objective:   Physical Exam  Constitutional: She is oriented to person, place, and time. She appears well-developed and well-nourished.  Walks with a cane   Neck: No thyromegaly present.  Cardiovascular: Normal rate, regular rhythm, normal heart sounds and intact distal pulses.   EKG normal   Pulmonary/Chest: Effort normal and breath sounds normal.  Lymphadenopathy:    She has no cervical adenopathy.  Neurological: She is alert and oriented to person, place, and time.          Assessment & Plan:  She is cleared for surgery.

## 2015-03-02 DIAGNOSIS — D0511 Intraductal carcinoma in situ of right breast: Secondary | ICD-10-CM | POA: Diagnosis not present

## 2015-03-02 DIAGNOSIS — C50912 Malignant neoplasm of unspecified site of left female breast: Secondary | ICD-10-CM | POA: Diagnosis not present

## 2015-03-03 ENCOUNTER — Telehealth: Payer: Self-pay | Admitting: Family Medicine

## 2015-03-03 ENCOUNTER — Ambulatory Visit: Payer: Medicare Other | Admitting: Internal Medicine

## 2015-03-03 MED ORDER — BENZONATATE 200 MG PO CAPS: 200.0000 mg | ORAL_CAPSULE | Freq: Two times a day (BID) | ORAL | Status: DC | PRN

## 2015-03-03 MED ORDER — LEVOFLOXACIN 500 MG PO TABS: 500.0000 mg | ORAL_TABLET | Freq: Two times a day (BID) | ORAL | Status: DC

## 2015-03-03 NOTE — Telephone Encounter (Signed)
Call in Levaquin 500 mg bid for 10 days, also Benzonatate 200 mg to take bid prn cough, #60 with 2 rf

## 2015-03-03 NOTE — Telephone Encounter (Signed)
I sent both scripts e-scribe and spoke with pt. 

## 2015-03-03 NOTE — Telephone Encounter (Signed)
Pt started with a deep cough on Sunday 03/01/15, no fever. Says that usually she does start on antibiotic for this and cough medicine. Send scripts to [facility].

## 2015-03-06 ENCOUNTER — Ambulatory Visit (INDEPENDENT_AMBULATORY_CARE_PROVIDER_SITE_OTHER): Payer: Medicare Other | Admitting: Family Medicine

## 2015-03-06 ENCOUNTER — Encounter: Payer: Self-pay | Admitting: Family Medicine

## 2015-03-06 VITALS — BP 116/91 | HR 77 | Temp 98.9°F | Ht 62.0 in | Wt 176.0 lb

## 2015-03-06 DIAGNOSIS — J209 Acute bronchitis, unspecified: Secondary | ICD-10-CM | POA: Diagnosis not present

## 2015-03-06 MED ORDER — LEVOFLOXACIN 500 MG PO TABS: 500.0000 mg | ORAL_TABLET | Freq: Every day | ORAL | Status: DC

## 2015-03-06 MED ORDER — HYDROCODONE-HOMATROPINE 5-1.5 MG/5ML PO SYRP: 5.0000 mL | ORAL_SOLUTION | ORAL | Status: DC | PRN

## 2015-03-06 NOTE — Progress Notes (Signed)
   Subjective:    Patient ID: April [name], female    DOB: [birth year], 81 y.o.   MRN: [id number]  HPI Here for one week of chest tightness and a dry cough. She has taken Levaquin for 3 days but is about the same today.    Review of Systems  Constitutional: Negative.   HENT: Negative.   Eyes: Negative.   Respiratory: Positive for cough. Negative for wheezing.        Objective:   Physical Exam  Constitutional: She appears well-developed and well-nourished.  HENT:  Right Ear: External ear normal.  Left Ear: External ear normal.  Nose: Nose normal.  Mouth/Throat: Oropharynx is clear and moist.  Eyes: Conjunctivae are normal.  Pulmonary/Chest: Effort normal and breath sounds normal. No respiratory distress. She has no wheezes. She has no rales.  Lymphadenopathy:    She has no cervical adenopathy.          Assessment & Plan:  She will stay on Levaquin but I advised her to decrease this to once a day. Add Hydromet syrup.

## 2015-03-06 NOTE — Progress Notes (Signed)
Pre visit review using our clinic review tool, if applicable. No additional management support is needed unless otherwise documented below in the visit note. 

## 2015-03-10 DIAGNOSIS — M1711 Unilateral primary osteoarthritis, right knee: Secondary | ICD-10-CM | POA: Diagnosis not present

## 2015-03-11 ENCOUNTER — Ambulatory Visit: Payer: Self-pay | Admitting: Physician Assistant

## 2015-03-11 ENCOUNTER — Telehealth: Payer: Self-pay | Admitting: Family Medicine

## 2015-03-11 DIAGNOSIS — M1711 Unilateral primary osteoarthritis, right knee: Secondary | ICD-10-CM | POA: Insufficient documentation

## 2015-03-11 NOTE — H&P (Signed)
TOTAL KNEE ADMISSION H&P  Patient is being admitted for right total knee arthroplasty.  Subjective:  Chief Complaint:right knee pain.  HPI: April [name], 81 y.o. female, has a history of pain and functional disability in the right knee due to arthritis and has failed non-surgical conservative treatments for greater than 12 weeks to includeNSAID's and/or analgesics, corticosteriod injections, use of assistive devices and activity modification.  Onset of symptoms was gradual, starting >10 years ago with gradually worsening course since that time. The patient noted no past surgery on the right knee(s).  Patient currently rates pain in the right knee(s) at 10 out of 10 with activity. Patient has night pain, worsening of pain with activity and weight bearing, pain that interferes with activities of daily living, pain with passive range of motion, crepitus and joint swelling.  Patient has evidence of subchondral sclerosis, periarticular osteophytes and joint space narrowing by imaging studies.There is no active infection.  Patient Active Problem List   Diagnosis Date Noted  . Primary localized osteoarthritis of right knee 03/11/2015  . Insomnia 07/24/2014  . 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 Dysuria 01/27/2012  . Breast cancer   . Hx of radiation therapy   . ROTATOR CUFF SYNDROME 05/06/2010  . Essential hypertension 09/30/2009  . LOW BACK PAIN SYNDROME 07/13/2009  . BURSITIS 01/04/2008  . COSTOCHONDRITIS 11/05/2007  . ALLERGIC RHINITIS 04/18/2007  . GERD 04/18/2007   Past Medical History  Diagnosis Date  . HYPERTENSION 09/30/2009  . GERD 04/18/2007  . LOW BACK PAIN SYNDROME 07/13/2009  . ROTATOR CUFF SYNDROME 05/06/2010  . BURSITIS 01/04/2008  . COSTOCHONDRITIS 11/05/2007  . ALLERGIC RHINITIS 04/18/2007  . Melanoma     removed from upper back  . Breast cancer 2012     Dr. [name], ER/PR positive L breast  . Hx of radiation therapy 10/26/11 to 12/16/11    L chest wall, L supraclavicular region  .  Adenomatous colon polyp 11/2005  . Hiatal hernia   . Internal hemorrhoids   . Anemia   . Arthritis   . History of pneumonia   . History of gallstones     Past Surgical History  Procedure Laterality Date  . Tonsillectomy and adenoidectomy    . Breast surgery  1968    left bx - noncancerous  . Joint replacement      left knee  . Excision of back melanoma    . Mastectomy  08/25/11    bilateral , per Dr. Donne Hazel  . Sentinel lymph node biopsy  08/25/11    bilateral  . Cholecystectomy  1979  . Ankle surgery      right  . Cataract extraction      bilateral     (Not in a hospital admission) Allergies  Allergen Reactions  . Cefaclor Hives and Itching    Purple splotches  . Lansoprazole Hives and Itching    Purple splotches, hurting  . Penicillins Hives and Itching    Purple splotches  . Sulfa Drugs Cross Reactors Other (See Comments)    Purple whelps, itch, "just go crazy"    History  Substance Use Topics  . Smoking status: Never Smoker   . Smokeless tobacco: Never Used  . Alcohol Use: 0.6 oz/week    1 Standard drinks or equivalent per week     Comment: socially    Family History  Problem Relation Age of Onset  . Stroke Father   . Alcohol abuse Sister   . Thyroid cancer Mother   . Retinal  detachment Mother   . Diabetes Paternal Uncle   . Cirrhosis Sister      Review of Systems  Constitutional: Negative.   HENT: Positive for tinnitus. Negative for congestion, ear discharge, ear pain, hearing loss and nosebleeds.   Eyes: Negative.   Respiratory: Positive for cough. Negative for hemoptysis, shortness of breath, wheezing and stridor.   Cardiovascular: Negative.   Gastrointestinal: Positive for nausea, vomiting and constipation. Negative for heartburn, abdominal pain, diarrhea, blood in stool and melena.  Genitourinary: Positive for frequency. Negative for dysuria, urgency, hematuria and flank pain.  Musculoskeletal: Positive for joint pain. Negative for falls.   Skin: Negative.   Neurological: Positive for dizziness. Negative for tingling, tremors, sensory change, speech change, focal weakness, seizures, loss of consciousness and headaches.  Endo/Heme/Allergies: Negative for environmental allergies and polydipsia. Bruises/bleeds easily.  Psychiatric/Behavioral: Negative for depression. The patient is nervous/anxious.     Objective:  Physical Exam  Constitutional: She is oriented to person, place, and time. She appears well-developed and well-nourished. No distress.  HENT:  Head: Normocephalic and atraumatic.  Nose: Nose normal.  Eyes: Conjunctivae and EOM are normal. Pupils are equal, round, and reactive to light.  Neck: Normal range of motion. Neck supple.  Cardiovascular: Normal rate, regular rhythm and intact distal pulses.   Murmur heard. Respiratory: Effort normal. No respiratory distress. She has no wheezes. She has no rales. She exhibits no tenderness.  GI: Soft. Bowel sounds are normal. She exhibits no distension. There is no tenderness.  Musculoskeletal:       Right knee: She exhibits decreased range of motion, swelling, deformity, abnormal alignment and bony tenderness. She exhibits no effusion, no laceration, no erythema and no LCL laxity. Tenderness found. Medial joint line and lateral joint line tenderness noted.  Lymphadenopathy:    She has no cervical adenopathy.  Neurological: She is alert and oriented to person, place, and time. No cranial nerve deficit.  Skin: Skin is warm and dry. No rash noted. No erythema.  Psychiatric: She has a normal mood and affect. Her behavior is normal.    Vital signs in last 24 hours: @VSRANGES @  Labs:   Estimated body mass index is 32.18 kg/(m^2) as calculated from the following:   Height as of 03/06/15: 5\' 2"  (1.575 m).   Weight as of 03/06/15: 79.833 kg (176 lb).   Imaging Review Plain radiographs demonstrate severe degenerative joint disease of the right knee(s). The overall alignment  issignificant valgus. The bone quality appears to be fair for age and reported activity level.  Assessment/Plan:  End stage arthritis, right knee   The patient history, physical examination, clinical judgment of the provider and imaging studies are consistent with end stage degenerative joint disease of the right knee(s) and total knee arthroplasty is deemed medically necessary. The treatment options including medical management, injection therapy arthroscopy and arthroplasty were discussed at length. The risks and benefits of total knee arthroplasty were presented and reviewed. The risks due to aseptic loosening, infection, stiffness, patella tracking problems, thromboembolic complications and other imponderables were discussed. The patient acknowledged the explanation, agreed to proceed with the plan and consent was signed. Patient is being admitted for inpatient treatment for surgery, pain control, PT, OT, prophylactic antibiotics, VTE prophylaxis, progressive ambulation and ADL's and discharge planning. The patient is planning to be discharged home with home health services as long as her son or daughter are able to be there with her otherwise SNF as she lives alone husband is in skilled nursing facility for  advanced dementia.

## 2015-03-11 NOTE — Telephone Encounter (Signed)
Patient Name: April [name]  DOB: 04-Sep-1929    Initial Comment Caller states she has bronchitis and she has been taking medication for it and the medicine is making her dizzy and sleepy.    Nurse Assessment  Nurse: Melina Modena, RN, Estill Bamberg Date/Time Eilene Ghazi Time): 03/11/2015 4:06:13 PM  Confirm and document reason for call. If symptomatic, describe symptoms. ---Caller states she has bronchitis and she has been taking medication (cough medicine) for it and the medicine is making her dizzy and sleepy. pt states med helped the coughing, but she had to stop taking it due to the dizziness; pt started med a couple days ago and dizziness started the same day as when she started the med  Has the patient traveled out of the country within the last 30 days? ---Not Applicable  Does the patient require triage? ---Yes  Related visit to physician within the last 2 weeks? ---Yes  Does the PT have any chronic conditions? (i.e. diabetes, asthma, etc.) ---No     Guidelines    Guideline Title Affirmed Question Affirmed Notes  Dizziness - Lightheadedness Taking a medicine that could cause dizziness (e.g., blood pressure medications, diuretics)    Final Disposition User   See Physician within 24 Hours Drain, RN, Estill Bamberg    Comments  Appt scheduled for 03/13/15 at 2:30pm with Dr. Sarajane Jews

## 2015-03-12 NOTE — Pre-Procedure Instructions (Signed)
BAYLEN DEA  03/12/2015   Your procedure is scheduled on:  Friday, April 29.  Report to [facility] Admitting at 5:30 AM.                        Call this number if you have problems the morning of surgery: [phone]               For any other questions, please call [phone], Monday - Friday 8 AM - 4 PM.   Remember:   Do not eat food or drink liquids after midnight Thursday, April 28.   Take these medicines the morning of surgery with A SIP OF WATER: omeprazole (PRILOSEC).                  May use inhaler and please bring it to the hospital with you.                Take if needed: Cough Medication                April 22: STOP taking Aspirin and Advil (Ibuprofen)   Do not wear jewelry, make-up or nail polish.  Do not wear lotions, powders, or perfumes. DO Not wear deodorant.  Do not shave 48 hours prior to surgery.   Do not bring valuables to the hospital.              [facility] is not responsible  for any belongings or valuables.               Contacts, dentures or bridgework may not be worn into surgery.  Leave suitcase in the car. After surgery it may be brought to your room.  For patients admitted to the hospital, discharge time is determined by your treatment team.                 Special Instructions: -   Please read over the following fact sheets that you were given: Pain Booklet, Coughing and Deep Breathing, Blood Transfusion Information and Surgical Site Infection Prevention and Incentive Spirometery

## 2015-03-13 ENCOUNTER — Encounter (HOSPITAL_COMMUNITY)
Admission: RE | Admit: 2015-03-13 | Discharge: 2015-03-13 | Disposition: A | Discharge status: Home / Self Care | Payer: Medicare Other | Source: Ambulatory Visit | Attending: Orthopedic Surgery | Admitting: Orthopedic Surgery

## 2015-03-13 ENCOUNTER — Ambulatory Visit: Payer: Self-pay | Admitting: Family Medicine

## 2015-03-13 ENCOUNTER — Ambulatory Visit (HOSPITAL_COMMUNITY)
Admission: RE | Admit: 2015-03-13 | Discharge: 2015-03-13 | Disposition: A | Discharge status: Home / Self Care | Payer: Medicare Other | Source: Ambulatory Visit | Attending: Physician Assistant | Admitting: Physician Assistant

## 2015-03-13 ENCOUNTER — Encounter (HOSPITAL_COMMUNITY): Payer: Self-pay

## 2015-03-13 DIAGNOSIS — I10 Essential (primary) hypertension: Secondary | ICD-10-CM | POA: Diagnosis not present

## 2015-03-13 DIAGNOSIS — K219 Gastro-esophageal reflux disease without esophagitis: Secondary | ICD-10-CM | POA: Diagnosis not present

## 2015-03-13 DIAGNOSIS — Z01818 Encounter for other preprocedural examination: Secondary | ICD-10-CM | POA: Diagnosis not present

## 2015-03-13 DIAGNOSIS — K449 Diaphragmatic hernia without obstruction or gangrene: Secondary | ICD-10-CM | POA: Insufficient documentation

## 2015-03-13 DIAGNOSIS — M1711 Unilateral primary osteoarthritis, right knee: Secondary | ICD-10-CM

## 2015-03-13 HISTORY — DX: Pneumonia, unspecified organism: J18.9

## 2015-03-13 HISTORY — DX: Hypothyroidism, unspecified: E03.9

## 2015-03-13 HISTORY — DX: Bronchitis, not specified as acute or chronic: J40

## 2015-03-13 LAB — URINALYSIS, ROUTINE W REFLEX MICROSCOPIC
BILIRUBIN URINE: NEGATIVE
Glucose, UA: NEGATIVE mg/dL
HGB URINE DIPSTICK: NEGATIVE
Ketones, ur: NEGATIVE mg/dL
Leukocytes, UA: NEGATIVE
Nitrite: NEGATIVE
Protein, ur: NEGATIVE mg/dL
SPECIFIC GRAVITY, URINE: 1.007 (ref 1.005–1.030)
Urobilinogen, UA: 0.2 mg/dL (ref 0.0–1.0)
pH: 6.5 (ref 5.0–8.0)

## 2015-03-13 LAB — APTT: APTT: 31 s (ref 24–37)

## 2015-03-13 LAB — TYPE AND SCREEN
ABO/RH(D): A POS
Antibody Screen: NEGATIVE

## 2015-03-13 LAB — SURGICAL PCR SCREEN
MRSA, PCR: NEGATIVE
Staphylococcus aureus: NEGATIVE

## 2015-03-13 LAB — CBC WITH DIFFERENTIAL/PLATELET
Basophils Absolute: 0 10*3/uL (ref 0.0–0.1)
Basophils Relative: 0 % (ref 0–1)
Eosinophils Absolute: 0.1 10*3/uL (ref 0.0–0.7)
Eosinophils Relative: 1 % (ref 0–5)
HEMATOCRIT: 39.1 % (ref 36.0–46.0)
HEMOGLOBIN: 12.8 g/dL (ref 12.0–15.0)
Lymphocytes Relative: 28 % (ref 12–46)
Lymphs Abs: 2.3 10*3/uL (ref 0.7–4.0)
MCH: 28.2 pg (ref 26.0–34.0)
MCHC: 32.7 g/dL (ref 30.0–36.0)
MCV: 86.1 fL (ref 78.0–100.0)
MONO ABS: 0.8 10*3/uL (ref 0.1–1.0)
MONOS PCT: 10 % (ref 3–12)
NEUTROS ABS: 5.1 10*3/uL (ref 1.7–7.7)
Neutrophils Relative %: 61 % (ref 43–77)
Platelets: 231 10*3/uL (ref 150–400)
RBC: 4.54 MIL/uL (ref 3.87–5.11)
RDW: 14.9 % (ref 11.5–15.5)
WBC: 8.3 10*3/uL (ref 4.0–10.5)

## 2015-03-13 LAB — COMPREHENSIVE METABOLIC PANEL
ALT: 14 U/L (ref 0–35)
AST: 13 U/L (ref 0–37)
Albumin: 3.6 g/dL (ref 3.5–5.2)
Alkaline Phosphatase: 60 U/L (ref 39–117)
Anion gap: 9 (ref 5–15)
BUN: 22 mg/dL (ref 6–23)
CHLORIDE: 103 mmol/L (ref 96–112)
CO2: 25 mmol/L (ref 19–32)
Calcium: 9.7 mg/dL (ref 8.4–10.5)
Creatinine, Ser: 1.2 mg/dL — ABNORMAL HIGH (ref 0.50–1.10)
GFR, EST AFRICAN AMERICAN: 47 mL/min — AB (ref >90)
GFR, EST NON AFRICAN AMERICAN: 41 mL/min — AB (ref >90)
GLUCOSE: 93 mg/dL (ref 70–99)
Potassium: 4.2 mmol/L (ref 3.5–5.1)
Sodium: 137 mmol/L (ref 135–145)
Total Bilirubin: 0.4 mg/dL (ref 0.3–1.2)
Total Protein: 6.6 g/dL (ref 6.0–8.3)

## 2015-03-13 LAB — ABO/RH: ABO/RH(D): A POS

## 2015-03-13 LAB — PROTIME-INR
INR: 1.07 (ref 0.00–1.49)
Prothrombin Time: 14 seconds (ref 11.6–15.2)

## 2015-03-13 NOTE — Progress Notes (Addendum)
Denies any current cardiac problems.  Had come into the ER 12/26/2013 with chest pain.  (Note in Epic by Dr. Derl Barrow). She saw him 01/2014.   Currently taking Levaquin for bronchitis.  Afebrile at PAT, but is starting to cough up sputum.  DA

## 2015-03-19 ENCOUNTER — Telehealth: Payer: Self-pay | Admitting: Family Medicine

## 2015-03-19 MED ORDER — DOXYCYCLINE HYCLATE 100 MG PO TABS: 100.0000 mg | ORAL_TABLET | Freq: Two times a day (BID) | ORAL | Status: DC

## 2015-03-19 NOTE — Telephone Encounter (Signed)
Call in Doxycycline 100 mg bid for 10 days  

## 2015-03-19 NOTE — Telephone Encounter (Signed)
I spoke with pt, called in script and they will deliver today.

## 2015-03-19 NOTE — Telephone Encounter (Signed)
Pt was seen on 4/8 for bronchitis. Pt states she still has bronchitis. Pt would like to know if md will call meds into [facility] please call  meds  into pharm by 10 am for delivery.

## 2015-03-26 MED ORDER — TRANEXAMIC ACID 1000 MG/10ML IV SOLN
1000.0000 mg | INTRAVENOUS | Status: AC
  Administered 2015-03-27: 1000 mg via INTRAVENOUS
  Filled 2015-03-26: qty 10

## 2015-03-27 ENCOUNTER — Encounter (HOSPITAL_COMMUNITY): Payer: Self-pay | Admitting: Surgery

## 2015-03-27 ENCOUNTER — Inpatient Hospital Stay (HOSPITAL_COMMUNITY)
Admission: RE | Admit: 2015-03-27 | Discharge: 2015-03-30 | DRG: 470 | Disposition: A | Discharge status: Skilled Nursing Facility | Payer: Medicare Other | Source: Ambulatory Visit | Attending: Orthopedic Surgery | Admitting: Orthopedic Surgery

## 2015-03-27 ENCOUNTER — Inpatient Hospital Stay (HOSPITAL_COMMUNITY): Payer: Medicare Other | Admitting: Certified Registered Nurse Anesthetist

## 2015-03-27 ENCOUNTER — Encounter (HOSPITAL_COMMUNITY)
Admission: RE | Disposition: A | Discharge status: Skilled Nursing Facility | Payer: Self-pay | Source: Ambulatory Visit | Attending: Orthopedic Surgery

## 2015-03-27 DIAGNOSIS — K563 Gallstone ileus: Secondary | ICD-10-CM | POA: Diagnosis not present

## 2015-03-27 DIAGNOSIS — Z833 Family history of diabetes mellitus: Secondary | ICD-10-CM

## 2015-03-27 DIAGNOSIS — M6281 Muscle weakness (generalized): Secondary | ICD-10-CM | POA: Diagnosis not present

## 2015-03-27 DIAGNOSIS — K219 Gastro-esophageal reflux disease without esophagitis: Secondary | ICD-10-CM | POA: Diagnosis present

## 2015-03-27 DIAGNOSIS — Z888 Allergy status to other drugs, medicaments and biological substances status: Secondary | ICD-10-CM | POA: Diagnosis not present

## 2015-03-27 DIAGNOSIS — R52 Pain, unspecified: Secondary | ICD-10-CM | POA: Diagnosis not present

## 2015-03-27 DIAGNOSIS — Z811 Family history of alcohol abuse and dependence: Secondary | ICD-10-CM | POA: Diagnosis not present

## 2015-03-27 DIAGNOSIS — G8918 Other acute postprocedural pain: Secondary | ICD-10-CM | POA: Diagnosis not present

## 2015-03-27 DIAGNOSIS — Z471 Aftercare following joint replacement surgery: Secondary | ICD-10-CM | POA: Diagnosis not present

## 2015-03-27 DIAGNOSIS — Z923 Personal history of irradiation: Secondary | ICD-10-CM | POA: Diagnosis not present

## 2015-03-27 DIAGNOSIS — Z853 Personal history of malignant neoplasm of breast: Secondary | ICD-10-CM | POA: Diagnosis not present

## 2015-03-27 DIAGNOSIS — D649 Anemia, unspecified: Secondary | ICD-10-CM | POA: Diagnosis not present

## 2015-03-27 DIAGNOSIS — Z96652 Presence of left artificial knee joint: Secondary | ICD-10-CM | POA: Diagnosis present

## 2015-03-27 DIAGNOSIS — Z880 Allergy status to penicillin: Secondary | ICD-10-CM

## 2015-03-27 DIAGNOSIS — R278 Other lack of coordination: Secondary | ICD-10-CM | POA: Diagnosis not present

## 2015-03-27 DIAGNOSIS — K210 Gastro-esophageal reflux disease with esophagitis: Secondary | ICD-10-CM | POA: Diagnosis not present

## 2015-03-27 DIAGNOSIS — Z96651 Presence of right artificial knee joint: Secondary | ICD-10-CM | POA: Diagnosis not present

## 2015-03-27 DIAGNOSIS — M1990 Unspecified osteoarthritis, unspecified site: Secondary | ICD-10-CM | POA: Diagnosis not present

## 2015-03-27 DIAGNOSIS — Z882 Allergy status to sulfonamides status: Secondary | ICD-10-CM

## 2015-03-27 DIAGNOSIS — Z823 Family history of stroke: Secondary | ICD-10-CM

## 2015-03-27 DIAGNOSIS — M1711 Unilateral primary osteoarthritis, right knee: Principal | ICD-10-CM | POA: Diagnosis present

## 2015-03-27 DIAGNOSIS — R2681 Unsteadiness on feet: Secondary | ICD-10-CM | POA: Diagnosis not present

## 2015-03-27 DIAGNOSIS — Z7902 Long term (current) use of antithrombotics/antiplatelets: Secondary | ICD-10-CM

## 2015-03-27 DIAGNOSIS — M179 Osteoarthritis of knee, unspecified: Secondary | ICD-10-CM | POA: Diagnosis not present

## 2015-03-27 DIAGNOSIS — Z9013 Acquired absence of bilateral breasts and nipples: Secondary | ICD-10-CM | POA: Diagnosis present

## 2015-03-27 DIAGNOSIS — Z79899 Other long term (current) drug therapy: Secondary | ICD-10-CM

## 2015-03-27 DIAGNOSIS — I10 Essential (primary) hypertension: Secondary | ICD-10-CM | POA: Diagnosis present

## 2015-03-27 DIAGNOSIS — K5710 Diverticulosis of small intestine without perforation or abscess without bleeding: Secondary | ICD-10-CM | POA: Diagnosis not present

## 2015-03-27 DIAGNOSIS — E039 Hypothyroidism, unspecified: Secondary | ICD-10-CM | POA: Diagnosis not present

## 2015-03-27 DIAGNOSIS — M25561 Pain in right knee: Secondary | ICD-10-CM | POA: Diagnosis not present

## 2015-03-27 HISTORY — PX: TOTAL KNEE ARTHROPLASTY: SHX125

## 2015-03-27 SURGERY — ARTHROPLASTY, KNEE, TOTAL
Anesthesia: Regional | Site: Knee | Laterality: Right

## 2015-03-27 MED ORDER — OMEPRAZOLE 20 MG PO CPDR
40.0000 mg | DELAYED_RELEASE_CAPSULE | Freq: Two times a day (BID) | ORAL | Status: DC
  Administered 2015-03-28 – 2015-03-30 (×4): 40 mg via ORAL
  Filled 2015-03-27 (×8): qty 2

## 2015-03-27 MED ORDER — LACTATED RINGERS IV SOLN
INTRAVENOUS | Status: DC | PRN
  Administered 2015-03-27 (×2): via INTRAVENOUS

## 2015-03-27 MED ORDER — PROMETHAZINE HCL 25 MG/ML IJ SOLN: 6.2500 mg | INTRAMUSCULAR | Status: DC | PRN

## 2015-03-27 MED ORDER — POLYETHYLENE GLYCOL 3350 17 G PO PACK: 17.0000 g | PACK | Freq: Every day | ORAL | Status: DC | PRN

## 2015-03-27 MED ORDER — PHENYLEPHRINE 40 MCG/ML (10ML) SYRINGE FOR IV PUSH (FOR BLOOD PRESSURE SUPPORT)
PREFILLED_SYRINGE | INTRAVENOUS | Status: AC
  Filled 2015-03-27: qty 20

## 2015-03-27 MED ORDER — PHENOL 1.4 % MT LIQD: 1.0000 | OROMUCOSAL | Status: DC | PRN

## 2015-03-27 MED ORDER — HYDROMORPHONE HCL 1 MG/ML IJ SOLN
INTRAMUSCULAR | Status: AC
  Administered 2015-03-27: 0.5 mg via INTRAVENOUS
  Filled 2015-03-27: qty 1

## 2015-03-27 MED ORDER — SODIUM CHLORIDE 0.9 % IV SOLN
INTRAVENOUS | Status: DC
  Administered 2015-03-27: 12:00:00 via INTRAVENOUS

## 2015-03-27 MED ORDER — POLYVINYL ALCOHOL 1.4 % OP SOLN
1.0000 [drp] | Freq: Three times a day (TID) | OPHTHALMIC | Status: DC
  Administered 2015-03-27 – 2015-03-30 (×8): 1 [drp] via OPHTHALMIC
  Filled 2015-03-27: qty 15

## 2015-03-27 MED ORDER — MEPERIDINE HCL 25 MG/ML IJ SOLN: 6.2500 mg | INTRAMUSCULAR | Status: DC | PRN

## 2015-03-27 MED ORDER — VANCOMYCIN HCL IN DEXTROSE 1-5 GM/200ML-% IV SOLN
1000.0000 mg | INTRAVENOUS | Status: AC
  Administered 2015-03-27: 1000 mg via INTRAVENOUS
  Filled 2015-03-27: qty 200

## 2015-03-27 MED ORDER — PROPOFOL INFUSION 10 MG/ML OPTIME
INTRAVENOUS | Status: DC | PRN
  Administered 2015-03-27: 25 ug/kg/min via INTRAVENOUS
  Administered 2015-03-27: 20 ug/kg/min via INTRAVENOUS

## 2015-03-27 MED ORDER — METOCLOPRAMIDE HCL 5 MG PO TABS
5.0000 mg | ORAL_TABLET | Freq: Three times a day (TID) | ORAL | Status: DC | PRN
Start: 2015-03-27 — End: 2015-03-30

## 2015-03-27 MED ORDER — FLEET ENEMA 7-19 GM/118ML RE ENEM: 1.0000 | ENEMA | Freq: Once | RECTAL | Status: AC | PRN

## 2015-03-27 MED ORDER — MIDAZOLAM HCL 5 MG/5ML IJ SOLN
INTRAMUSCULAR | Status: DC | PRN
  Administered 2015-03-27 (×4): 0.5 mg via INTRAVENOUS

## 2015-03-27 MED ORDER — SODIUM CHLORIDE 0.9 % IV SOLN: INTRAVENOUS | Status: DC

## 2015-03-27 MED ORDER — BUPIVACAINE HCL (PF) 0.5 % IJ SOLN
INTRAMUSCULAR | Status: DC | PRN
  Administered 2015-03-27: 3 mL

## 2015-03-27 MED ORDER — ACETAMINOPHEN 325 MG PO TABS
650.0000 mg | ORAL_TABLET | Freq: Four times a day (QID) | ORAL | Status: DC | PRN
  Administered 2015-03-28 – 2015-03-29 (×2): 650 mg via ORAL
  Filled 2015-03-27 (×3): qty 2

## 2015-03-27 MED ORDER — ONDANSETRON HCL 4 MG/2ML IJ SOLN
INTRAMUSCULAR | Status: AC
  Administered 2015-03-27: 4 mg via INTRAVENOUS
  Filled 2015-03-27: qty 2

## 2015-03-27 MED ORDER — HYDROMORPHONE HCL 1 MG/ML IJ SOLN
0.5000 mg | INTRAMUSCULAR | Status: DC | PRN
  Administered 2015-03-27 – 2015-03-28 (×7): 0.5 mg via INTRAVENOUS
  Filled 2015-03-27 (×7): qty 1

## 2015-03-27 MED ORDER — PROPOFOL 10 MG/ML IV BOLUS
INTRAVENOUS | Status: AC
  Filled 2015-03-27: qty 20

## 2015-03-27 MED ORDER — ONDANSETRON HCL 4 MG PO TABS: 4.0000 mg | ORAL_TABLET | Freq: Four times a day (QID) | ORAL | Status: DC | PRN

## 2015-03-27 MED ORDER — ACETAMINOPHEN 650 MG RE SUPP: 650.0000 mg | Freq: Four times a day (QID) | RECTAL | Status: DC | PRN

## 2015-03-27 MED ORDER — ALBUTEROL SULFATE HFA 108 (90 BASE) MCG/ACT IN AERS: 2.0000 | INHALATION_SPRAY | RESPIRATORY_TRACT | Status: DC | PRN

## 2015-03-27 MED ORDER — BUPIVACAINE-EPINEPHRINE (PF) 0.5% -1:200000 IJ SOLN
INTRAMUSCULAR | Status: AC
  Filled 2015-03-27: qty 30

## 2015-03-27 MED ORDER — NEOSTIGMINE METHYLSULFATE 10 MG/10ML IV SOLN
INTRAVENOUS | Status: AC
  Filled 2015-03-27: qty 1

## 2015-03-27 MED ORDER — HYPROMELLOSE (GONIOSCOPIC) 2.5 % OP SOLN
1.0000 [drp] | OPHTHALMIC | Status: DC | PRN
  Filled 2015-03-27: qty 15

## 2015-03-27 MED ORDER — GLYCOPYRROLATE 0.2 MG/ML IJ SOLN
INTRAMUSCULAR | Status: AC
  Filled 2015-03-27: qty 3

## 2015-03-27 MED ORDER — VANCOMYCIN HCL IN DEXTROSE 1-5 GM/200ML-% IV SOLN
1000.0000 mg | Freq: Two times a day (BID) | INTRAVENOUS | Status: AC
  Administered 2015-03-28: 1000 mg via INTRAVENOUS
  Filled 2015-03-27: qty 200

## 2015-03-27 MED ORDER — POLYETHYL GLYCOL-PROPYL GLYCOL 0.4-0.3 % OP SOLN: 1.0000 [drp] | Freq: Three times a day (TID) | OPHTHALMIC | Status: DC

## 2015-03-27 MED ORDER — DEXAMETHASONE SODIUM PHOSPHATE 4 MG/ML IJ SOLN
INTRAMUSCULAR | Status: AC
  Filled 2015-03-27: qty 1

## 2015-03-27 MED ORDER — BUPIVACAINE-EPINEPHRINE (PF) 0.5% -1:200000 IJ SOLN
INTRAMUSCULAR | Status: DC | PRN
  Administered 2015-03-27: 20 mL via PERINEURAL

## 2015-03-27 MED ORDER — ALBUTEROL SULFATE (2.5 MG/3ML) 0.083% IN NEBU: 2.5000 mg | INHALATION_SOLUTION | RESPIRATORY_TRACT | Status: DC | PRN

## 2015-03-27 MED ORDER — CHLORHEXIDINE GLUCONATE 4 % EX LIQD
60.0000 mL | Freq: Once | CUTANEOUS | Status: DC
  Filled 2015-03-27: qty 60

## 2015-03-27 MED ORDER — BENZONATATE 100 MG PO CAPS: 200.0000 mg | ORAL_CAPSULE | Freq: Two times a day (BID) | ORAL | Status: DC | PRN

## 2015-03-27 MED ORDER — PROPOFOL 10 MG/ML IV BOLUS
INTRAVENOUS | Status: DC | PRN
  Administered 2015-03-27: 20 mg via INTRAVENOUS

## 2015-03-27 MED ORDER — ONDANSETRON HCL 4 MG/2ML IJ SOLN
4.0000 mg | Freq: Four times a day (QID) | INTRAMUSCULAR | Status: DC | PRN
  Administered 2015-03-27: 4 mg via INTRAVENOUS
  Filled 2015-03-27: qty 2

## 2015-03-27 MED ORDER — VANCOMYCIN HCL 1000 MG IV SOLR: 1000.0000 mg | INTRAVENOUS | Status: DC | PRN

## 2015-03-27 MED ORDER — OXYCODONE HCL 5 MG PO TABS
5.0000 mg | ORAL_TABLET | ORAL | Status: DC | PRN
  Administered 2015-03-27 – 2015-03-28 (×7): 10 mg via ORAL
  Administered 2015-03-29 (×2): 5 mg via ORAL
  Administered 2015-03-29: 10 mg via ORAL
  Administered 2015-03-29 – 2015-03-30 (×2): 5 mg via ORAL
  Administered 2015-03-30: 10 mg via ORAL
  Filled 2015-03-27 (×2): qty 2
  Filled 2015-03-27: qty 1
  Filled 2015-03-27 (×4): qty 2
  Filled 2015-03-27 (×2): qty 1
  Filled 2015-03-27: qty 2
  Filled 2015-03-27: qty 1
  Filled 2015-03-27 (×2): qty 2

## 2015-03-27 MED ORDER — NON FORMULARY: 40.0000 mg | Freq: Two times a day (BID) | Status: DC

## 2015-03-27 MED ORDER — FENTANYL CITRATE (PF) 100 MCG/2ML IJ SOLN
INTRAMUSCULAR | Status: DC | PRN
  Administered 2015-03-27 (×2): 25 ug via INTRAVENOUS

## 2015-03-27 MED ORDER — BISACODYL 5 MG PO TBEC: 5.0000 mg | DELAYED_RELEASE_TABLET | Freq: Every day | ORAL | Status: DC | PRN

## 2015-03-27 MED ORDER — ROCURONIUM BROMIDE 50 MG/5ML IV SOLN
INTRAVENOUS | Status: AC
  Filled 2015-03-27: qty 1

## 2015-03-27 MED ORDER — APIXABAN 2.5 MG PO TABS
2.5000 mg | ORAL_TABLET | Freq: Two times a day (BID) | ORAL | Status: DC
  Administered 2015-03-28 – 2015-03-30 (×5): 2.5 mg via ORAL
  Filled 2015-03-27 (×5): qty 1

## 2015-03-27 MED ORDER — SODIUM CHLORIDE 0.9 % IR SOLN
Status: DC | PRN
  Administered 2015-03-27: 1000 mL

## 2015-03-27 MED ORDER — HYDROMORPHONE HCL 1 MG/ML IJ SOLN
0.2500 mg | INTRAMUSCULAR | Status: DC | PRN
  Administered 2015-03-27: 0.5 mg via INTRAVENOUS
  Administered 2015-03-27 (×2): 0.25 mg via INTRAVENOUS

## 2015-03-27 MED ORDER — DOCUSATE SODIUM 100 MG PO CAPS
100.0000 mg | ORAL_CAPSULE | Freq: Two times a day (BID) | ORAL | Status: DC
  Administered 2015-03-28 – 2015-03-30 (×5): 100 mg via ORAL
  Filled 2015-03-27 (×6): qty 1

## 2015-03-27 MED ORDER — METOCLOPRAMIDE HCL 5 MG/ML IJ SOLN
5.0000 mg | Freq: Three times a day (TID) | INTRAMUSCULAR | Status: DC | PRN
Start: 2015-03-27 — End: 2015-03-30

## 2015-03-27 MED ORDER — ACETAMINOPHEN 10 MG/ML IV SOLN
1000.0000 mg | Freq: Once | INTRAVENOUS | Status: AC
  Administered 2015-03-27: 1000 mg via INTRAVENOUS

## 2015-03-27 MED ORDER — MIDAZOLAM HCL 2 MG/2ML IJ SOLN
INTRAMUSCULAR | Status: AC
  Filled 2015-03-27: qty 2

## 2015-03-27 MED ORDER — ACETAMINOPHEN 10 MG/ML IV SOLN
INTRAVENOUS | Status: AC
  Administered 2015-03-27: 1000 mg via INTRAVENOUS
  Filled 2015-03-27: qty 100

## 2015-03-27 MED ORDER — MENTHOL 3 MG MT LOZG: 1.0000 | LOZENGE | OROMUCOSAL | Status: DC | PRN

## 2015-03-27 MED ORDER — LISINOPRIL 20 MG PO TABS
20.0000 mg | ORAL_TABLET | Freq: Every day | ORAL | Status: DC
  Administered 2015-03-28 – 2015-03-30 (×3): 20 mg via ORAL
  Filled 2015-03-27 (×4): qty 1

## 2015-03-27 MED ORDER — SUCCINYLCHOLINE CHLORIDE 20 MG/ML IJ SOLN
INTRAMUSCULAR | Status: AC
  Filled 2015-03-27: qty 1

## 2015-03-27 MED ORDER — FENTANYL CITRATE (PF) 250 MCG/5ML IJ SOLN
INTRAMUSCULAR | Status: AC
  Filled 2015-03-27: qty 5

## 2015-03-27 MED ORDER — BUPIVACAINE-EPINEPHRINE 0.5% -1:200000 IJ SOLN
INTRAMUSCULAR | Status: DC | PRN
  Administered 2015-03-27: 2 mL

## 2015-03-27 SURGICAL SUPPLY — 64 items
BANDAGE ELASTIC 4 VELCRO ST LF (GAUZE/BANDAGES/DRESSINGS) ×3 IMPLANT
BANDAGE ELASTIC 6 VELCRO ST LF (GAUZE/BANDAGES/DRESSINGS) ×3 IMPLANT
BANDAGE ESMARK 6X9 LF (GAUZE/BANDAGES/DRESSINGS) ×1 IMPLANT
BLADE SAGITTAL 25.0X1.19X90 (BLADE) ×2 IMPLANT
BLADE SAGITTAL 25.0X1.19X90MM (BLADE) ×1
BLADE SAW SAG 90X13X1.27 (BLADE) ×3 IMPLANT
BNDG ESMARK 6X9 LF (GAUZE/BANDAGES/DRESSINGS) ×3
BONE CEMENT GENTAMICIN (Cement) ×6 IMPLANT
BOWL SMART MIX CTS (DISPOSABLE) ×3 IMPLANT
CAP KNEE TOTAL 3 SIGMA ×3 IMPLANT
CEMENT BONE GENTAMICIN 40 (Cement) ×2 IMPLANT
COVER SURGICAL LIGHT HANDLE (MISCELLANEOUS) ×3 IMPLANT
CUFF TOURNIQUET SINGLE 34IN LL (TOURNIQUET CUFF) ×3 IMPLANT
CUFF TOURNIQUET SINGLE 44IN (TOURNIQUET CUFF) IMPLANT
DRAPE IMP U-DRAPE 54X76 (DRAPES) ×3 IMPLANT
DRAPE INCISE IOBAN 66X45 STRL (DRAPES) ×3 IMPLANT
DRAPE ORTHO SPLIT 77X108 STRL (DRAPES) ×4
DRAPE SURG ORHT 6 SPLT 77X108 (DRAPES) ×2 IMPLANT
DRAPE U-SHAPE 47X51 STRL (DRAPES) ×3 IMPLANT
DRSG ADAPTIC 3X8 NADH LF (GAUZE/BANDAGES/DRESSINGS) ×3 IMPLANT
DRSG PAD ABDOMINAL 8X10 ST (GAUZE/BANDAGES/DRESSINGS) ×3 IMPLANT
DURAPREP 26ML APPLICATOR (WOUND CARE) ×3 IMPLANT
ELECT REM PT RETURN 9FT ADLT (ELECTROSURGICAL) ×3
ELECTRODE REM PT RTRN 9FT ADLT (ELECTROSURGICAL) ×1 IMPLANT
EVACUATOR 1/8 PVC DRAIN (DRAIN) ×3 IMPLANT
FACESHIELD WRAPAROUND (MASK) ×6 IMPLANT
FLOSEAL 10ML (HEMOSTASIS) IMPLANT
GAUZE SPONGE 4X4 12PLY STRL (GAUZE/BANDAGES/DRESSINGS) ×3 IMPLANT
GLOVE BIOGEL PI IND STRL 8 (GLOVE) ×4 IMPLANT
GLOVE BIOGEL PI INDICATOR 8 (GLOVE) ×8
GLOVE ORTHO TXT STRL SZ7.5 (GLOVE) ×9 IMPLANT
GLOVE SURG ORTHO 8.0 STRL STRW (GLOVE) ×9 IMPLANT
GOWN STRL REUS W/ TWL LRG LVL3 (GOWN DISPOSABLE) ×2 IMPLANT
GOWN STRL REUS W/ TWL XL LVL3 (GOWN DISPOSABLE) ×1 IMPLANT
GOWN STRL REUS W/TWL 2XL LVL3 (GOWN DISPOSABLE) ×3 IMPLANT
GOWN STRL REUS W/TWL LRG LVL3 (GOWN DISPOSABLE) ×4
GOWN STRL REUS W/TWL XL LVL3 (GOWN DISPOSABLE) ×2
HANDPIECE INTERPULSE COAX TIP (DISPOSABLE) ×2
HOOD PEEL AWAY FACE SHEILD DIS (HOOD) ×3 IMPLANT
IMMOBILIZER KNEE 22 UNIV (SOFTGOODS) ×3 IMPLANT
KIT BASIN OR (CUSTOM PROCEDURE TRAY) ×3 IMPLANT
KIT ROOM TURNOVER OR (KITS) ×3 IMPLANT
MANIFOLD NEPTUNE II (INSTRUMENTS) ×3 IMPLANT
NEEDLE 22X1 1/2 (OR ONLY) (NEEDLE) ×3 IMPLANT
NS IRRIG 1000ML POUR BTL (IV SOLUTION) ×3 IMPLANT
PACK TOTAL JOINT (CUSTOM PROCEDURE TRAY) ×3 IMPLANT
PACK UNIVERSAL I (CUSTOM PROCEDURE TRAY) ×3 IMPLANT
PAD ARMBOARD 7.5X6 YLW CONV (MISCELLANEOUS) ×6 IMPLANT
PAD CAST 4YDX4 CTTN HI CHSV (CAST SUPPLIES) ×1 IMPLANT
PADDING CAST COTTON 4X4 STRL (CAST SUPPLIES) ×2
PADDING CAST COTTON 6X4 STRL (CAST SUPPLIES) ×3 IMPLANT
SET HNDPC FAN SPRY TIP SCT (DISPOSABLE) ×1 IMPLANT
STAPLER VISISTAT 35W (STAPLE) ×3 IMPLANT
SUCTION FRAZIER TIP 10 FR DISP (SUCTIONS) ×3 IMPLANT
SUT ETHIBOND NAB CT1 #1 30IN (SUTURE) ×9 IMPLANT
SUT VIC AB 0 CT1 27 (SUTURE) ×2
SUT VIC AB 0 CT1 27XBRD ANBCTR (SUTURE) ×1 IMPLANT
SUT VIC AB 2-0 CT1 27 (SUTURE) ×4
SUT VIC AB 2-0 CT1 TAPERPNT 27 (SUTURE) ×2 IMPLANT
SYR CONTROL 10ML LL (SYRINGE) ×3 IMPLANT
TOWEL OR 17X24 6PK STRL BLUE (TOWEL DISPOSABLE) ×3 IMPLANT
TOWEL OR 17X26 10 PK STRL BLUE (TOWEL DISPOSABLE) ×3 IMPLANT
TRAY FOLEY CATH 16FRSI W/METER (SET/KITS/TRAYS/PACK) IMPLANT
WATER STERILE IRR 1000ML POUR (IV SOLUTION) ×6 IMPLANT

## 2015-03-27 NOTE — Interval H&P Note (Signed)
History and Physical Interval Note:  03/27/2015 7:26 AM  [place]  has presented today for surgery, with the diagnosis of OA RIGHT KNEE  The various methods of treatment have been discussed with the patient and family. After consideration of risks, benefits and other options for treatment, the patient has consented to  Procedure(s): TOTAL KNEE ARTHROPLASTY (Right) as a surgical intervention .  The patient's history has been reviewed, patient examined, no change in status, stable for surgery.  I have reviewed the patient's chart and labs.  Questions were answered to the patient's satisfaction.     Emori Kamau JR,W D

## 2015-03-27 NOTE — Progress Notes (Signed)
Orthopedic Tech Progress Note Patient Details:  April [name] 10/17/1929 [id number] Patient could not tolerate CPM at 90 degrees. CPM settings changed to 60 for comfort. Footsie roll provided. CPM Right Knee CPM Right Knee: On Right Knee Flexion (Degrees): 60 Right Knee Extension (Degrees): 0   Asia R Thompson 03/27/2015, 12:48 PM

## 2015-03-27 NOTE — Brief Op Note (Signed)
03/27/2015  10:37 AM  PATIENT:  April [name]  81 y.o. female  PRE-OPERATIVE DIAGNOSIS:  OA RIGHT KNEE  POST-OPERATIVE DIAGNOSIS:  OA RIGHT KNEE  PROCEDURE:  Procedure(s): TOTAL KNEE ARTHROPLASTY (Right)  SURGEON:  Surgeon(s) and Role:    * [name], MD - Primary  PHYSICIAN ASSISTANT: Chriss Czar, PA-C  ASSISTANTS:   ANESTHESIA:   local, regional, spinal and IV sedation  EBL:  Total I/O In: 1800 [I.V.:1800] Out: 300 [Urine:250; Blood:50]  BLOOD ADMINISTERED:none  DRAINS: 1 hemovac drain lateral right knee self suction   LOCAL MEDICATIONS USED:  MARCAINE     SPECIMEN:  No Specimen  DISPOSITION OF SPECIMEN:  N/A  COUNTS:  YES  TOURNIQUET:   Total Tourniquet Time Documented: Thigh (Right) - 87 minutes Total: Thigh (Right) - 87 minutes   DICTATION: .Other Dictation: Dictation Number unknown  PLAN OF CARE: Admit to inpatient   PATIENT DISPOSITION:  PACU - hemodynamically stable.   Delay start of Pharmacological VTE agent (>24hrs) due to surgical blood loss or risk of bleeding: yes

## 2015-03-27 NOTE — Progress Notes (Signed)
Utilization review completed.  

## 2015-03-27 NOTE — Anesthesia Procedure Notes (Addendum)
Procedure Name: MAC Date/Time: 03/27/2015 7:40 AM Performed by: Gershon Mussel, VEDWATTIE Pre-anesthesia Checklist: Patient identified, Emergency Drugs available, Suction available, Timeout performed and Patient being monitored Patient Re-evaluated:Patient Re-evaluated prior to inductionOxygen Delivery Method: Nasal Cannula   Anesthesia Regional Block:  Supraclavicular block  Pre-Anesthetic Checklist: ,, timeout performed, Correct Patient, Correct Site, Correct Laterality, Correct Procedure, Correct Position, site marked, Risks and benefits discussed, Surgical consent,  Pre-op evaluation,  Post-op pain management  Laterality: Right  Prep: chloraprep       Needles:  Injection technique: Single-shot  Needle Type: Stimulator Needle - 80     Needle Length: 9cm 9 cm Needle Gauge: 22 and 22 G  Needle insertion depth: 6 cm   Additional Needles:  Procedures: ultrasound guided (picture in chart) and nerve stimulator Supraclavicular block  Nerve Stimulator or Paresthesia:  Response: Patellar snap, 0.5 mA,   Additional Responses:   Narrative:  Injection made incrementally with aspirations every 5 mL.  Performed by: Personally  Anesthesiologist: Nolon Nations  Additional Notes: BP cuff, EKG monitors applied. Sedation begun. Femoral artery palpated for location of nerve. After nerve location verified with U/S, anesthetic injected incrementally, slowly, and after negative aspirations under direct u/s guidance. Good perineural spread. Patient tolerated well.   Spinal Patient location during procedure: OR Staffing Anesthesiologist: Nolon Nations Performed by: anesthesiologist  Preanesthetic Checklist Completed: patient identified, site marked, surgical consent, pre-op evaluation, timeout performed, IV checked, risks and benefits discussed and monitors and equipment checked Spinal Block Patient position: sitting Prep: ChloraPrep Patient monitoring: heart rate, continuous pulse ox and  blood pressure Approach: right paramedian Location: L3-4 Injection technique: single-shot Needle Needle type: Spinocan  Needle gauge: 22 G Needle length: 9 cm Assessment Sensory level: T8 Additional Notes Expiration date of kit checked and confirmed. Patient tolerated procedure well, without complications.

## 2015-03-27 NOTE — Evaluation (Signed)
Physical Therapy Evaluation Patient Details Name: April [name] MRN: [id number] DOB: [birth year] Today's Date: 03/27/2015   History of Present Illness  81 y.o. female with history of breast cancer, s/p Rt TKA.  Clinical Impression  Pt is s/p Rt TKA presenting with the deficits listed below (see PT Problem List). Motivated to improve and work with therapy. Pt required min assist for safe stand pivot transfer to chair post op day #0. Tolerated therapeutic exercises and discussed importance of compliance, especially with use of zero knee. Pt nauseous at end of therapy session, RN notified. Pt will benefit from skilled PT to increase their independence and safety with mobility to allow discharge to the venue listed below.      Follow Up Recommendations Home health PT;Supervision/Assistance - 24 hour    Equipment Recommendations  3in1 (PT)    Recommendations for Other Services OT consult     Precautions / Restrictions Precautions Precautions: Knee Precaution Booklet Issued: Yes (comment) Precaution Comments: Reviewed Required Braces or Orthoses: Knee Immobilizer - Right Knee Immobilizer - Right: On when out of bed or walking Restrictions Weight Bearing Restrictions: Yes RLE Weight Bearing: Weight bearing as tolerated      Mobility  Bed Mobility Overal bed mobility: Needs Assistance Bed Mobility: Supine to Sit     Supine to sit: Min assist;HOB elevated     General bed mobility comments: Min assist to lower RLE to floor only. VC for technique to maneuver to EOB.  Transfers Overall transfer level: Needs assistance Equipment used: Rolling walker (2 wheeled) Transfers: Sit to/from [facility] Sit to Stand: Min assist Stand pivot transfers: Min assist       General transfer comment: Min assist for boost to stand. VC for hand placement and to place RLE under base of support upon standing. Tolerated lateral weight shifting with cues for Rt knee extension for quad  activation. Min assist for walker control with pivot transfer to chair. Cues to increase WB as able through RLE.  Ambulation/Gait                Stairs            Wheelchair Mobility    Modified Rankin (Stroke Patients Only)       Balance Overall balance assessment: Needs assistance Sitting-balance support: No upper extremity supported;Feet supported Sitting balance-Leahy Scale: Fair     Standing balance support: Single extremity supported Standing balance-Leahy Scale: Poor                               Pertinent Vitals/Pain Pain Assessment: 0-10 Pain Score: 5  Pain Location: Rt knee Pain Descriptors / Indicators: Throbbing Pain Intervention(s): Monitored during session;Repositioned;Premedicated before session    [place] expects to be discharged to:: Private residence Living Arrangements: Alone Available Help at Discharge: Family;Available 24 hours/day Type of Home: House Home Access: Stairs to enter Entrance Stairs-Rails: None Entrance Stairs-Number of Steps: 1 Home Layout: Two level;Able to live on main level with bedroom/bathroom Home Equipment: Gilford Rile - 2 wheels;Cane - quad;Tub bench      Prior Function Level of Independence: Independent with assistive device(s)         Comments: using cane for mobility. Ind with ADLs     Hand Dominance   Dominant Hand: Left    Extremity/Trunk Assessment   Upper Extremity Assessment: Defer to OT evaluation           Lower Extremity  Assessment: RLE deficits/detail RLE Deficits / Details: Decreased strength and ROM as expected post-op. Unable to lift fully to extension against gravity (knee extension)       Communication   Communication: No difficulties  Cognition Arousal/Alertness: Awake/alert Behavior During Therapy: WFL for tasks assessed/performed Overall Cognitive Status: Within Functional Limits for tasks assessed                      General  Comments General comments (skin integrity, edema, etc.): Pt nauseous with dry heaving but no emesis at end of therapy session. RN notifed, pt request anti-nausea medication.    Exercises Total Joint Exercises Ankle Circles/Pumps: AROM;Both;10 reps;Seated Quad Sets: AROM;Right;10 reps;Seated Long Arc Quad: AAROM;Right;10 reps;Seated Knee Flexion: AAROM;Right;10 reps;Seated      Assessment/Plan    PT Assessment Patient needs continued PT services  PT Diagnosis Difficulty walking;Generalized weakness;Acute pain   PT Problem List Decreased strength;Decreased range of motion;Decreased activity tolerance;Decreased balance;Decreased mobility;Decreased knowledge of use of DME;Decreased knowledge of precautions;Pain  PT Treatment Interventions DME instruction;Gait training;Stair training;Functional mobility training;Therapeutic activities;Therapeutic exercise;Balance training;Neuromuscular re-education;Patient/family education;Modalities   PT Goals (Current goals can be found in the Care Plan section) Acute Rehab PT Goals Patient Stated Goal: No more pain PT Goal Formulation: With patient Time For Goal Achievement: 04/03/15 Potential to Achieve Goals: Good    Frequency 7X/week   Barriers to discharge        Co-evaluation               End of Session Equipment Utilized During Treatment: Gait belt;Right knee immobilizer Activity Tolerance: Patient limited by pain;Other (comment) (Became nauseous) Patient left: in chair;with call bell/phone within reach;Other (comment) (zero knee in place) Nurse Communication: Mobility status;Other (comment) (Pt nauseous, requests anit-nausea medicine)         Time: 2902-1115 PT Time Calculation (min) (ACUTE ONLY): 35 min   Charges:   PT Evaluation $Initial PT Evaluation Tier I: 1 Procedure PT Treatments $Therapeutic Activity: 8-22 mins   PT G CodesEllouise Newer 03/27/2015, 6:18 PM Camille Bal [place], [place]

## 2015-03-27 NOTE — H&P (View-Only) (Signed)
TOTAL KNEE ADMISSION H&P  Patient is being admitted for right total knee arthroplasty.  Subjective:  Chief Complaint:right knee pain.  HPI: April [name], 81 y.o. female, has a history of pain and functional disability in the right knee due to arthritis and has failed non-surgical conservative treatments for greater than 12 weeks to includeNSAID's and/or analgesics, corticosteriod injections, use of assistive devices and activity modification.  Onset of symptoms was gradual, starting >10 years ago with gradually worsening course since that time. The patient noted no past surgery on the right knee(s).  Patient currently rates pain in the right knee(s) at 10 out of 10 with activity. Patient has night pain, worsening of pain with activity and weight bearing, pain that interferes with activities of daily living, pain with passive range of motion, crepitus and joint swelling.  Patient has evidence of subchondral sclerosis, periarticular osteophytes and joint space narrowing by imaging studies.There is no active infection.  Patient Active Problem List   Diagnosis Date Noted  . Primary localized osteoarthritis of right knee 03/11/2015  . Insomnia 07/24/2014  . 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 Dysuria 01/27/2012  . Breast cancer   . Hx of radiation therapy   . ROTATOR CUFF SYNDROME 05/06/2010  . Essential hypertension 09/30/2009  . LOW BACK PAIN SYNDROME 07/13/2009  . BURSITIS 01/04/2008  . COSTOCHONDRITIS 11/05/2007  . ALLERGIC RHINITIS 04/18/2007  . GERD 04/18/2007   Past Medical History  Diagnosis Date  . HYPERTENSION 09/30/2009  . GERD 04/18/2007  . LOW BACK PAIN SYNDROME 07/13/2009  . ROTATOR CUFF SYNDROME 05/06/2010  . BURSITIS 01/04/2008  . COSTOCHONDRITIS 11/05/2007  . ALLERGIC RHINITIS 04/18/2007  . Melanoma     removed from upper back  . Breast cancer 2012     Dr. [name], ER/PR positive L breast  . Hx of radiation therapy 10/26/11 to 12/16/11    L chest wall, L supraclavicular region  .  Adenomatous colon polyp 11/2005  . Hiatal hernia   . Internal hemorrhoids   . Anemia   . Arthritis   . History of pneumonia   . History of gallstones     Past Surgical History  Procedure Laterality Date  . Tonsillectomy and adenoidectomy    . Breast surgery  1968    left bx - noncancerous  . Joint replacement      left knee  . Excision of back melanoma    . Mastectomy  08/25/11    bilateral , per Dr. Donne Hazel  . Sentinel lymph node biopsy  08/25/11    bilateral  . Cholecystectomy  1979  . Ankle surgery      right  . Cataract extraction      bilateral     (Not in a hospital admission) Allergies  Allergen Reactions  . Cefaclor Hives and Itching    Purple splotches  . Lansoprazole Hives and Itching    Purple splotches, hurting  . Penicillins Hives and Itching    Purple splotches  . Sulfa Drugs Cross Reactors Other (See Comments)    Purple whelps, itch, "just go crazy"    History  Substance Use Topics  . Smoking status: Never Smoker   . Smokeless tobacco: Never Used  . Alcohol Use: 0.6 oz/week    1 Standard drinks or equivalent per week     Comment: socially    Family History  Problem Relation Age of Onset  . Stroke Father   . Alcohol abuse Sister   . Thyroid cancer Mother   . Retinal  detachment Mother   . Diabetes Paternal Uncle   . Cirrhosis Sister      Review of Systems  Constitutional: Negative.   HENT: Positive for tinnitus. Negative for congestion, ear discharge, ear pain, hearing loss and nosebleeds.   Eyes: Negative.   Respiratory: Positive for cough. Negative for hemoptysis, shortness of breath, wheezing and stridor.   Cardiovascular: Negative.   Gastrointestinal: Positive for nausea, vomiting and constipation. Negative for heartburn, abdominal pain, diarrhea, blood in stool and melena.  Genitourinary: Positive for frequency. Negative for dysuria, urgency, hematuria and flank pain.  Musculoskeletal: Positive for joint pain. Negative for falls.   Skin: Negative.   Neurological: Positive for dizziness. Negative for tingling, tremors, sensory change, speech change, focal weakness, seizures, loss of consciousness and headaches.  Endo/Heme/Allergies: Negative for environmental allergies and polydipsia. Bruises/bleeds easily.  Psychiatric/Behavioral: Negative for depression. The patient is nervous/anxious.     Objective:  Physical Exam  Constitutional: She is oriented to person, place, and time. She appears well-developed and well-nourished. No distress.  HENT:  Head: Normocephalic and atraumatic.  Nose: Nose normal.  Eyes: Conjunctivae and EOM are normal. Pupils are equal, round, and reactive to light.  Neck: Normal range of motion. Neck supple.  Cardiovascular: Normal rate, regular rhythm and intact distal pulses.   Murmur heard. Respiratory: Effort normal. No respiratory distress. She has no wheezes. She has no rales. She exhibits no tenderness.  GI: Soft. Bowel sounds are normal. She exhibits no distension. There is no tenderness.  Musculoskeletal:       Right knee: She exhibits decreased range of motion, swelling, deformity, abnormal alignment and bony tenderness. She exhibits no effusion, no laceration, no erythema and no LCL laxity. Tenderness found. Medial joint line and lateral joint line tenderness noted.  Lymphadenopathy:    She has no cervical adenopathy.  Neurological: She is alert and oriented to person, place, and time. No cranial nerve deficit.  Skin: Skin is warm and dry. No rash noted. No erythema.  Psychiatric: She has a normal mood and affect. Her behavior is normal.    Vital signs in last 24 hours: @VSRANGES @  Labs:   Estimated body mass index is 32.18 kg/(m^2) as calculated from the following:   Height as of 03/06/15: 5\' 2"  (1.575 m).   Weight as of 03/06/15: 79.833 kg (176 lb).   Imaging Review Plain radiographs demonstrate severe degenerative joint disease of the right knee(s). The overall alignment  issignificant valgus. The bone quality appears to be fair for age and reported activity level.  Assessment/Plan:  End stage arthritis, right knee   The patient history, physical examination, clinical judgment of the provider and imaging studies are consistent with end stage degenerative joint disease of the right knee(s) and total knee arthroplasty is deemed medically necessary. The treatment options including medical management, injection therapy arthroscopy and arthroplasty were discussed at length. The risks and benefits of total knee arthroplasty were presented and reviewed. The risks due to aseptic loosening, infection, stiffness, patella tracking problems, thromboembolic complications and other imponderables were discussed. The patient acknowledged the explanation, agreed to proceed with the plan and consent was signed. Patient is being admitted for inpatient treatment for surgery, pain control, PT, OT, prophylactic antibiotics, VTE prophylaxis, progressive ambulation and ADL's and discharge planning. The patient is planning to be discharged home with home health services as long as her son or daughter are able to be there with her otherwise SNF as she lives alone husband is in skilled nursing facility for  advanced dementia.

## 2015-03-27 NOTE — Anesthesia Preprocedure Evaluation (Addendum)
Anesthesia Evaluation  Patient identified by MRN, date of birth, ID band Patient awake    Reviewed: Allergy & Precautions, NPO status , Patient's Chart, lab work & pertinent test results  Airway Mallampati: II  TM Distance: >3 FB Neck ROM: Full    Dental no notable dental hx.    Pulmonary neg pulmonary ROS, pneumonia -,  breath sounds clear to auscultation  Pulmonary exam normal       Cardiovascular hypertension, Pt. on medications negative cardio ROS  Rhythm:Regular Rate:Normal     Neuro/Psych negative neurological ROS  negative psych ROS   GI/Hepatic Neg liver ROS, hiatal hernia, GERD-  Medicated,  Endo/Other  negative endocrine ROSHypothyroidism   Renal/GU negative Renal ROS     Musculoskeletal  (+) Arthritis -,   Abdominal   Peds  Hematology negative hematology ROS (+) anemia ,   Anesthesia Other Findings   Reproductive/Obstetrics negative OB ROS                            Anesthesia Physical Anesthesia Plan  ASA: III  Anesthesia Plan: Spinal and Regional   Post-op Pain Management: MAC Combined w/ Regional for Post-op pain   Induction:   Airway Management Planned:   Additional Equipment:   Intra-op Plan:   Post-operative Plan:   Informed Consent: I have reviewed the patients History and Physical, chart, labs and discussed the procedure including the risks, benefits and alternatives for the proposed anesthesia with the patient or authorized representative who has indicated his/her understanding and acceptance.   Dental advisory given  Plan Discussed with: CRNA  Anesthesia Plan Comments:        Anesthesia Quick Evaluation

## 2015-03-27 NOTE — Care Management Note (Signed)
CARE MANAGEMENT NOTE 03/27/2015  Patient:  April [name], April [name]   Account Number:  [id number]  Date Initiated:  03/27/2015  Documentation initiated by:  Ricki Miller  Subjective/Objective Assessment:   81 yr old female admitted with osteoarthritis of right knee. Patient underwent a right total knee arthroplasty.     Action/Plan:   PT/OT eval  Patient preoperatively setup with [facility].  CM will continue to monitor.   Anticipated DC Date:     Anticipated DC Plan:           Choice offered to / List presented to:     DME arranged  [facility]      DME agency  [facility]   Status of service:  In process, will continue to follow

## 2015-03-27 NOTE — Transfer of Care (Signed)
Immediate Anesthesia Transfer of Care Note  Patient: April [name]  Procedure(s) Performed: Procedure(s): TOTAL KNEE ARTHROPLASTY (Right)  Patient Location: PACU  Anesthesia Type:Spinal  Level of Consciousness: awake, alert , oriented and patient cooperative  Airway & Oxygen Therapy: Patient Spontanous Breathing and Patient connected to nasal cannula oxygen  Post-op Assessment: Report given to RN, Post -op Vital signs reviewed and stable and Patient moving all extremities X 4  Post vital signs: Reviewed and stable  Last Vitals:  Filed Vitals:   03/27/15 0605  BP: 149/69  Pulse: 68  Temp: 37.1 C  Resp: 18    Complications: No apparent anesthesia complications

## 2015-03-28 LAB — CBC
HCT: 31.7 % — ABNORMAL LOW (ref 36.0–46.0)
Hemoglobin: 10.3 g/dL — ABNORMAL LOW (ref 12.0–15.0)
MCH: 28.5 pg (ref 26.0–34.0)
MCHC: 32.5 g/dL (ref 30.0–36.0)
MCV: 87.6 fL (ref 78.0–100.0)
Platelets: 188 10*3/uL (ref 150–400)
RBC: 3.62 MIL/uL — AB (ref 3.87–5.11)
RDW: 15.1 % (ref 11.5–15.5)
WBC: 9.5 10*3/uL (ref 4.0–10.5)

## 2015-03-28 LAB — BASIC METABOLIC PANEL
ANION GAP: 6 (ref 5–15)
BUN: 13 mg/dL (ref 6–23)
CHLORIDE: 102 mmol/L (ref 96–112)
CO2: 26 mmol/L (ref 19–32)
Calcium: 8.6 mg/dL (ref 8.4–10.5)
Creatinine, Ser: 0.83 mg/dL (ref 0.50–1.10)
GFR calc Af Amer: 74 mL/min — ABNORMAL LOW (ref >90)
GFR calc non Af Amer: 63 mL/min — ABNORMAL LOW (ref >90)
Glucose, Bld: 127 mg/dL — ABNORMAL HIGH (ref 70–99)
POTASSIUM: 4.3 mmol/L (ref 3.5–5.1)
Sodium: 134 mmol/L — ABNORMAL LOW (ref 135–145)

## 2015-03-28 MED ORDER — APIXABAN 2.5 MG PO TABS: 2.5000 mg | ORAL_TABLET | Freq: Two times a day (BID) | ORAL | Status: DC

## 2015-03-28 MED ORDER — OXYCODONE HCL 5 MG PO TABS: 5.0000 mg | ORAL_TABLET | ORAL | Status: DC | PRN

## 2015-03-28 NOTE — Evaluation (Signed)
Occupational Therapy Evaluation Patient Details Name: April [name] MRN: [id number] DOB: 1929/09/23 Today's Date: 03/28/2015    History of Present Illness 81 y.o. female with history of breast cancer, s/p Rt TKA.   Clinical Impression   This 81 yo female admitted and underwent above presents to acute OT with increased pain, decreased mobility, decreased balance all affecting her ability to take care of herself at home at a Mod I to independent level as she was pta. She will benefit from acute OT with follow up [facility].    Follow Up Recommendations  Home health OT    Equipment Recommendations  3 in 1 bedside comode (if daughter does not find that she as one at home)       Precautions / Restrictions Precautions Precautions: Knee Required Braces or Orthoses: Knee Immobilizer - Right Knee Immobilizer - Right: On when out of bed or walking Restrictions Weight Bearing Restrictions: No RLE Weight Bearing: Weight bearing as tolerated      Mobility Bed Mobility               General bed mobility comments: Pt up in recliner upon my arrival  Transfers Overall transfer level: Needs assistance Equipment used: Rolling walker (2 wheeled) Transfers: Sit to/from [facility] Sit to Stand: Min assist Stand pivot transfers: Min assist (mainly putting weight on LLE)            Balance Overall balance assessment: Needs assistance Sitting-balance support: No upper extremity supported;Feet supported Sitting balance-Leahy Scale: Fair     Standing balance support: Bilateral upper extremity supported Standing balance-Leahy Scale: Poor                              ADL Overall ADL's : Needs assistance/impaired Eating/Feeding: Independent;Sitting   Grooming: Set up;Sitting   Upper Body Bathing: Set up;Sitting   Lower Body Bathing: Moderate assistance (with min A sit<>stand)   Upper Body Dressing : Set up;Sitting   Lower Body Dressing: Maximal  assistance (with min A sit<>stand)   Toilet Transfer: Minimal assistance;Stand-pivot;RW;BSC (mainly pivoting on LLE)   Toileting- Clothing Manipulation and Hygiene: Minimal assistance (with min A sit<>stand)               Vision Additional Comments: No change from baseline          Pertinent Vitals/Pain Pain Assessment: 0-10 Pain Score: 8  Pain Location: right knee Pain Descriptors / Indicators: Aching;Sore Pain Intervention(s): Monitored during session;Repositioned     Hand Dominance Left   Extremity/Trunk Assessment Upper Extremity Assessment Upper Extremity Assessment: Overall WFL for tasks assessed           Communication Communication Communication: No difficulties   Cognition Arousal/Alertness: Awake/alert Behavior During Therapy: WFL for tasks assessed/performed Overall Cognitive Status: Within Functional Limits for tasks assessed                                Home Living Family/patient expects to be discharged to:: Private residence Living Arrangements: Alone Available Help at Discharge: Family;Available 24 hours/day (first week, maybe second week (son may be only able to come and stay second part of second week)) Type of Home: House Home Access: Stairs to enter [employer] of Steps: 1 Entrance Stairs-Rails: None Home Layout: Two level;Able to live on main level with bedroom/bathroom     Bathroom Shower/Tub: Tub/shower unit;Curtain Shower/tub characteristics: Curtain  Home Equipment: [place] - 2 wheels;Cane - quad;Tub bench (thinks she may have a 3n1--will have daughter check to see if it is in the attic)          Prior Functioning/Environment Level of Independence: Independent with assistive device(s)        Comments: using cane for mobility. Ind with ADLs    OT Diagnosis: Generalized weakness;Acute pain   OT Problem List: Decreased strength;Decreased range of motion;Decreased activity tolerance;Impaired  balance (sitting and/or standing);Pain;Obesity;Decreased knowledge of use of DME or AE   OT Treatment/Interventions: Self-care/ADL training;Patient/family education;Balance training;Therapeutic activities;DME and/or AE instruction    OT Goals(Current goals can be found in the care plan section) Acute Rehab OT Goals Patient Stated Goal: decreased pain OT Goal Formulation: With patient Time For Goal Achievement: 04/04/15 Potential to Achieve Goals: Good  OT Frequency: Min 2X/week              End of Session Equipment Utilized During Treatment: Gait belt;Rolling walker  Activity Tolerance: Patient limited by pain Patient left: in chair;with call bell/phone within reach   Time: 1046-1103 OT Time Calculation (min): 17 min Charges:  OT General Charges $OT Visit: 1 Procedure OT Evaluation $Initial OT Evaluation Tier I: 1 Procedure OT Treatments $Self Care/Home Management : 8-22 mins  Almon Register [phone] 03/28/2015, 11:15 AM

## 2015-03-28 NOTE — Discharge Instructions (Signed)
INSTRUCTIONS AFTER JOINT REPLACEMENT   o Remove items at home which could result in a fall. This includes throw rugs or furniture in walking pathways o ICE to the affected joint every three hours while awake for 30 minutes at a time, for at least the first 3-5 days, and then as needed for pain and swelling.  Continue to use ice for pain and swelling. You may notice swelling that will progress down to the foot and ankle.  This is normal after surgery.  Elevate your leg when you are not up walking on it.   o Continue to use the breathing machine you got in the hospital (incentive spirometer) which will help keep your temperature down.  It is common for your temperature to cycle up and down following surgery, especially at night when you are not up moving around and exerting yourself.  The breathing machine keeps your lungs expanded and your temperature down.   DIET:  As you were doing prior to hospitalization, we recommend a well-balanced diet.  DRESSING / WOUND CARE / SHOWERING  You may change your dressing 3-5 days after surgery.  Then change the dressing every day with sterile gauze.  Please use good hand washing techniques before changing the dressing.  Do not use any lotions or creams on the incision until instructed by your surgeon.  ACTIVITY  o Increase activity slowly as tolerated, but follow the weight bearing instructions below.   o No driving for 6 weeks or until further direction given by your physician.  You cannot drive while taking narcotics.  o No lifting or carrying greater than 10 lbs. until further directed by your surgeon. o Avoid periods of inactivity such as sitting longer than an hour when not asleep. This helps prevent blood clots.  o You may return to work once you are authorized by your doctor.     WEIGHT BEARING   Weight bearing as tolerated with assist device (walker, cane, etc) as directed, use it as long as suggested by your surgeon or therapist, typically at  least 4-6 weeks.   EXERCISES  Results after joint replacement surgery are often greatly improved when you follow the exercise, range of motion and muscle strengthening exercises prescribed by your doctor. Safety measures are also important to protect the joint from further injury. Any time any of these exercises cause you to have increased pain or swelling, decrease what you are doing until you are comfortable again and then slowly increase them. If you have problems or questions, call your caregiver or physical therapist for advice.   Rehabilitation is important following a joint replacement. After just a few days of immobilization, the muscles of the leg can become weakened and shrink (atrophy).  These exercises are designed to build up the tone and strength of the thigh and leg muscles and to improve motion. Often times heat used for twenty to thirty minutes before working out will loosen up your tissues and help with improving the range of motion but do not use heat for the first two weeks following surgery (sometimes heat can increase post-operative swelling).   These exercises can be done on a training (exercise) mat, on the floor, on a table or on a bed. Use whatever works the best and is most comfortable for you.    Use music or television while you are exercising so that the exercises are a pleasant break in your day. This will make your life better with the exercises acting as a break  in your routine that you can look forward to.   Perform all exercises about fifteen times, three times per day or as directed.  You should exercise both the operative leg and the other leg as well. ° °Exercises include: °  °• Quad Sets - Tighten up the muscle on the front of the thigh (Quad) and hold for 5-10 seconds.   °• Straight Leg Raises - With your knee straight (if you were given a brace, keep it on), lift the leg to 60 degrees, hold for 3 seconds, and slowly lower the leg.  Perform this exercise against  resistance later as your leg gets stronger.  °• Leg Slides: Lying on your back, slowly slide your foot toward your buttocks, bending your knee up off the floor (only go as far as is comfortable). Then slowly slide your foot back down until your leg is flat on the floor again.  °• Angel Wings: Lying on your back spread your legs to the side as far apart as you can without causing discomfort.  °• Hamstring Strength:  Lying on your back, push your heel against the floor with your leg straight by tightening up the muscles of your buttocks.  Repeat, but this time bend your knee to a comfortable angle, and push your heel against the floor.  You may put a pillow under the heel to make it more comfortable if necessary.  ° °A rehabilitation program following joint replacement surgery can speed recovery and prevent re-injury in the future due to weakened muscles. Contact your doctor or a physical therapist for more information on knee rehabilitation.  ° ° °CONSTIPATION ° °Constipation is defined medically as fewer than three stools per week and severe constipation as less than one stool per week.  Even if you have a regular bowel pattern at home, your normal regimen is likely to be disrupted due to multiple reasons following surgery.  Combination of anesthesia, postoperative narcotics, change in appetite and fluid intake all can affect your bowels.  ° °YOU MUST use at least one of the following options; they are listed in order of increasing strength to get the job done.  They are all available over the counter, and you may need to use some, POSSIBLY even all of these options:   ° °Drink plenty of fluids (prune juice may be helpful) and high fiber foods °Colace 100 mg by mouth twice a day  °Senokot for constipation as directed and as needed Dulcolax (bisacodyl), take with full glass of water  °Miralax (polyethylene glycol) once or twice a day as needed. ° °If you have tried all these things and are unable to have a bowel  movement in the first 3-4 days after surgery call either your surgeon or your primary doctor.   ° °If you experience loose stools or diarrhea, hold the medications until you stool forms back up.  If your symptoms do not get better within 1 week or if they get worse, check with your doctor.  If you experience "the worst abdominal pain ever" or develop nausea or vomiting, please contact the office immediately for further recommendations for treatment. ° ° °ITCHING:  If you experience itching with your medications, try taking only a single pain pill, or even half a pain pill at a time.  You can also use Benadryl over the counter for itching or also to help with sleep.  ° °TED HOSE STOCKINGS:  Use stockings on both legs until for at least 2 weeks or as   directed by physician office. They may be removed at night for sleeping.  MEDICATIONS:  See your medication summary on the After Visit Summary that nursing will review with you.  You may have some home medications which will be placed on hold until you complete the course of blood thinner medication.  It is important for you to complete the blood thinner medication as prescribed.  PRECAUTIONS:  If you experience chest pain or shortness of breath - call 911 immediately for transfer to the hospital emergency department.   If you develop a fever greater that 101 F, purulent drainage from wound, increased redness or drainage from wound, foul odor from the wound/dressing, or calf pain - CONTACT YOUR SURGEON.                                                   FOLLOW-UP APPOINTMENTS:  If you do not already have a post-op appointment, please call the office for an appointment to be seen by your surgeon.  Guidelines for how soon to be seen are listed in your After Visit Summary, but are typically between 1-4 weeks after surgery.  OTHER INSTRUCTIONS:   Knee Replacement:  Do not place pillow under knee, focus on keeping the knee straight while resting. CPM  instructions: 0-90 degrees, 2 hours in the morning, 2 hours in the afternoon, and 2 hours in the evening. Place foam block, curve side up under heel at all times except when in CPM or when walking.  DO NOT modify, tear, cut, or change the foam block in any way.  MAKE SURE YOU:   Understand these instructions.   Get help right away if you are not doing well or get worse.    Thank you for letting us be a part of your medical care team.  It is a privilege we respect greatly.  We hope these instructions will help you stay on track for a fast and full recovery!   Apixaban oral tablets What is this medicine? APIXABAN (a PIX a ban) is an anticoagulant (blood thinner). It is used to lower the chance of stroke in people with a medical condition called atrial fibrillation. It is also used to treat or prevent blood clots in the lungs or in the veins. This medicine may be used for other purposes; ask your health care provider or pharmacist if you have questions. COMMON BRAND NAME(S): Eliquis What should I tell my health care provider before I take this medicine? They need to know if you have any of these conditions: -bleeding disorders -bleeding in the brain -blood in your stools (black or tarry stools) or if you have blood in your vomit -history of stomach bleeding -kidney disease -liver disease -mechanical heart valve -an unusual or allergic reaction to apixaban, other medicines, foods, dyes, or preservatives -pregnant or trying to get pregnant -breast-feeding How should I use this medicine? Take this medicine by mouth with a glass of water. Follow the directions on the prescription label. You can take it with or without food. If it upsets your stomach, take it with food. Take your medicine at regular intervals. Do not take it more often than directed. Do not stop taking except on your doctor's advice. Stopping this medicine may increase your risk of a blot clot. Be sure to refill your  prescription before you run out of  medicine. Talk to your pediatrician regarding the use of this medicine in children. Special care may be needed. Overdosage: If you think you have taken too much of this medicine contact a poison control center or emergency room at once. NOTE: This medicine is only for you. Do not share this medicine with others. What if I miss a dose? If you miss a dose, take it as soon as you can. If it is almost time for your next dose, take only that dose. Do not take double or extra doses. What may interact with this medicine? This medicine may interact with the following: -aspirin and aspirin-like medicines -certain medicines for fungal infections like ketoconazole and itraconazole -certain medicines for seizures like carbamazepine and phenytoin -certain medicines that treat or prevent blood clots like warfarin, enoxaparin, and dalteparin -clarithromycin -NSAIDs, medicines for pain and inflammation, like ibuprofen or naproxen -rifampin -ritonavir -St. John's wort This list may not describe all possible interactions. Give your health care provider a list of all the medicines, herbs, non-prescription drugs, or dietary supplements you use. Also tell them if you smoke, drink alcohol, or use illegal drugs. Some items may interact with your medicine. What should I watch for while using this medicine? Notify your doctor or health care professional and seek emergency treatment if you develop breathing problems; changes in vision; chest pain; severe, sudden headache; pain, swelling, warmth in the leg; trouble speaking; sudden numbness or weakness of the face, arm, or leg. These can be signs that your condition has gotten worse. If you are going to have surgery, tell your doctor or health care professional that you are taking this medicine. Tell your health care professional that you use this medicine before you have a spinal or epidural procedure. Sometimes people who take this  medicine have bleeding problems around the spine when they have a spinal or epidural procedure. This bleeding is very rare. If you have a spinal or epidural procedure while on this medicine, call your health care professional immediately if you have back pain, numbness or tingling (especially in your legs and feet), muscle weakness, paralysis, or loss of bladder or bowel control. Avoid sports and activities that might cause injury while you are using this medicine. Severe falls or injuries can cause unseen bleeding. Be careful when using sharp tools or knives. Consider using an Copy. Take special care brushing or flossing your teeth. Report any injuries, bruising, or red spots on the skin to your doctor or health care professional. What side effects may I notice from receiving this medicine? Side effects that you should report to your doctor or health care professional as soon as possible: -allergic reactions like skin rash, itching or hives, swelling of the face, lips, or tongue -signs and symptoms of bleeding such as bloody or black, tarry stools; red or dark-brown urine; spitting up blood or brown material that looks like coffee grounds; red spots on the skin; unusual bruising or bleeding from the eye, gums, or nose This list may not describe all possible side effects. Call your doctor for medical advice about side effects. You may report side effects to FDA at 1-800-FDA-1088. Where should I keep my medicine? Keep out of the reach of children. Store at room temperature between 20 and 25 degrees C (68 and 77 degrees F). Throw away any unused medicine after the expiration date. NOTE: This sheet is a summary. It may not cover all possible information. If you have questions about this medicine, talk to your doctor,  pharmacist, or health care provider.  2015, [employer]. (2013-07-19 11:59:24)

## 2015-03-28 NOTE — Progress Notes (Signed)
Physical Therapy Treatment Patient Details Name: April [name] MRN: [id number] DOB: [birth year] Today's Date: 03/28/2015    History of Present Illness 81 y.o. female with history of breast cancer, s/p Rt TKA.    PT Comments    Pt continues to make slow, steady progress.  Pt concerned about DC home Monday.  Reassured pt that she will get better each day and PT will see her twice again tomorrow. Her daughter eas present for entire sesison this pm.    Follow Up Recommendations        Equipment Recommendations       Recommendations for Other Services       Precautions / Restrictions Precautions Precautions: Knee Precaution Booklet Issued: Yes (comment) Precaution Comments: Reviewed Required Braces or Orthoses: Knee Immobilizer - Right Knee Immobilizer - Right: On when out of bed or walking Restrictions Weight Bearing Restrictions: Yes RLE Weight Bearing: Weight bearing as tolerated    Mobility  Bed Mobility Overal bed mobility: Needs Assistance Bed Mobility: Sit to Supine     Supine to sit: Min assist;HOB elevated Sit to supine: Mod assist   General bed mobility comments: Required +2 assist to scoot up in bed  Transfers Overall transfer level: Needs assistance Equipment used: Rolling walker (2 wheeled) Transfers: Sit to/from Stand Sit to Stand: Mod assist        General transfer comment: Cues for technique  Ambulation/Gait Ambulation/Gait assistance: Min assist Ambulation Distance (Feet): 14 Feet Assistive device: Rolling walker (2 wheeled) Gait Pattern/deviations: Step-to pattern;Decreased stride length;Antalgic Gait velocity: greatly decreased Gait velocity interpretation: Below normal speed for age/gender     Stairs            Wheelchair Mobility    Modified Rankin (Stroke Patients Only)          Cognition Arousal/Alertness: Awake/alert Behavior During Therapy: WFL for tasks assessed/performed Overall Cognitive Status: Within Functional  Limits for tasks assessed                      Exercises Total Joint Exercises Ankle Circles/Pumps: AROM;Both;5 reps;Supine (Reviewed with pt and daughter)  Goniometric ROM:  Measured in sitting: 73 degrees flexion      General Comments General comments (skin integrity, edema, etc.): ppt with difficulty tolerating weight on RLE. Frequent tactile and verbal cues for sequencing.       Pertinent Vitals/Pain Pain Assessment: 0-10 Pain Score: 6  Pain Location: right knee Pain Descriptors / Indicators: Aching Pain Intervention(s): Limited activity within patient's tolerance;Monitored during session;Premedicated before session    Home Living Family/patient expects to be discharged to:: Private residence Living Arrangements: Alone Available Help at Discharge: Family;Available 24 hours/day (first week, maybe second week (son may be only able to come and stay second part of second week)) Type of Home: House Home Access: Stairs to enter Entrance Stairs-Rails: None Home Layout: Two level;Able to live on main level with bedroom/bathroom Home Equipment: Gilford Rile - 2 wheels;Cane - quad;Tub bench (thinks she may have a 3n1--will have daughter check to see if it is in the attic)      Prior Function Level of Independence: Independent with assistive device(s)      Comments: using cane for mobility. Ind with ADLs   PT Goals (current goals can now be found in the care plan section) Acute Rehab PT Goals Patient Stated Goal: decreased pain Progress towards PT goals: Progressing toward goals    Frequency       PT Plan Current plan remains  appropriate    Co-evaluation             End of Session Equipment Utilized During Treatment: Gait belt;Right knee immobilizer Activity Tolerance: Patient limited by pain Patient left: in bed;with call bell/phone within reach;with family/visitor present     Time: 8682-5749 PT Time Calculation (min) (ACUTE ONLY): 26 min  Charges:  $Gait  Training: 23-37 mins $Therapeutic Exercise: 8-22 mins                    G Codes:      Melvern Banker Apr 24, 2015, 3:08 PM  Lavonia Dana, PT  [phone] 2015-04-24

## 2015-03-28 NOTE — Progress Notes (Signed)
Subjective: 1 Day Post-Op Procedure(s) (LRB): TOTAL KNEE ARTHROPLASTY (Right) Patient reports pain as moderate.    Objective: Vital signs in last 24 hours: Temp:  [98 F (36.7 C)-98.2 F (36.8 C)] 98 F (36.7 C) (04/30 0056) Pulse Rate:  [58-80] 80 (04/30 0056) Resp:  [6-16] 16 (04/29 2000) BP: (115-145)/(50-85) 143/52 mmHg (04/30 0056) SpO2:  [96 %-100 %] 96 % (04/30 0056) Weight:  [80.74 kg (178 lb)] 80.74 kg (178 lb) (04/29 1300)  Intake/Output from previous day: 04/29 0701 - 04/30 0700 In: 1800 [I.V.:1800] Out: 1060 [Urine:550; Drains:460; Blood:50] Intake/Output this shift: Total I/O In: 240 [P.O.:240] Out: -    Recent Labs  03/28/15 0421  HGB 10.3*    Recent Labs  03/28/15 0421  WBC 9.5  RBC 3.62*  HCT 31.7*  PLT 188    Recent Labs  03/28/15 0421  NA 134*  K 4.3  CL 102  CO2 26  BUN 13  CREATININE 0.83  GLUCOSE 127*  CALCIUM 8.6   No results for input(s): LABPT, INR in the last 72 hours.  Neurovascular intact Sensation intact distally Intact pulses distally Dorsiflexion/Plantar flexion intact Incision: dressing C/D/I  Assessment/Plan: 1 Day Post-Op Procedure(s) (LRB): TOTAL KNEE ARTHROPLASTY (Right) Up with therapy  WBAT RLE CPM as ordered DVT proph eliquis Pain control as ordered dispo- pt doing ok with PT they do not feel would be ready for d/c home tomorrow likely Mon dsg change and hemovac d/c tomorrow  Chriss Czar 03/28/2015, 11:12 AM

## 2015-03-28 NOTE — Discharge Summary (Signed)
PATIENT ID: April [name]        MRN:  [id number]          DOB/AGE: 04/10/1929 / 81 y.o.    DISCHARGE SUMMARY  ADMISSION DATE:    03/27/2015 DISCHARGE DATE:   03/30/2015  ADMISSION DIAGNOSIS: OA RIGHT KNEE    DISCHARGE DIAGNOSIS:  OA RIGHT KNEE    ADDITIONAL DIAGNOSIS: Active Problems:   Primary osteoarthritis of right knee  Past Medical History  Diagnosis Date  . HYPERTENSION 09/30/2009  . GERD 04/18/2007  . LOW BACK PAIN SYNDROME 07/13/2009  . ROTATOR CUFF SYNDROME 05/06/2010  . BURSITIS 01/04/2008  . COSTOCHONDRITIS 11/05/2007  . ALLERGIC RHINITIS 04/18/2007  . Melanoma     removed from upper back  . Breast cancer 2012     Dr. [name], ER/PR positive L breast  . Hx of radiation therapy 10/26/11 to 12/16/11    L chest wall, L supraclavicular region  . Adenomatous colon polyp 11/2005  . Hiatal hernia   . Internal hemorrhoids   . Anemia   . Arthritis   . History of pneumonia   . History of gallstones   . Pneumonia   . Bronchitis     taking levaquin now for it.  Still runs a low grade fever at times  . Hypothyroidism     YRS AGO.....THINGS ARE FINE NOW    PROCEDURE: Procedure(s): TOTAL KNEE ARTHROPLASTY Right on 03/27/2015  CONSULTS: PT/OT  HISTORY:  See H&P in chart  HOSPITAL COURSE:  April [name] is a 81 y.o. admitted on 03/27/2015 and found to have a diagnosis of OA RIGHT KNEE.  After appropriate laboratory studies were obtained  they were taken to the operating room on 03/27/2015 and underwent  Procedure(s): TOTAL KNEE ARTHROPLASTY  Right.   They were given perioperative antibiotics:      Anti-infectives    Start     Dose/Rate Route Frequency Ordered Stop   03/28/15 0700  vancomycin (VANCOCIN) IVPB 1000 mg/200 mL premix     1,000 mg 200 mL/hr over 60 Minutes Intravenous Every 12 hours 03/27/15 1355 03/28/15 0742   03/27/15 0551  vancomycin (VANCOCIN) IVPB 1000 mg/200 mL premix     1,000 mg 200 mL/hr over 60 Minutes Intravenous On call to O.R. 03/27/15 7425  03/27/15 0745    .  Tolerated the procedure well.    POD #1, allowed out of bed to a chair.  PT for ambulation and exercise program.  IV saline locked.  O2 discontionued.  POD #2, continued PT and ambulation.   Hemovac pulled.  Slow progress with PT, patient with concerns going home, lives alone as husband is in skilled care, daughter in [place] and son in [place], [place] consuulted. . The remainder of the hospital course was dedicated to ambulation and strengthening.   The patient was discharged on 3 days post op in  Stable condition.  Blood products given:none  DIAGNOSTIC STUDIES: Recent vital signs:  Patient Vitals for the past 24 hrs:  BP Temp Temp src Pulse Resp SpO2  03/30/15 0413 (!) 108/54 mmHg 98.3 F (36.8 C) Oral 73 17 98 %  03/29/15 2014 (!) 137/59 mmHg 100 F (37.8 C) Oral 96 18 96 %  03/29/15 1200 - - - - 18 94 %       Recent laboratory studies:  Recent Labs  03/28/15 0421 03/29/15 0700 03/30/15 0550  WBC 9.5 9.0 8.3  HGB 10.3* 10.0* 9.8*  HCT 31.7* 30.9* 29.9*  PLT 188  190 215    Recent Labs  03/28/15 0421  NA 134*  K 4.3  CL 102  CO2 26  BUN 13  CREATININE 0.83  GLUCOSE 127*  CALCIUM 8.6   Lab Results  Component Value Date   INR 1.07 03/13/2015   INR 0.95 12/20/2012     Recent Radiographic Studies :  Dg Chest 2 View  03/13/2015   CLINICAL DATA:  Preop right total knee arthroplasty. History of hypertension, GERD, and hiatal hernia.  EXAM: CHEST  2 VIEW  COMPARISON:  12/26/2013  FINDINGS: Surgical clips are again seen in the left axilla and right upper quadrant of the abdomen. Cardiomediastinal silhouette is within normal limits. The lungs are hyperinflated with persistent, mild interstitial prominence. No confluent airspace opacity, pulmonary edema, pleural effusion, or pneumothorax is identified. No acute osseous abnormality is identified.  IMPRESSION: No active cardiopulmonary disease.   Electronically Signed   By: Logan Bores   On: 03/13/2015 14:27     DISCHARGE INSTRUCTIONS:  INSTRUCTIONS AFTER JOINT REPLACEMENT   o Remove items at home which could result in a fall. This includes throw rugs or furniture in walking pathways o ICE to the affected joint every three hours while awake for 30 minutes at a time, for at least the first 3-5 days, and then as needed for pain and swelling.  Continue to use ice for pain and swelling. You may notice swelling that will progress down to the foot and ankle.  This is normal after surgery.  Elevate your leg when you are not up walking on it.   o Continue to use the breathing machine you got in the hospital (incentive spirometer) which will help keep your temperature down.  It is common for your temperature to cycle up and down following surgery, especially at night when you are not up moving around and exerting yourself.  The breathing machine keeps your lungs expanded and your temperature down.   DIET:  As you were doing prior to hospitalization, we recommend a well-balanced diet.  DRESSING / WOUND CARE / SHOWERING  You may change your dressing 3-5 days after surgery.  Then change the dressing every day with sterile gauze.  Please use good hand washing techniques before changing the dressing.  Do not use any lotions or creams on the incision until instructed by your surgeon.  ACTIVITY  o Increase activity slowly as tolerated, but follow the weight bearing instructions below.   o No driving for 6 weeks or until further direction given by your physician.  You cannot drive while taking narcotics.  o No lifting or carrying greater than 10 lbs. until further directed by your surgeon. o Avoid periods of inactivity such as sitting longer than an hour when not asleep. This helps prevent blood clots.  o You may return to work once you are authorized by your doctor.     WEIGHT BEARING   Weight bearing as tolerated with assist device (walker, cane, etc) as directed, use it as long as suggested by your  surgeon or therapist, typically at least 4-6 weeks.   EXERCISES  Results after joint replacement surgery are often greatly improved when you follow the exercise, range of motion and muscle strengthening exercises prescribed by your doctor. Safety measures are also important to protect the joint from further injury. Any time any of these exercises cause you to have increased pain or swelling, decrease what you are doing until you are comfortable again and then slowly increase them. If  you have problems or questions, call your caregiver or physical therapist for advice.   Rehabilitation is important following a joint replacement. After just a few days of immobilization, the muscles of the leg can become weakened and shrink (atrophy).  These exercises are designed to build up the tone and strength of the thigh and leg muscles and to improve motion. Often times heat used for twenty to thirty minutes before working out will loosen up your tissues and help with improving the range of motion but do not use heat for the first two weeks following surgery (sometimes heat can increase post-operative swelling).   These exercises can be done on a training (exercise) mat, on the floor, on a table or on a bed. Use whatever works the best and is most comfortable for you.    Use music or television while you are exercising so that the exercises are a pleasant break in your day. This will make your life better with the exercises acting as a break in your routine that you can look forward to.   Perform all exercises about fifteen times, three times per day or as directed.  You should exercise both the operative leg and the other leg as well.  Exercises include:   . Quad Sets - Tighten up the muscle on the front of the thigh (Quad) and hold for 5-10 seconds.   . Straight Leg Raises - With your knee straight (if you were given a brace, keep it on), lift the leg to 60 degrees, hold for 3 seconds, and slowly lower the leg.   Perform this exercise against resistance later as your leg gets stronger.  . Leg Slides: Lying on your back, slowly slide your foot toward your buttocks, bending your knee up off the floor (only go as far as is comfortable). Then slowly slide your foot back down until your leg is flat on the floor again.  Glenard Haring Wings: Lying on your back spread your legs to the side as far apart as you can without causing discomfort.  . Hamstring Strength:  Lying on your back, push your heel against the floor with your leg straight by tightening up the muscles of your buttocks.  Repeat, but this time bend your knee to a comfortable angle, and push your heel against the floor.  You may put a pillow under the heel to make it more comfortable if necessary.   A rehabilitation program following joint replacement surgery can speed recovery and prevent re-injury in the future due to weakened muscles. Contact your doctor or a physical therapist for more information on knee rehabilitation.    CONSTIPATION  Constipation is defined medically as fewer than three stools per week and severe constipation as less than one stool per week.  Even if you have a regular bowel pattern at home, your normal regimen is likely to be disrupted due to multiple reasons following surgery.  Combination of anesthesia, postoperative narcotics, change in appetite and fluid intake all can affect your bowels.   YOU MUST use at least one of the following options; they are listed in order of increasing strength to get the job done.  They are all available over the counter, and you may need to use some, POSSIBLY even all of these options:    Drink plenty of fluids (prune juice may be helpful) and high fiber foods Colace 100 mg by mouth twice a day  Senokot for constipation as directed and as needed Dulcolax (bisacodyl), take with  full glass of water  Miralax (polyethylene glycol) once or twice a day as needed.  If you have tried all these things and  are unable to have a bowel movement in the first 3-4 days after surgery call either your surgeon or your primary doctor.    If you experience loose stools or diarrhea, hold the medications until you stool forms back up.  If your symptoms do not get better within 1 week or if they get worse, check with your doctor.  If you experience "the worst abdominal pain ever" or develop nausea or vomiting, please contact the office immediately for further recommendations for treatment.   ITCHING:  If you experience itching with your medications, try taking only a single pain pill, or even half a pain pill at a time.  You can also use Benadryl over the counter for itching or also to help with sleep.   TED HOSE STOCKINGS:  Use stockings on both legs until for at least 2 weeks or as directed by physician office. They may be removed at night for sleeping.  MEDICATIONS:  See your medication summary on the "After Visit Summary" that nursing will review with you.  You may have some home medications which will be placed on hold until you complete the course of blood thinner medication.  It is important for you to complete the blood thinner medication as prescribed.  PRECAUTIONS:  If you experience chest pain or shortness of breath - call 911 immediately for transfer to the hospital emergency department.   If you develop a fever greater that 101 F, purulent drainage from wound, increased redness or drainage from wound, foul odor from the wound/dressing, or calf pain - CONTACT YOUR SURGEON.                                                   FOLLOW-UP APPOINTMENTS:  If you do not already have a post-op appointment, please call the office for an appointment to be seen by your surgeon.  Guidelines for how soon to be seen are listed in your "After Visit Summary", but are typically between 1-4 weeks after surgery.  OTHER INSTRUCTIONS:   Knee Replacement:  Do not place pillow under knee, focus on keeping the knee straight  while resting. CPM instructions: 0-90 degrees, 2 hours in the morning, 2 hours in the afternoon, and 2 hours in the evening. Place foam block, curve side up under heel at all times except when in CPM or when walking.  DO NOT modify, tear, cut, or change the foam block in any way.  MAKE SURE YOU:  . Understand these instructions.  . Get help right away if you are not doing well or get worse.    Thank you for letting us be a part of your medical care team.  It is a privilege we respect greatly.  We hope these instructions will help you stay on track for a fast and full recovery!   Apixaban oral tablets What is this medicine? APIXABAN (a PIX a ban) is an anticoagulant (blood thinner). It is used to lower the chance of stroke in people with a medical condition called atrial fibrillation. It is also used to treat or prevent blood clots in the lungs or in the veins. This medicine may be used for other purposes; ask your health care provider  or pharmacist if you have questions. COMMON BRAND NAME(S): Eliquis What should I tell my health care provider before I take this medicine? They need to know if you have any of these conditions: -bleeding disorders -bleeding in the brain -blood in your stools (black or tarry stools) or if you have blood in your vomit -history of stomach bleeding -kidney disease -liver disease -mechanical heart valve -an unusual or allergic reaction to apixaban, other medicines, foods, dyes, or preservatives -pregnant or trying to get pregnant -breast-feeding How should I use this medicine? Take this medicine by mouth with a glass of water. Follow the directions on the prescription label. You can take it with or without food. If it upsets your stomach, take it with food. Take your medicine at regular intervals. Do not take it more often than directed. Do not stop taking except on your doctor's advice. Stopping this medicine may increase your risk of a blot clot. Be sure to  refill your prescription before you run out of medicine. Talk to your pediatrician regarding the use of this medicine in children. Special care may be needed. Overdosage: If you think you have taken too much of this medicine contact a poison control center or emergency room at once. NOTE: This medicine is only for you. Do not share this medicine with others. What if I miss a dose? If you miss a dose, take it as soon as you can. If it is almost time for your next dose, take only that dose. Do not take double or extra doses. What may interact with this medicine? This medicine may interact with the following: -aspirin and aspirin-like medicines -certain medicines for fungal infections like ketoconazole and itraconazole -certain medicines for seizures like carbamazepine and phenytoin -certain medicines that treat or prevent blood clots like warfarin, enoxaparin, and dalteparin -clarithromycin -NSAIDs, medicines for pain and inflammation, like ibuprofen or naproxen -rifampin -ritonavir -St. John's wort This list may not describe all possible interactions. Give your health care provider a list of all the medicines, herbs, non-prescription drugs, or dietary supplements you use. Also tell them if you smoke, drink alcohol, or use illegal drugs. Some items may interact with your medicine. What should I watch for while using this medicine? Notify your doctor or health care professional and seek emergency treatment if you develop breathing problems; changes in vision; chest pain; severe, sudden headache; pain, swelling, warmth in the leg; trouble speaking; sudden numbness or weakness of the face, arm, or leg. These can be signs that your condition has gotten worse. If you are going to have surgery, tell your doctor or health care professional that you are taking this medicine. Tell your health care professional that you use this medicine before you have a spinal or epidural procedure. Sometimes people who  take this medicine have bleeding problems around the spine when they have a spinal or epidural procedure. This bleeding is very rare. If you have a spinal or epidural procedure while on this medicine, call your health care professional immediately if you have back pain, numbness or tingling (especially in your legs and feet), muscle weakness, paralysis, or loss of bladder or bowel control. Avoid sports and activities that might cause injury while you are using this medicine. Severe falls or injuries can cause unseen bleeding. Be careful when using sharp tools or knives. Consider using an Copy. Take special care brushing or flossing your teeth. Report any injuries, bruising, or red spots on the skin to your doctor or health care  professional. What side effects may I notice from receiving this medicine? Side effects that you should report to your doctor or health care professional as soon as possible: -allergic reactions like skin rash, itching or hives, swelling of the face, lips, or tongue -signs and symptoms of bleeding such as bloody or black, tarry stools; red or dark-brown urine; spitting up blood or brown material that looks like coffee grounds; red spots on the skin; unusual bruising or bleeding from the eye, gums, or nose This list may not describe all possible side effects. Call your doctor for medical advice about side effects. You may report side effects to FDA at 1-800-FDA-1088. Where should I keep my medicine? Keep out of the reach of children. Store at room temperature between 20 and 25 degrees C (68 and 77 degrees F). Throw away any unused medicine after the expiration date. NOTE: This sheet is a summary. It may not cover all possible information. If you have questions about this medicine, talk to your doctor, pharmacist, or health care provider.  2015, [employer]vier/Gold Standard. (2013-07-19 11:59:24)    DISCHARGE MEDICATIONS:     Medication List    STOP taking these  medications        aspirin EC 81 MG tablet     HYDROcodone-homatropine 5-1.5 MG/5ML syrup  Commonly known as:  HYDROMET     ibuprofen 200 MG tablet  Commonly known as:  ADVIL,MOTRIN      TAKE these medications        albuterol 108 (90 BASE) MCG/ACT inhaler  Commonly known as:  PROVENTIL HFA;VENTOLIN HFA  Inhale 2 puffs into the lungs every 4 (four) hours as needed for wheezing or shortness of breath.     apixaban 2.5 MG Tabs tablet  Commonly known as:  ELIQUIS  Take 1 tablet (2.5 mg total) by mouth every 12 (twelve) hours.     benzonatate 200 MG capsule  Commonly known as:  TESSALON  Take 1 capsule (200 mg total) by mouth 2 (two) times daily as needed for cough.     doxycycline 100 MG tablet  Commonly known as:  VIBRA-TABS  Take 1 tablet (100 mg total) by mouth 2 (two) times daily.     guaiFENesin-dextromethorphan 100-10 MG/5ML syrup  Commonly known as:  ROBITUSSIN DM  Take 10 mLs by mouth every 4 (four) hours as needed for cough.     hydroxypropyl methylcellulose / hypromellose 2.5 % ophthalmic solution  Commonly known as:  ISOPTO TEARS / GONIOVISC  Place 1 drop into both eyes as needed for dry eyes.     levofloxacin 500 MG tablet  Commonly known as:  LEVAQUIN  Take 1 tablet (500 mg total) by mouth daily.     lisinopril 20 MG tablet  Commonly known as:  PRINIVIL,ZESTRIL  Take 1 tablet (20 mg total) by mouth daily.     omeprazole 40 MG capsule  Commonly known as:  PRILOSEC  TAKE (1) CAPSULE TWICE DAILY.     oxyCODONE 5 MG immediate release tablet  Commonly known as:  Oxy IR/ROXICODONE  Take 1-2 tablets (5-10 mg total) by mouth every 4 (four) hours as needed for moderate pain or severe pain.     polyethylene glycol packet  Commonly known as:  MIRALAX / GLYCOLAX  Take 17 g by mouth daily as needed for mild constipation.     sodium chloride 0.65 % nasal spray  Commonly known as:  OCEAN  Place 1 spray into the nose at bedtime.  SYSTANE 0.4-0.3 % Soln   Generic drug:  Polyethyl Glycol-Propyl Glycol  Place 1 drop into both eyes 3 (three) times daily.        FOLLOW UP VISIT:   Follow-up Information    Follow up with CAFFREY JR,W D, MD. Schedule an appointment as soon as possible for a visit in 2 weeks.   Specialty:  Orthopedic Surgery   Contact information:   [place] [address] [phone]       DISPOSITION:   SNF  CONDITION:  Stable   Chriss Czar, PA-C  03/30/2015 8:17 AM

## 2015-03-28 NOTE — Progress Notes (Signed)
Physical Therapy Treatment Patient Details Name: April [name] MRN: [id number] DOB: 1929/04/20 Today's Date: 03/28/2015    History of Present Illness 81 y.o. female with history of breast cancer, s/p Rt TKA.    PT Comments    Improved form yesterday, and anticipate progress will be slow but steady. Pt states she does not want to go to a SNF  Follow Up Recommendations        Equipment Recommendations       Recommendations for Other Services       Precautions / Restrictions Precautions Precautions: Knee Precaution Booklet Issued: Yes (comment) Precaution Comments: Reviewed Required Braces or Orthoses: Knee Immobilizer - Right Knee Immobilizer - Right: On when out of bed or walking Restrictions Weight Bearing Restrictions: Yes RLE Weight Bearing: Weight bearing as tolerated    Mobility  Bed Mobility Overal bed mobility: Needs Assistance Bed Mobility: Supine to Sit     Supine to sit: Min assist;HOB elevated       Transfers Overall transfer level: Needs assistance Equipment used: Rolling walker (2 wheeled) Transfers: Sit to/from Stand Sit to Stand: Mod assist Stand pivot transfers: Min assist (mainly putting weight on LLE)       General transfer comment: cues for safest technique  Ambulation/Gait Ambulation/Gait assistance: Mod assist Ambulation Distance (Feet): 8 Feet Assistive device: Rolling walker (2 wheeled) Gait Pattern/deviations: Step-to pattern;Decreased stride length;Antalgic Gait velocity: greatly decreased Gait velocity interpretation: Below normal speed for age/gender     Stairs            Wheelchair Mobility    Modified Rankin (Stroke Patients Only)       Balance Overall balance assessment: Needs assistance Sitting-balance support: No upper extremity supported;Feet supported Sitting balance-Leahy Scale: Fair     Standing balance support: Bilateral upper extremity supported Standing balance-Leahy Scale: Poor                      Cognition Arousal/Alertness: Awake/alert Behavior During Therapy: WFL for tasks assessed/performed Overall Cognitive Status: Within Functional Limits for tasks assessed                      Exercises Total Joint Exercises Ankle Circles/Pumps: AROM;Both;10 reps;Seated Quad Sets: AROM;Right;10 reps;Supine Heel Slides: AAROM;Right;10 reps;Supine Hip ABduction/ADduction: AAROM;Right;10 reps;Supine Straight Leg Raises: AAROM;Right;10 reps;Supine Long Arc Quad: AAROM;Right;Seated;10 reps Goniometric ROM:  (Did not measure due to pts pain level)    General Comments General comments (skin integrity, edema, etc.): C/O minimal nausea today      Pertinent Vitals/Pain Pain Assessment: 0-10 Pain Score: 8  Pain Location: right knee Pain Descriptors / Indicators: Aching Pain Intervention(s): Limited activity within patient's tolerance;Monitored during session;Premedicated before session    Home Living    Prior Function Level of Independence: Independent with assistive device(s)        PT Goals (current goals can now be found in the care plan section) Acute Rehab PT Goals Patient Stated Goal: decreased pain    Frequency       PT Plan Current plan remains appropriate    Co-evaluation             End of Session Equipment Utilized During Treatment: Gait belt;Right knee immobilizer Activity Tolerance: Patient limited by pain Patient left: in chair;with call bell/phone within reach     Time: 7106-2694 PT Time Calculation (min) (ACUTE ONLY): 32 min  Charges:  $Gait Training: 8-22 mins $Therapeutic Exercise: 8-22 mins  G Codes:      Melvern Banker 04-17-15, 1:19 PM  Lavonia Dana, [place] 17-Apr-2015

## 2015-03-29 LAB — CBC
HCT: 30.9 % — ABNORMAL LOW (ref 36.0–46.0)
Hemoglobin: 10 g/dL — ABNORMAL LOW (ref 12.0–15.0)
MCH: 27.7 pg (ref 26.0–34.0)
MCHC: 32.4 g/dL (ref 30.0–36.0)
MCV: 85.6 fL (ref 78.0–100.0)
Platelets: 190 10*3/uL (ref 150–400)
RBC: 3.61 MIL/uL — ABNORMAL LOW (ref 3.87–5.11)
RDW: 15 % (ref 11.5–15.5)
WBC: 9 10*3/uL (ref 4.0–10.5)

## 2015-03-29 NOTE — Progress Notes (Signed)
Orthopedic Tech Progress Note Patient Details:  TVISHA SCHWOERER 1929/03/03 [id number] Placed pt's rle in cpm @0 -60 degrees@1505  Patient ID: [name], female   DOB: 26-Jun-1929, 81 y.o.   MRN: [id number]   Hildred Priest 03/29/2015, 3:07 PM

## 2015-03-29 NOTE — Progress Notes (Signed)
Physical Therapy Treatment Patient Details Name: April [name] MRN: [id number] DOB: 1929/05/10 Today's Date: 03/29/2015    History of Present Illness 81 y.o. female with history of breast cancer, s/p Rt TKA.    PT Comments    Pt making progress with mobility and needs encouragement to increase her activity. She now reports that she will likely have a gap in her 24 hour care for a few days if she were to return home. She prefers to go to a SNF now for ongoing rehab.  Will continue to follow to progress mobility to allow for DC to most appropriate venue.    Follow Up Recommendations  SNF;Supervision for mobility/OOB     Equipment Recommendations       Recommendations for Other Services OT consult     Precautions / Restrictions Precautions Precautions: Knee Precaution Booklet Issued: Yes (comment) Precaution Comments: Reviewed Required Braces or Orthoses: Knee Immobilizer - Right Knee Immobilizer - Right: On when out of bed or walking Restrictions Weight Bearing Restrictions: Yes RLE Weight Bearing: Weight bearing as tolerated    Mobility  Bed Mobility Overal bed mobility: Needs Assistance Bed Mobility: Supine to Sit     Supine to sit: Min assist;HOB elevated     General bed mobility comments: Cues for technique and assist with RLE  Transfers Overall transfer level: Needs assistance Equipment used: Rolling walker (2 wheeled) Transfers: Sit to/from Stand Sit to Stand: Min assist         General transfer comment: cues for safest technique  Ambulation/Gait Ambulation/Gait assistance: Min assist Ambulation Distance (Feet): 22 Feet Assistive device: Rolling walker (2 wheeled) Gait Pattern/deviations: Step-to pattern;Decreased stride length;Antalgic Gait velocity: greatly decreased Gait velocity interpretation: Below normal speed for age/gender General Gait Details: Frequent VC's for sequencing. Decreased tolerance to WB through RLE   Stairs             Wheelchair Mobility    Modified Rankin (Stroke Patients Only)       Balance                                    Cognition Arousal/Alertness: Awake/alert Behavior During Therapy: WFL for tasks assessed/performed Overall Cognitive Status: Within Functional Limits for tasks assessed                      Exercises Total Joint Exercises Ankle Circles/Pumps: AROM;Both;10 reps;Supine Quad Sets: AROM;Right;10 reps;Supine Hip ABduction/ADduction: AAROM;Right;10 reps;Supine Straight Leg Raises: AAROM;Right;10 reps;Supine Long Arc Quad: AAROM;Right;Seated;10 reps Knee Flexion: AAROM;Right;5 reps;Seated Goniometric ROM: 75 degrees flexion in sitting AAROM    General Comments General comments (skin integrity, edema, etc.): No family present for today's session      Pertinent Vitals/Pain Pain Assessment: 0-10 Pain Score: 6  Pain Location: r knee Pain Intervention(s): Limited activity within patient's tolerance;Monitored during session;RN gave pain meds during session    Home Living                      Prior Function            PT Goals (current goals can now be found in the care plan section) Progress towards PT goals: Progressing toward goals    Frequency  7X/week    PT Plan Discharge plan needs to be updated    Co-evaluation             End of Session Equipment  Utilized During Treatment: Gait belt;Right knee immobilizer Activity Tolerance: Patient limited by pain Patient left: in chair;with call bell/phone within reach     Time: 0928-1000 PT Time Calculation (min) (ACUTE ONLY): 32 min  Charges:  $Gait Training: 8-22 mins $Therapeutic Exercise: 8-22 mins                    G Codes:      Melvern Banker 2015/04/16, 10:08 AM  Lavonia Dana, PT  [phone] 2015-04-16

## 2015-03-29 NOTE — Progress Notes (Signed)
Subjective: 2 Days Post-Op Procedure(s) (LRB): TOTAL KNEE ARTHROPLASTY (Right) Patient reports pain as moderate and severe.  Patient now with concerns for d/c home.  Initial plan preoperatively was her daughter would be in town for the week followed by her son coming up from [place] to stay with her an additional week.  She states her daughter will be leaving Thursday or Friday this week and not sure when her son is coming up she said "sometime next week" and feels there may be a few days without help.    Objective: Vital signs in last 24 hours: Temp:  [98 F (36.7 C)-98.4 F (36.9 C)] 98 F (36.7 C) (05/01 0456) Pulse Rate:  [89] 89 (05/01 0456) Resp:  [16] 16 (05/01 0456) BP: (129-141)/(44-55) 141/44 mmHg (05/01 0456) SpO2:  [93 %-98 %] 93 % (05/01 0456)  Intake/Output from previous day: 04/30 0701 - 05/01 0700 In: 600 [P.O.:600] Out: 260 [Urine:200; Drains:60] Intake/Output this shift: Total I/O In: 240 [P.O.:240] Out: -    Recent Labs  03/28/15 0421 03/29/15 0700  HGB 10.3* 10.0*    Recent Labs  03/28/15 0421 03/29/15 0700  WBC 9.5 9.0  RBC 3.62* 3.61*  HCT 31.7* 30.9*  PLT 188 190    Recent Labs  03/28/15 0421  NA 134*  K 4.3  CL 102  CO2 26  BUN 13  CREATININE 0.83  GLUCOSE 127*  CALCIUM 8.6   No results for input(s): LABPT, INR in the last 72 hours.  Neurovascular intact Sensation intact distally Intact pulses distally Dorsiflexion/Plantar flexion intact Incision: dressing C/D/I, scant drainage and dressing changed and hemovac d/c by me today No cellulitis present Compartment soft  Assessment/Plan: 2 Days Post-Op Procedure(s) (LRB): TOTAL KNEE ARTHROPLASTY (Right) Up with therapy Plan for discharge tomorrow WBAT RLE CPM as ordered Pain control as ordered dvt proph eliquis x30days  Dressing changes as needed  Although she continues to make progress with PT do not feel it would be safe for d/c home without 24hour supervision.  Her  husband is in skilled care facility for Alzheimer's. Daughter lives in [place] and son in [place].  Still hopeful for continued progress today with d/c home HHPT tomorrow but may need to get SW involved for possible SNF placement if she is unable to have 24hr supervision.  PT will keep [place] updated today as well.    Chriss Czar 03/29/2015, 9:34 AM

## 2015-03-29 NOTE — Progress Notes (Signed)
Occupational Therapy Treatment Patient Details Name: April [name] MRN: [id number] DOB: [birth year] Today's Date: 03/29/2015    History of present illness 81 y.o. female with history of breast cancer, s/p Rt TKA.   OT comments  This 81 yo female seen for treatment session today and making progress slowly due to pain in RLE. She will benefit from continued therapy at SNF to get back to where she can be home alone.  Follow Up Recommendations  SNF (pt progressing slowly and will not have 24 hour care for 2 weeks at home as she intially thought)    Equipment Recommendations  3 in 1 bedside comode (if daughter does not find the one she has at home)       Precautions / Restrictions Precautions Precautions: Knee Precaution Booklet Issued: Yes (comment) Precaution Comments: Reviewed Required Braces or Orthoses: Knee Immobilizer - Right Knee Immobilizer - Right: On when out of bed or walking Restrictions Weight Bearing Restrictions: Yes RLE Weight Bearing: Weight bearing as tolerated       Mobility Bed Mobility   General bed mobility comments: Pt up in recliner upon arrival  Transfers Overall transfer level: Needs assistance Equipment used: Rolling walker (2 wheeled) Transfers: Sit to/from Stand Sit to Stand: Mod assist         General transfer comment: cues for safest technique    Balance Overall balance assessment: Needs assistance Sitting-balance support: No upper extremity supported;Feet supported Sitting balance-Leahy Scale: Fair     Standing balance support: Single extremity supported Standing balance-Leahy Scale: Poor Standing balance comment: standing for LBB/D                   ADL Overall ADL's : Needs assistance/impaired         Upper Body Bathing: Set up;Sitting   Lower Body Bathing: Moderate assistance (with Mod A sit<>stand)   Upper Body Dressing : Set up;Sitting   Lower Body Dressing: Moderate assistance (with Mod A sit<>stand)                                   Cognition   Behavior During Therapy: WFL for tasks assessed/performed Overall Cognitive Status: Within Functional Limits for tasks assessed ( little problem with sequencing LB dressing today)                                    Pertinent Vitals/ Pain       Pain Assessment: 0-10 Pain Score: 6  Pain Location: rt knee Pain Descriptors / Indicators: Aching;Sore Pain Intervention(s): Monitored during session;Repositioned            Progress Toward Goals  OT Goals(current goals can now be found in the care plan section)  Progress towards OT goals: Progressing toward goals (slowly)     Plan Discharge plan needs to be updated       End of Session Equipment Utilized During Treatment: Rolling walker   Activity Tolerance Patient limited by pain   Patient Left in chair;with call bell/phone within reach;with family/visitor present (PT getting ready to start their second session with her)           Time: 1345-1418 OT Time Calculation (min): 33 min  Charges: OT General Charges $OT Visit: 1 Procedure OT Treatments $Self Care/Home Management : 23-37 mins  Almon Register [phone] 03/29/2015, 2:59 PM

## 2015-03-29 NOTE — Progress Notes (Signed)
Physical Therapy Treatment Patient Details Name: April [name] MRN: [id number] DOB: 12-10-1928 Today's Date: 03/29/2015    History of Present Illness 81 y.o. female with history of breast cancer, s/p Rt TKA.    PT Comments    Pt continues to be limited by pain. Pt's daughter present and was in agreement that best thing for pt's safety and good outcome was to go to a SNF for a short time for ongoing rehab.   Based on pt's progression I definitely continue to recommend SNF for ongoing Physical Therapy.      Follow Up Recommendations  SNF;Supervision for mobility/OOB     Equipment Recommendations       Recommendations for Other Services       Precautions / Restrictions Precautions Precautions: Knee Precaution Booklet Issued: Yes (comment) Precaution Comments: Reviewed Required Braces or Orthoses: Knee Immobilizer - Right Knee Immobilizer - Right: On when out of bed or walking Restrictions Weight Bearing Restrictions: Yes RLE Weight Bearing: Weight bearing as tolerated    Mobility  Bed Mobility Overal bed mobility: Needs Assistance Bed Mobility: Sit to Supine       Sit to supine: Min assist   General bed mobility comments: Needs assist with RLE  Transfers Overall transfer level: Needs assistance Equipment used: Rolling walker (2 wheeled) Transfers: Sit to/from Stand Sit to Stand: Min assist         General transfer comment: cues for safest technique  Ambulation/Gait Ambulation/Gait assistance: Min assist Ambulation Distance (Feet): 11 Feet Assistive device: Rolling walker (2 wheeled) Gait Pattern/deviations: Step-to pattern;Decreased stride length;Antalgic Gait velocity: greatly decreased Gait velocity interpretation: Below normal speed for age/gender General Gait Details: Frequent VC's and tactile cues for sequencing. Decreased tolerance to WB through RLE   Stairs            Wheelchair Mobility    Modified Rankin (Stroke Patients Only)        Balance                                    Cognition Arousal/Alertness: Awake/alert Behavior During Therapy: WFL for tasks assessed/performed Overall Cognitive Status: Within Functional Limits for tasks assessed                      Exercises Total Joint Exercises Ankle Circles/Pumps: AROM;Both;10 reps;Supine Quad Sets: AROM;Right;10 reps;Supine Hip ABduction/ADduction: AAROM;Right;10 reps;Supine Long Arc Quad: AAROM;Right;Seated;10 reps    General Comments General comments (skin integrity, edema, etc.): daughter present at start of session      Pertinent Vitals/Pain Pain Assessment: 0-10 Pain Score: 6  Pain Location: r knee Pain Descriptors / Indicators: Aching;Sore Pain Intervention(s): Monitored during session;Limited activity within patient's tolerance    Home Living                      Prior Function            PT Goals (current goals can now be found in the care plan section) Progress towards PT goals: Progressing toward goals    Frequency  7X/week    PT Plan Continue with current plan.    Co-evaluation             End of Session Equipment Utilized During Treatment: Gait belt;Right knee immobilizer Activity Tolerance: Patient limited by pain Patient left: with call bell/phone within reach;in bed;with bed alarm set     Time: [phone]  PT Time Calculation (min) (ACUTE ONLY): 24 min  Charges:  $Gait Training: 8-22 mins $Therapeutic Exercise: 8-22 mins                    G Codes:      Melvern Banker Apr 13, 2015, 2:52 PM Lavonia Dana, [place] Apr 13, 2015

## 2015-03-30 ENCOUNTER — Encounter (HOSPITAL_COMMUNITY): Payer: Self-pay | Admitting: Orthopedic Surgery

## 2015-03-30 DIAGNOSIS — M1990 Unspecified osteoarthritis, unspecified site: Secondary | ICD-10-CM | POA: Diagnosis not present

## 2015-03-30 DIAGNOSIS — R2681 Unsteadiness on feet: Secondary | ICD-10-CM | POA: Diagnosis not present

## 2015-03-30 DIAGNOSIS — E039 Hypothyroidism, unspecified: Secondary | ICD-10-CM | POA: Diagnosis not present

## 2015-03-30 DIAGNOSIS — K5900 Constipation, unspecified: Secondary | ICD-10-CM | POA: Diagnosis not present

## 2015-03-30 DIAGNOSIS — Z96651 Presence of right artificial knee joint: Secondary | ICD-10-CM | POA: Diagnosis not present

## 2015-03-30 DIAGNOSIS — I10 Essential (primary) hypertension: Secondary | ICD-10-CM | POA: Diagnosis not present

## 2015-03-30 DIAGNOSIS — M6281 Muscle weakness (generalized): Secondary | ICD-10-CM | POA: Diagnosis not present

## 2015-03-30 DIAGNOSIS — D649 Anemia, unspecified: Secondary | ICD-10-CM | POA: Diagnosis not present

## 2015-03-30 DIAGNOSIS — K219 Gastro-esophageal reflux disease without esophagitis: Secondary | ICD-10-CM | POA: Diagnosis not present

## 2015-03-30 DIAGNOSIS — M7989 Other specified soft tissue disorders: Secondary | ICD-10-CM | POA: Diagnosis not present

## 2015-03-30 DIAGNOSIS — K5710 Diverticulosis of small intestine without perforation or abscess without bleeding: Secondary | ICD-10-CM | POA: Diagnosis not present

## 2015-03-30 DIAGNOSIS — Z471 Aftercare following joint replacement surgery: Secondary | ICD-10-CM | POA: Diagnosis not present

## 2015-03-30 DIAGNOSIS — R278 Other lack of coordination: Secondary | ICD-10-CM | POA: Diagnosis not present

## 2015-03-30 DIAGNOSIS — D62 Acute posthemorrhagic anemia: Secondary | ICD-10-CM | POA: Diagnosis not present

## 2015-03-30 DIAGNOSIS — K210 Gastro-esophageal reflux disease with esophagitis: Secondary | ICD-10-CM | POA: Diagnosis not present

## 2015-03-30 DIAGNOSIS — K563 Gallstone ileus: Secondary | ICD-10-CM | POA: Diagnosis not present

## 2015-03-30 DIAGNOSIS — K5901 Slow transit constipation: Secondary | ICD-10-CM | POA: Diagnosis not present

## 2015-03-30 DIAGNOSIS — R52 Pain, unspecified: Secondary | ICD-10-CM | POA: Diagnosis not present

## 2015-03-30 DIAGNOSIS — M1711 Unilateral primary osteoarthritis, right knee: Secondary | ICD-10-CM | POA: Diagnosis not present

## 2015-03-30 LAB — CBC
HCT: 29.9 % — ABNORMAL LOW (ref 36.0–46.0)
Hemoglobin: 9.8 g/dL — ABNORMAL LOW (ref 12.0–15.0)
MCH: 27.9 pg (ref 26.0–34.0)
MCHC: 32.8 g/dL (ref 30.0–36.0)
MCV: 85.2 fL (ref 78.0–100.0)
PLATELETS: 215 10*3/uL (ref 150–400)
RBC: 3.51 MIL/uL — AB (ref 3.87–5.11)
RDW: 14.9 % (ref 11.5–15.5)
WBC: 8.3 10*3/uL (ref 4.0–10.5)

## 2015-03-30 NOTE — Clinical Social Work Note (Signed)
Clinical Social Work Assessment  Patient Details  Name: April [name] MRN: [id number] Date of Birth: September 13, 1929  Date of referral:  03/30/15               Reason for consult:  Discharge Planning, Facility Placement, Other (Comment Required) (Bundle patient)                Permission sought to share information with:  Facility Art therapist granted to share information::  Yes, Verbal Permission Granted  Name::     Jolyne Loa  Agency::  [facility]  Relationship::  Admissions liaison  Contact Information:  [phone]  Housing/Transportation Living arrangements for the past 2 months:  [place] of Information:  Adult Children Patient Interpreter Needed:  None Criminal Activity/Legal Involvement Pertinent to Current Situation/Hospitalization:  No - Comment as needed (Not appropriate on this admission.) Significant Relationships:  Adult Children Lives with:  Self Do you feel safe going back to the place where you live?  Yes Need for family participation in patient care:  No (Coment)  Care giving concerns:  Patient nor patient's daughter expressed any concerns regarding discharge planning.   Social Worker assessment / plan:  CSW received referral stating patient is a Bundle and will discharge to [facility] once medically stable. CSW spoke with patient's daughter to confirm discharge disposition. Per patient's daughter, patient to discharge to [facility] once medically stable. CSW to continue to follow and assist with discharge planning needs.  Employment status:  Retired Forensic scientist:  Managed Care PT Recommendations:  [place] / Referral to community resources:  [place]  Patient/Family's Response to care:  Patient and patient's daughter understanding and agreeable to CSW plan of care.  Patient/Family's Understanding of and Emotional Response to Diagnosis, Current Treatment, and Prognosis:   Patient and patient's daughter understanding and agreeable to CSW plan of care.  Emotional Assessment Appearance:  Appears stated age Attitude/Demeanor/Rapport:  Other (Pleasant) Affect (typically observed):  Accepting, Appropriate, Pleasant Orientation:  Oriented to Self, Oriented to Place, Oriented to  Time, Oriented to Situation Alcohol / Substance use:  Not Applicable Psych involvement (Current and /or in the community):  No (Comment) (Not appropriate at this time.)  Discharge Needs  Concerns to be addressed:  No discharge needs identified Readmission within the last 30 days:  No Current discharge risk:  None Barriers to Discharge:  No Barriers Identified   Caroline Sauger, LCSW 03/30/2015, 2:24 PM [phone]

## 2015-03-30 NOTE — Clinical Social Work Placement (Signed)
   CLINICAL SOCIAL WORK PLACEMENT  NOTE  Date:  03/30/2015  Patient Details  Name: April [name] MRN: [id number] Date of Birth: 14-Apr-1929  Clinical Social Work is seeking post-discharge placement for this patient at the [place] level of care (*CSW will initial, date and re-position this form in  chart as items are completed):  Yes   Patient/family provided with [place] Work Department's list of facilities offering this level of care within the geographic area requested by the patient (or if unable, by the patient's family).  Yes   Patient/family informed of their freedom to choose among providers that offer the needed level of care, that participate in Medicare, Medicaid or managed care program needed by the patient, have an available bed and are willing to accept the patient.  Yes   Patient/family informed of [facility]'s ownership interest in [facility] and [facility], as well as of the fact that they are under no obligation to receive care at these facilities.  PASRR submitted to EDS on 03/30/15     PASRR number received on 03/30/15     Existing PASRR number confirmed on       FL2 transmitted to all facilities in geographic area requested by pt/family on 03/30/15     FL2 transmitted to all facilities within larger geographic area on       Patient informed that his/her managed care company has contracts with or will negotiate with certain facilities, including the following:        Yes   Patient/family informed of bed offers received.  Patient chooses bed at [facility]     Physician recommends and patient chooses bed at [facility]    Patient to be transferred to [facility] on 03/30/15.  Patient to be transferred to facility by Patient's daughter.     Patient family notified on 03/30/15 of transfer.  Name of family member notified:  Patient updated at bedside.     PHYSICIAN       Additional Comment:     _______________________________________________ Caroline Sauger, LCSW 03/30/2015, 2:27 PM [phone]

## 2015-03-30 NOTE — Progress Notes (Signed)
April [name] discharged SNF [facility]). per MD order. Discharge instructions reviewed and discussed with patient. All questions and concerns answered. Copy of instructions and scripts given to patient. IV removed.  Patient escorted to car by staff in a wheelchair. No distress noted upon discharge.   Esaw Dace 03/30/2015 3:29 PM

## 2015-03-30 NOTE — Progress Notes (Signed)
Subjective: 3 Days Post-Op Procedure(s) (LRB): TOTAL KNEE ARTHROPLASTY (Right) Patient reports pain as moderate.    Objective: Vital signs in last 24 hours: Temp:  [98.3 F (36.8 C)-100 F (37.8 C)] 98.3 F (36.8 C) (05/02 0413) Pulse Rate:  [73-96] 73 (05/02 0413) Resp:  [17-18] 17 (05/02 0413) BP: (108-137)/(54-59) 108/54 mmHg (05/02 0413) SpO2:  [94 %-98 %] 98 % (05/02 0413)  Intake/Output from previous day: 05/01 0701 - 05/02 0700 In: 480 [P.O.:480] Out: -  Intake/Output this shift:     Recent Labs  03/28/15 0421 03/29/15 0700 03/30/15 0550  HGB 10.3* 10.0* 9.8*    Recent Labs  03/29/15 0700 03/30/15 0550  WBC 9.0 8.3  RBC 3.61* 3.51*  HCT 30.9* 29.9*  PLT 190 215    Recent Labs  03/28/15 0421  NA 134*  K 4.3  CL 102  CO2 26  BUN 13  CREATININE 0.83  GLUCOSE 127*  CALCIUM 8.6   No results for input(s): LABPT, INR in the last 72 hours.  Neurovascular intact Sensation intact distally Intact pulses distally Dorsiflexion/Plantar flexion intact Incision: dressing C/D/I  Assessment/Plan: 3 Days Post-Op Procedure(s) (LRB): TOTAL KNEE ARTHROPLASTY (Right) Up with therapy Discharge to SNF once SW has consulted and found placement DVT proph eliquis Pain med as ordered    Chriss Czar 03/30/2015, 8:17 AM

## 2015-03-30 NOTE — Anesthesia Postprocedure Evaluation (Signed)
Anesthesia Post Note  Patient: April [name]  Procedure(s) Performed: Procedure(s) (LRB): TOTAL KNEE ARTHROPLASTY (Right)  Anesthesia type: Spinal  Patient location: PACU  Post pain: Pain level controlled  Post assessment: Post-op Vital signs reviewed  Last Vitals: BP 108/54 mmHg  Pulse 73  Temp(Src) 36.8 C (Oral)  Resp 17  Wt 178 lb (80.74 kg)  SpO2 98%  Post vital signs: Reviewed  Level of consciousness: sedated  Complications: No apparent anesthesia complications

## 2015-03-30 NOTE — Progress Notes (Signed)
Physical Therapy Treatment Patient Details Name: KELEIGH KAZEE MRN: [id number] DOB: 1929/03/03 Today's Date: 03/30/2015    History of Present Illness 81 y.o. female with history of breast cancer, s/p Rt TKA.    PT Comments    Pt able to slightly progress ambulation this session, but continues to be limited due to pain. Pt still guarded during ambulation and exercises, and appears to be self-limiting due to anticipation of pain. Continue to recommend SNF for ongoing PT. Pt plans to D/C later today.   Follow Up Recommendations  SNF;Supervision for mobility/OOB     Equipment Recommendations  3in1 (PT)    Recommendations for Other Services OT consult     Precautions / Restrictions Precautions Precautions: Knee Restrictions RLE Weight Bearing: Weight bearing as tolerated    Mobility  Bed Mobility Overal bed mobility: Needs Assistance Bed Mobility: Sit to Supine     Supine to sit: Min assist;HOB elevated     General bed mobility comments: Pt in chair before and after.  Transfers Overall transfer level: Needs assistance Equipment used: Rolling walker (2 wheeled) Transfers: Sit to/from Stand Sit to Stand: Min assist         General transfer comment: Cues for hand placement and safety. Min A to power up into standing.  Ambulation/Gait Ambulation/Gait assistance: Min guard Ambulation Distance (Feet): 20 Feet Assistive device: Rolling walker (2 wheeled) Gait Pattern/deviations: Step-to pattern;Decreased stance time - right;Trunk flexed;Antalgic   Gait velocity interpretation: Below normal speed for age/gender General Gait Details: Able to slightly progress ambulation from chair to door in room. Tolerating more WB through RLE. Cues for sequencing and RW position.   Stairs            Wheelchair Mobility    Modified Rankin (Stroke Patients Only)       Balance                                    Cognition Arousal/Alertness:  Awake/alert Behavior During Therapy: WFL for tasks assessed/performed Overall Cognitive Status: Within Functional Limits for tasks assessed                      Exercises Total Joint Exercises Ankle Circles/Pumps: AROM;Both;10 reps Quad Sets: AROM;Both;10 reps Heel Slides: AAROM;Right;10 reps Hip ABduction/ADduction: AAROM;Right;10 reps Straight Leg Raises: AAROM;Right;10 reps    General Comments        Pertinent Vitals/Pain Pain Assessment: Faces Pain Score: 7  Faces Pain Scale: Hurts little more Pain Location: R knee Pain Descriptors / Indicators: Sore;Grimacing;Guarding Pain Intervention(s): Monitored during session    Home Living                      Prior Function            PT Goals (current goals can now be found in the care plan section) Progress towards PT goals: Progressing toward goals    Frequency  7X/week    PT Plan Current plan remains appropriate    Co-evaluation             End of Session Equipment Utilized During Treatment: Gait belt Activity Tolerance: Patient limited by pain Patient left: in chair;with call bell/phone within reach     Time: 1356-1420 PT Time Calculation (min) (ACUTE ONLY): 24 min  Charges:  $Gait Training: 8-22 mins $Therapeutic Exercise: 8-22 mins  G CodesRubye Oaks, [place] 03/30/2015, 2:34 PM

## 2015-03-30 NOTE — Discharge Planning (Signed)
Patient to be discharged to [facility]. Patient and patient's daughter updated regarding discharge.  Facility: [facility] Report number: [phone] Transportation: Patient's daughter  [facility] [place] Cell: [phone]       Fax: [phone] Clinical Social Work: Orthopedics [phone]) and Surgical [phone])

## 2015-03-30 NOTE — Progress Notes (Signed)
Physical Therapy Treatment Patient Details Name: April [name] MRN: [id number] DOB: 1929-03-01 Today's Date: 03/30/2015    History of Present Illness 81 y.o. female with history of breast cancer, s/p Rt TKA.    PT Comments    Pt continues to be limited with mobility due to pain and difficulty with WB through RLE. Pt very guarded during ambulation and exercises. She would benefit from continued PT to increase functional independence and safety. Continue to recommend SNF for ongoing PT. Plans to D/C later today.  Follow Up Recommendations  SNF;Supervision for mobility/OOB     Equipment Recommendations  3in1 (PT)    Recommendations for Other Services OT consult     Precautions / Restrictions Precautions Precautions: Knee Restrictions Weight Bearing Restrictions: Yes RLE Weight Bearing: Weight bearing as tolerated    Mobility  Bed Mobility Overal bed mobility: Needs Assistance Bed Mobility: Sit to Supine     Supine to sit: Min assist;HOB elevated     General bed mobility comments: Min A for RLE.  Transfers Overall transfer level: Needs assistance Equipment used: Rolling walker (2 wheeled) Transfers: Sit to/from Stand Sit to Stand: Min assist         General transfer comment: Cues for hand placement and safety. Min A to power up into standing.  Ambulation/Gait Ambulation/Gait assistance: Min assist Ambulation Distance (Feet): 5 Feet Assistive device: Rolling walker (2 wheeled) Gait Pattern/deviations: Step-to pattern;Decreased stance time - right;Decreased stride length;Antalgic;Trunk flexed   Gait velocity interpretation: Below normal speed for age/gender General Gait Details: Able to ambulate 5 ft from bed to chair. Cues for sequencing. Decreased tolerance to WB through RLE due to pain.   Stairs            Wheelchair Mobility    Modified Rankin (Stroke Patients Only)       Balance                                    Cognition  Arousal/Alertness: Awake/alert Behavior During Therapy: WFL for tasks assessed/performed Overall Cognitive Status: Within Functional Limits for tasks assessed                      Exercises Total Joint Exercises Ankle Circles/Pumps: AROM;Both;10 reps Quad Sets: AROM;5 reps;Both Heel Slides: AAROM;Right;5 reps Hip ABduction/ADduction: AAROM;Right;5 reps Straight Leg Raises: AAROM;Right;5 reps    General Comments        Pertinent Vitals/Pain Pain Assessment: 0-10 Pain Score: 7  Pain Location: R knee Pain Descriptors / Indicators: Aching;Sore Pain Intervention(s): Monitored during session;Repositioned    Home Living                      Prior Function            PT Goals (current goals can now be found in the care plan section) Progress towards PT goals: Progressing toward goals    Frequency  7X/week    PT Plan Current plan remains appropriate    Co-evaluation             End of Session Equipment Utilized During Treatment: Gait belt Activity Tolerance: Patient limited by pain Patient left: in chair;with call bell/phone within reach     Time: 1031-1054 PT Time Calculation (min) (ACUTE ONLY): 23 min  Charges:  G CodesRubye Oaks, Scott 03/30/2015, 11:06 AM

## 2015-03-30 NOTE — Op Note (Signed)
NAME:  April [name], April [name]                  ACCOUNT NO.:  [id number]  MEDICAL RECORD NO.:  [id number]  LOCATION:                                FACILITY:  [facility]  PHYSICIAN:  Lockie Pares, M.D.    DATE OF BIRTH:  [birth year]  DATE OF PROCEDURE:  03/27/2015 DATE OF DISCHARGE:  03/27/2015                              OPERATIVE REPORT   INDICATIONS:  An 81 year old with intractable knee pain with severe valgus deformity in the knee with previous trauma to her ankle with a varus deformity of right ankle thought to be amenable to hospitalization and replacement.  PREOPERATIVE DIAGNOSIS:  Osteoarthritis of the right knee with severe valgus deformity.  POSTOPERATIVE DIAGNOSIS:  Osteoarthritis of the right knee with severe valgus deformity.  OPERATION:  Right total knee replacement (Sigma cemented size 4 femur, tibia 10 mm bearing with 30 mm all-poly patella).  1, 2 open lateral release for the right knee.  SURGEON:  Lockie Pares, M.D.  ASSISTANT:  Chriss Czar, PA-C.  ANESTHESIA:  Spinal with a block.  BLOOD LOSS:  Minimal.  TOURNIQUET TIME:  80 minutes.  DESCRIPTION OF PROCEDURE:  Straight skin incision after exsanguination of the leg  placement of 350.  Medial parapatellar approach to the knee was made.  The most diseased compartment was lateral with a severe valgus deformity at about 20 degrees.  We resected 11 mm of bone off the femur and  barely gotten any bone relative to the deficient lateral condyles with a 5-degree valgus cut.  We then cut the tibia basically flushed with the most diseased portion of the posterior lateral plateau, which was severely deficient with enough slope just to barely get any bone on the most deficient posterior lateral aspect.  Extension gap was measured at 10 mm.  The femur was next sized to be a size 4.  After appropriate pins were placed, we performed the cutting the anterior- posterior femoral and chamfer cuts.  The flexion gap equaled  the extension gap at 10 mm.  We did not have to do any excessive releases and the popliteus was moderately tight as was the iliotibial band, but there was a symmetric balancing once we accomplished the cuts.  It should be noted in the record that the patient had increased valgus cut on the tibia due to the fact that she has had had a previous tibial plafond fracture treated at [facility] with a significant varus deformity of her ankle.  In order to get the knee in a more anatomic alignment, we did do an increased valgus cut with direct  visualization to try and enter the cut relative to the ankle.  It seem to get her in reasonable alignment based on visualization of the knee with the cutting block in place.  A keel hole was cut through the tibia followed by  placement of tibial trial, excess remnants of the PCL were released, which were somewhat tight, osteophytes removed from the posterior aspect of the knee.  PCL again was completely released.  Box cut was cut for the femur followed by placement of the femoral and tibial trials.  We had full extension, good  resolution of the deformity, excellent stability, good balance in ligament medially and laterally, and a negligible drawer with no tendency for the bearing to spin out.  The patella was cut leaving about 15-16 mm of native patella, but when we put the trial patella on due to the valgus deformity, it was required to do an open lateral release which corrected the patella femoral tracking well.  The trial components were again tracked and deemed to be acceptable.  Final trial components were removed.  All bony surfaces were copiously irrigated with pulsatile lavage.  We elected to use antibiotic and fragmented cement with gentamicin and cement was inserted in the doughy state, we elected to use a trial bearing to let the cement harden.  Trial bearing was removed.  No excess cement was noted in the posterior aspect of the  knee.  Tourniquet was released.  Small bleeders were coagulated.  We checked the lateral release site and there was small bleeder consistent with a branch of the superior lateral  geniculate, which was bovied.  Final bearing was placed, we then tracked all parameters that were deemed to be acceptable.  Closure was affected with #1 Ethibond.  We placed a  Hemovac drain superolaterally and rendered the closure with 2-0 Vicryl and skin clips. Lightly compressive sterile dressing and knee immobilizer was applied and taken to the recovery room in stable condition.     Lockie Pares, M.D.     WDC/MEDQ  D:  03/27/2015  T:  03/28/2015  Job:  [id number]

## 2015-03-31 ENCOUNTER — Non-Acute Institutional Stay (SKILLED_NURSING_FACILITY): Payer: Medicare Other | Admitting: Adult Health

## 2015-03-31 DIAGNOSIS — I10 Essential (primary) hypertension: Secondary | ICD-10-CM | POA: Diagnosis not present

## 2015-03-31 DIAGNOSIS — K219 Gastro-esophageal reflux disease without esophagitis: Secondary | ICD-10-CM

## 2015-03-31 DIAGNOSIS — M1711 Unilateral primary osteoarthritis, right knee: Secondary | ICD-10-CM

## 2015-03-31 DIAGNOSIS — K5900 Constipation, unspecified: Secondary | ICD-10-CM

## 2015-03-31 DIAGNOSIS — D62 Acute posthemorrhagic anemia: Secondary | ICD-10-CM | POA: Diagnosis not present

## 2015-04-01 ENCOUNTER — Non-Acute Institutional Stay (SKILLED_NURSING_FACILITY): Payer: Medicare Other | Admitting: Internal Medicine

## 2015-04-01 DIAGNOSIS — K219 Gastro-esophageal reflux disease without esophagitis: Secondary | ICD-10-CM | POA: Diagnosis not present

## 2015-04-01 DIAGNOSIS — K5901 Slow transit constipation: Secondary | ICD-10-CM | POA: Diagnosis not present

## 2015-04-01 DIAGNOSIS — M1711 Unilateral primary osteoarthritis, right knee: Secondary | ICD-10-CM

## 2015-04-01 DIAGNOSIS — I10 Essential (primary) hypertension: Secondary | ICD-10-CM

## 2015-04-01 DIAGNOSIS — D62 Acute posthemorrhagic anemia: Secondary | ICD-10-CM | POA: Diagnosis not present

## 2015-04-01 NOTE — Progress Notes (Signed)
Patient ID: April [name], female   DOB: 1929-04-21, 81 y.o.   MRN: [id number]     [facility]   PCP: Laurey Morale, MD  Code Status: full code  Allergies  Allergen Reactions  . Cefaclor Hives and Itching    Purple splotches  . Lansoprazole Hives and Itching    Purple splotches, hurting  . Penicillins Hives and Itching    Purple splotches  . Sulfa Drugs Cross Reactors Other (See Comments)    Purple whelps, itch, "just go crazy"    Chief complaint: new admission   HPI:  81 year old patient is here for short term rehabilitation post hospital admission from 03/27/15-03/30/15 with right knee OA. She underwent right total knee arthroplasty. She is seen in her room today. She has worked with therapy today and now has soreness in her right leg. She is currently on oxyIR prn for pain and she hallucinated last night where she saw butterflies coming out of the picture frame in her room and flying around her. She has thus been scared to take it and is now taking tylenol for pain. Her pain is not under control. Denies muscle spasm. No other concerns. She has PMH of HTN, allergic rhinitis, gerd among others.  Review of Systems:  Constitutional: Negative for fever, chills, diaphoresis.  HENT: Negative for headache, congestion, nasal discharge, hearing loss, earache, sore throat, difficulty swallowing.   Eyes: Negative for eye pain, blurred vision, double vision and discharge.  Respiratory: Negative for cough, shortness of breath and wheezing.   Cardiovascular: Negative for chest pain, palpitations, leg swelling.  Gastrointestinal: Negative for heartburn, nausea, vomiting, abdominal pain. Had a bowel movement this am and takes enema at home Genitourinary: Negative for dysuria Musculoskeletal: Negative for back pain, falls Skin: Negative for itching, rash.  Neurological: positive for weakness and occasional dizziness post therapy. Denies tingling or focal  weakness Psychiatric/Behavioral: Negative for depression, anxiety, insomnia and memory loss.    Past Medical History  Diagnosis Date  . HYPERTENSION 09/30/2009  . GERD 04/18/2007  . LOW BACK PAIN SYNDROME 07/13/2009  . ROTATOR CUFF SYNDROME 05/06/2010  . BURSITIS 01/04/2008  . COSTOCHONDRITIS 11/05/2007  . ALLERGIC RHINITIS 04/18/2007  . Melanoma     removed from upper back  . Breast cancer 2012     Dr. [name], ER/PR positive L breast  . Hx of radiation therapy 10/26/11 to 12/16/11    L chest wall, L supraclavicular region  . Adenomatous colon polyp 11/2005  . Hiatal hernia   . Internal hemorrhoids   . Anemia   . Arthritis   . History of pneumonia   . History of gallstones   . Pneumonia   . Bronchitis     taking levaquin now for it.  Still runs a low grade fever at times  . Hypothyroidism     YRS AGO.....THINGS ARE FINE NOW   Past Surgical History  Procedure Laterality Date  . Tonsillectomy and adenoidectomy    . Breast surgery  1968    left bx - noncancerous  . Joint replacement      left knee  . Excision of back melanoma    . Mastectomy  08/25/11    bilateral , per Dr. Donne Hazel  . Sentinel lymph node biopsy  08/25/11    bilateral  . Cholecystectomy  1979  . Ankle surgery      right  . Cataract extraction      bilateral  . Total knee arthroplasty  Right 03/27/2015    Procedure: TOTAL KNEE ARTHROPLASTY;  Surgeon: [name], MD;  Location: [facility];  Service: Orthopedics;  Laterality: Right;   Social History:   reports that she has never smoked. She has never used smokeless tobacco. She reports that she drinks about 0.6 oz of alcohol per week. She reports that she does not use illicit drugs.  Family History  Problem Relation Age of Onset  . Stroke Father   . Alcohol abuse Sister   . Thyroid cancer Mother   . Retinal detachment Mother   . Diabetes Paternal Uncle   . Cirrhosis Sister     Medications: Patient's Medications  New Prescriptions   No medications on file   Previous Medications   ALBUTEROL (PROVENTIL HFA;VENTOLIN HFA) 108 (90 BASE) MCG/ACT INHALER    Inhale 2 puffs into the lungs every 4 (four) hours as needed for wheezing or shortness of breath.   APIXABAN (ELIQUIS) 2.5 MG TABS TABLET    Take 1 tablet (2.5 mg total) by mouth every 12 (twelve) hours.   BENZONATATE (TESSALON) 200 MG CAPSULE    Take 1 capsule (200 mg total) by mouth 2 (two) times daily as needed for cough.   DOXYCYCLINE (VIBRA-TABS) 100 MG TABLET    Take 1 tablet (100 mg total) by mouth 2 (two) times daily.   GUAIFENESIN-DEXTROMETHORPHAN (ROBITUSSIN DM) 100-10 MG/5ML SYRUP    Take 10 mLs by mouth every 4 (four) hours as needed for cough.   HYDROXYPROPYL METHYLCELLULOSE / HYPROMELLOSE (ISOPTO TEARS / GONIOVISC) 2.5 % OPHTHALMIC SOLUTION    Place 1 drop into both eyes as needed for dry eyes.   LEVOFLOXACIN (LEVAQUIN) 500 MG TABLET    Take 1 tablet (500 mg total) by mouth daily.   LISINOPRIL (PRINIVIL,ZESTRIL) 20 MG TABLET    Take 1 tablet (20 mg total) by mouth daily.   OMEPRAZOLE (PRILOSEC) 40 MG CAPSULE    TAKE (1) CAPSULE TWICE DAILY.   OXYCODONE (OXY IR/ROXICODONE) 5 MG IMMEDIATE RELEASE TABLET    Take 1-2 tablets (5-10 mg total) by mouth every 4 (four) hours as needed for moderate pain or severe pain.   POLYETHYL GLYCOL-PROPYL GLYCOL (SYSTANE) 0.4-0.3 % SOLN    Place 1 drop into both eyes 3 (three) times daily.    POLYETHYLENE GLYCOL (MIRALAX / GLYCOLAX) PACKET    Take 17 g by mouth daily as needed for mild constipation.   SODIUM CHLORIDE (OCEAN) 0.65 % NASAL SPRAY    Place 1 spray into the nose at bedtime.  Modified Medications   No medications on file  Discontinued Medications   No medications on file     Physical Exam: Filed Vitals:   04/01/15 1301  BP: 119/67  Pulse: 75  Temp: 97 F (36.1 C)  Resp: 18  SpO2: 98%    General- elderly female, well built, in no acute distress Head- normocephalic, atraumatic Throat- moist mucus membrane Eyes- PERRLA, EOMI, no  pallor, no icterus, no discharge, normal conjunctiva, normal sclera Neck- no cervical lymphadenopathy Cardiovascular- normal s1,s2, no murmurs, palpable dorsalis pedis and radial pulses, right leg 1+ edema mainly around ankles, as per patient this is chronic Respiratory- bilateral clear to auscultation, no wheeze, no rhonchi, no crackles, no use of accessory muscles Abdomen- bowel sounds present, soft, non tende Musculoskeletal- able to move all 4 extremities, right knee limited range of motion  Neurological- no focal deficit Skin- warm and dry, surgical incision on right knee with ace wrap Psychiatry- alert and oriented to person, place  and time, normal mood and affect    Labs reviewed: Basic Metabolic Panel:  Recent Labs  01/26/15 1138 03/13/15 1100 03/28/15 0421  NA 136 137 134*  K 4.7 4.2 4.3  CL 102 103 102  CO2 28 25 26   GLUCOSE 80 93 127*  BUN 26* 22 13  CREATININE 1.16 1.20* 0.83  CALCIUM 10.2 9.7 8.6   Liver Function Tests:  Recent Labs  07/17/14 0910 01/26/15 1138 03/13/15 1100  AST 11 13 13   ALT 6 12 14   ALKPHOS 66 72 60  BILITOT 0.32 0.3 0.4  PROT 6.4 7.0 6.6  ALBUMIN 3.7 4.2 3.6   No results for input(s): LIPASE, AMYLASE in the last 8760 hours. No results for input(s): AMMONIA in the last 8760 hours. CBC:  Recent Labs  07/17/14 0910 01/26/15 1138 03/13/15 1100 03/28/15 0421 03/29/15 0700 03/30/15 0550  WBC 7.0 7.9 8.3 9.5 9.0 8.3  NEUTROABS 4.7 4.6 5.1  --   --   --   HGB 11.8 12.3 12.8 10.3* 10.0* 9.8*  HCT 36.4 36.7 39.1 31.7* 30.9* 29.9*  MCV 86.3 84.3 86.1 87.6 85.6 85.2  PLT 207 240.0 231 188 190 215    Assessment/Plan  Right knee OA S/p right TKA. Pain not under control. Hallucinating with oxyIR. D/c oxyIR. Start tramadol 100 mg bid with 50 mg q6h prn for breakthrough pain. Also can take tylenol 650 mg q6h prn. Has follow up with orthopedics. Continue eliquis for dvt prophylaxis. On prophylactic antibiotics doxycycline and levaquin  possibly with hx of hardware implants  Blood loss anemia Post op, clinically stable, monitor h&h  Constipation Stable. Continue miralax daily as needed. Add colace 100 mg daily for stool softener  HTN bp stable. Continue lisinopril 20 mg daily  gerd Stable, continue prilosec 40 mg twice daily  Goals of care: short term rehabilitation   Labs/tests ordered: none  Family/ staff Communication: reviewed care plan with patient and nursing supervisor    Blanchie Serve, MD  [facility] [phone] (Monday-Friday 8 am - 5 pm) [phone] (afterhours)

## 2015-04-09 DIAGNOSIS — Z96651 Presence of right artificial knee joint: Secondary | ICD-10-CM | POA: Diagnosis not present

## 2015-04-11 DIAGNOSIS — M1990 Unspecified osteoarthritis, unspecified site: Secondary | ICD-10-CM | POA: Diagnosis not present

## 2015-04-11 DIAGNOSIS — Z853 Personal history of malignant neoplasm of breast: Secondary | ICD-10-CM | POA: Diagnosis not present

## 2015-04-11 DIAGNOSIS — Z923 Personal history of irradiation: Secondary | ICD-10-CM | POA: Diagnosis not present

## 2015-04-11 DIAGNOSIS — Z96653 Presence of artificial knee joint, bilateral: Secondary | ICD-10-CM | POA: Diagnosis not present

## 2015-04-11 DIAGNOSIS — I10 Essential (primary) hypertension: Secondary | ICD-10-CM | POA: Diagnosis not present

## 2015-04-11 DIAGNOSIS — Z471 Aftercare following joint replacement surgery: Secondary | ICD-10-CM | POA: Diagnosis not present

## 2015-04-13 DIAGNOSIS — M1990 Unspecified osteoarthritis, unspecified site: Secondary | ICD-10-CM | POA: Diagnosis not present

## 2015-04-13 DIAGNOSIS — Z923 Personal history of irradiation: Secondary | ICD-10-CM | POA: Diagnosis not present

## 2015-04-13 DIAGNOSIS — I10 Essential (primary) hypertension: Secondary | ICD-10-CM | POA: Diagnosis not present

## 2015-04-13 DIAGNOSIS — Z853 Personal history of malignant neoplasm of breast: Secondary | ICD-10-CM | POA: Diagnosis not present

## 2015-04-13 DIAGNOSIS — Z96653 Presence of artificial knee joint, bilateral: Secondary | ICD-10-CM | POA: Diagnosis not present

## 2015-04-13 DIAGNOSIS — Z471 Aftercare following joint replacement surgery: Secondary | ICD-10-CM | POA: Diagnosis not present

## 2015-04-16 DIAGNOSIS — I10 Essential (primary) hypertension: Secondary | ICD-10-CM | POA: Diagnosis not present

## 2015-04-16 DIAGNOSIS — Z96653 Presence of artificial knee joint, bilateral: Secondary | ICD-10-CM | POA: Diagnosis not present

## 2015-04-16 DIAGNOSIS — Z471 Aftercare following joint replacement surgery: Secondary | ICD-10-CM | POA: Diagnosis not present

## 2015-04-16 DIAGNOSIS — M1990 Unspecified osteoarthritis, unspecified site: Secondary | ICD-10-CM | POA: Diagnosis not present

## 2015-04-16 DIAGNOSIS — Z923 Personal history of irradiation: Secondary | ICD-10-CM | POA: Diagnosis not present

## 2015-04-16 DIAGNOSIS — Z853 Personal history of malignant neoplasm of breast: Secondary | ICD-10-CM | POA: Diagnosis not present

## 2015-04-17 DIAGNOSIS — Z853 Personal history of malignant neoplasm of breast: Secondary | ICD-10-CM | POA: Diagnosis not present

## 2015-04-17 DIAGNOSIS — Z923 Personal history of irradiation: Secondary | ICD-10-CM | POA: Diagnosis not present

## 2015-04-17 DIAGNOSIS — M1990 Unspecified osteoarthritis, unspecified site: Secondary | ICD-10-CM | POA: Diagnosis not present

## 2015-04-17 DIAGNOSIS — Z96653 Presence of artificial knee joint, bilateral: Secondary | ICD-10-CM | POA: Diagnosis not present

## 2015-04-17 DIAGNOSIS — Z471 Aftercare following joint replacement surgery: Secondary | ICD-10-CM | POA: Diagnosis not present

## 2015-04-17 DIAGNOSIS — I10 Essential (primary) hypertension: Secondary | ICD-10-CM | POA: Diagnosis not present

## 2015-04-20 DIAGNOSIS — Z96651 Presence of right artificial knee joint: Secondary | ICD-10-CM | POA: Diagnosis not present

## 2015-04-21 DIAGNOSIS — M1990 Unspecified osteoarthritis, unspecified site: Secondary | ICD-10-CM | POA: Diagnosis not present

## 2015-04-21 DIAGNOSIS — Z923 Personal history of irradiation: Secondary | ICD-10-CM | POA: Diagnosis not present

## 2015-04-21 DIAGNOSIS — Z96653 Presence of artificial knee joint, bilateral: Secondary | ICD-10-CM | POA: Diagnosis not present

## 2015-04-21 DIAGNOSIS — I10 Essential (primary) hypertension: Secondary | ICD-10-CM | POA: Diagnosis not present

## 2015-04-21 DIAGNOSIS — Z853 Personal history of malignant neoplasm of breast: Secondary | ICD-10-CM | POA: Diagnosis not present

## 2015-04-21 DIAGNOSIS — Z471 Aftercare following joint replacement surgery: Secondary | ICD-10-CM | POA: Diagnosis not present

## 2015-04-29 DIAGNOSIS — M25561 Pain in right knee: Secondary | ICD-10-CM | POA: Diagnosis not present

## 2015-04-29 DIAGNOSIS — M25661 Stiffness of right knee, not elsewhere classified: Secondary | ICD-10-CM | POA: Diagnosis not present

## 2015-04-29 DIAGNOSIS — M6281 Muscle weakness (generalized): Secondary | ICD-10-CM | POA: Diagnosis not present

## 2015-04-29 DIAGNOSIS — R262 Difficulty in walking, not elsewhere classified: Secondary | ICD-10-CM | POA: Diagnosis not present

## 2015-05-05 DIAGNOSIS — M6281 Muscle weakness (generalized): Secondary | ICD-10-CM | POA: Diagnosis not present

## 2015-05-05 DIAGNOSIS — M25661 Stiffness of right knee, not elsewhere classified: Secondary | ICD-10-CM | POA: Diagnosis not present

## 2015-05-05 DIAGNOSIS — M25561 Pain in right knee: Secondary | ICD-10-CM | POA: Diagnosis not present

## 2015-05-05 DIAGNOSIS — R262 Difficulty in walking, not elsewhere classified: Secondary | ICD-10-CM | POA: Diagnosis not present

## 2015-05-07 DIAGNOSIS — M25661 Stiffness of right knee, not elsewhere classified: Secondary | ICD-10-CM | POA: Diagnosis not present

## 2015-05-07 DIAGNOSIS — M25561 Pain in right knee: Secondary | ICD-10-CM | POA: Diagnosis not present

## 2015-05-07 DIAGNOSIS — M6281 Muscle weakness (generalized): Secondary | ICD-10-CM | POA: Diagnosis not present

## 2015-05-07 DIAGNOSIS — R262 Difficulty in walking, not elsewhere classified: Secondary | ICD-10-CM | POA: Diagnosis not present

## 2015-05-12 DIAGNOSIS — Z96651 Presence of right artificial knee joint: Secondary | ICD-10-CM | POA: Diagnosis not present

## 2015-05-13 DIAGNOSIS — M6281 Muscle weakness (generalized): Secondary | ICD-10-CM | POA: Diagnosis not present

## 2015-05-13 DIAGNOSIS — M25561 Pain in right knee: Secondary | ICD-10-CM | POA: Diagnosis not present

## 2015-05-13 DIAGNOSIS — M25661 Stiffness of right knee, not elsewhere classified: Secondary | ICD-10-CM | POA: Diagnosis not present

## 2015-05-13 DIAGNOSIS — S83241D Other tear of medial meniscus, current injury, right knee, subsequent encounter: Secondary | ICD-10-CM | POA: Diagnosis not present

## 2015-05-18 ENCOUNTER — Encounter: Payer: Self-pay | Admitting: Family Medicine

## 2015-05-18 ENCOUNTER — Ambulatory Visit (INDEPENDENT_AMBULATORY_CARE_PROVIDER_SITE_OTHER): Payer: Medicare Other | Admitting: Family Medicine

## 2015-05-18 VITALS — BP 136/71 | HR 74 | Temp 98.4°F | Ht 62.0 in | Wt 171.0 lb

## 2015-05-18 DIAGNOSIS — J018 Other acute sinusitis: Secondary | ICD-10-CM

## 2015-05-18 MED ORDER — LEVOFLOXACIN 500 MG PO TABS: 500.0000 mg | ORAL_TABLET | Freq: Every day | ORAL | Status: DC

## 2015-05-18 NOTE — Progress Notes (Signed)
   Subjective:    Patient ID: April [name], female    DOB: 16-Jul-1929, 81 y.o.   MRN: [id number]  HPI Here for 4 days of sinus pressure, PND, ST, and a fever. No  Coughing.    Review of Systems  Constitutional: Positive for fever.  HENT: Positive for congestion, postnasal drip and sinus pressure.   Eyes: Negative.   Respiratory: Negative.        Objective:   Physical Exam  Constitutional: She appears well-developed and well-nourished.  HENT:  Right Ear: External ear normal.  Left Ear: External ear normal.  Nose: Nose normal.  Mouth/Throat: Oropharynx is clear and moist.  Eyes: Conjunctivae are normal.  Cardiovascular: Normal rate, regular rhythm, normal heart sounds and intact distal pulses.   Pulmonary/Chest: Effort normal and breath sounds normal.  Lymphadenopathy:    She has no cervical adenopathy.          Assessment & Plan:  Sinusitis, treat with Levaquin. Add MUcinex prn.

## 2015-05-18 NOTE — Progress Notes (Signed)
Pre visit review using our clinic review tool, if applicable. No additional management support is needed unless otherwise documented below in the visit note. 

## 2015-05-21 DIAGNOSIS — M25561 Pain in right knee: Secondary | ICD-10-CM | POA: Diagnosis not present

## 2015-05-21 DIAGNOSIS — R262 Difficulty in walking, not elsewhere classified: Secondary | ICD-10-CM | POA: Diagnosis not present

## 2015-05-21 DIAGNOSIS — M6281 Muscle weakness (generalized): Secondary | ICD-10-CM | POA: Diagnosis not present

## 2015-05-21 DIAGNOSIS — M25661 Stiffness of right knee, not elsewhere classified: Secondary | ICD-10-CM | POA: Diagnosis not present

## 2015-05-28 DIAGNOSIS — M25661 Stiffness of right knee, not elsewhere classified: Secondary | ICD-10-CM | POA: Diagnosis not present

## 2015-05-28 DIAGNOSIS — R262 Difficulty in walking, not elsewhere classified: Secondary | ICD-10-CM | POA: Diagnosis not present

## 2015-05-28 DIAGNOSIS — M25561 Pain in right knee: Secondary | ICD-10-CM | POA: Diagnosis not present

## 2015-05-28 DIAGNOSIS — M6281 Muscle weakness (generalized): Secondary | ICD-10-CM | POA: Diagnosis not present

## 2015-06-03 DIAGNOSIS — M25561 Pain in right knee: Secondary | ICD-10-CM | POA: Diagnosis not present

## 2015-06-03 DIAGNOSIS — M25661 Stiffness of right knee, not elsewhere classified: Secondary | ICD-10-CM | POA: Diagnosis not present

## 2015-06-03 DIAGNOSIS — M6281 Muscle weakness (generalized): Secondary | ICD-10-CM | POA: Diagnosis not present

## 2015-06-03 DIAGNOSIS — R262 Difficulty in walking, not elsewhere classified: Secondary | ICD-10-CM | POA: Diagnosis not present

## 2015-06-09 DIAGNOSIS — M25561 Pain in right knee: Secondary | ICD-10-CM | POA: Diagnosis not present

## 2015-06-09 DIAGNOSIS — M6281 Muscle weakness (generalized): Secondary | ICD-10-CM | POA: Diagnosis not present

## 2015-06-09 DIAGNOSIS — M25661 Stiffness of right knee, not elsewhere classified: Secondary | ICD-10-CM | POA: Diagnosis not present

## 2015-06-09 DIAGNOSIS — R262 Difficulty in walking, not elsewhere classified: Secondary | ICD-10-CM | POA: Diagnosis not present

## 2015-06-11 ENCOUNTER — Encounter (HOSPITAL_COMMUNITY): Payer: Self-pay | Admitting: Orthopedic Surgery

## 2015-06-11 DIAGNOSIS — M25561 Pain in right knee: Secondary | ICD-10-CM | POA: Diagnosis not present

## 2015-06-11 DIAGNOSIS — M6281 Muscle weakness (generalized): Secondary | ICD-10-CM | POA: Diagnosis not present

## 2015-06-11 DIAGNOSIS — R262 Difficulty in walking, not elsewhere classified: Secondary | ICD-10-CM | POA: Diagnosis not present

## 2015-06-11 DIAGNOSIS — M25661 Stiffness of right knee, not elsewhere classified: Secondary | ICD-10-CM | POA: Diagnosis not present

## 2015-06-16 DIAGNOSIS — M25661 Stiffness of right knee, not elsewhere classified: Secondary | ICD-10-CM | POA: Diagnosis not present

## 2015-06-16 DIAGNOSIS — M6281 Muscle weakness (generalized): Secondary | ICD-10-CM | POA: Diagnosis not present

## 2015-06-16 DIAGNOSIS — R262 Difficulty in walking, not elsewhere classified: Secondary | ICD-10-CM | POA: Diagnosis not present

## 2015-06-16 DIAGNOSIS — M25561 Pain in right knee: Secondary | ICD-10-CM | POA: Diagnosis not present

## 2015-06-18 DIAGNOSIS — M25661 Stiffness of right knee, not elsewhere classified: Secondary | ICD-10-CM | POA: Diagnosis not present

## 2015-06-18 DIAGNOSIS — R262 Difficulty in walking, not elsewhere classified: Secondary | ICD-10-CM | POA: Diagnosis not present

## 2015-06-18 DIAGNOSIS — M25561 Pain in right knee: Secondary | ICD-10-CM | POA: Diagnosis not present

## 2015-06-18 DIAGNOSIS — M6281 Muscle weakness (generalized): Secondary | ICD-10-CM | POA: Diagnosis not present

## 2015-06-25 DIAGNOSIS — M25561 Pain in right knee: Secondary | ICD-10-CM | POA: Diagnosis not present

## 2015-06-30 ENCOUNTER — Ambulatory Visit (INDEPENDENT_AMBULATORY_CARE_PROVIDER_SITE_OTHER): Payer: Medicare Other | Admitting: Family Medicine

## 2015-06-30 ENCOUNTER — Encounter: Payer: Self-pay | Admitting: Family Medicine

## 2015-06-30 VITALS — BP 136/66 | HR 83 | Temp 98.4°F | Ht 62.0 in | Wt 170.0 lb

## 2015-06-30 DIAGNOSIS — J019 Acute sinusitis, unspecified: Secondary | ICD-10-CM | POA: Diagnosis not present

## 2015-06-30 MED ORDER — LEVOFLOXACIN 500 MG PO TABS: 500.0000 mg | ORAL_TABLET | Freq: Every day | ORAL | Status: AC

## 2015-06-30 NOTE — Progress Notes (Signed)
Pre visit review using our clinic review tool, if applicable. No additional management support is needed unless otherwise documented below in the visit note. 

## 2015-06-30 NOTE — Progress Notes (Signed)
   Subjective:    Patient ID: April [name], female    DOB: 11-11-1929, 81 y.o.   MRN: [id number]  HPI Here for 4 days of sinus pressure, PND, ST, a dry cough, and fever to 100 degrees. Taking Advil.    Review of Systems  Constitutional: Positive for fever.  HENT: Positive for congestion, postnasal drip and sinus pressure.   Eyes: Negative.   Respiratory: Positive for cough.        Objective:   Physical Exam  Constitutional: She appears well-developed and well-nourished.  HENT:  Right Ear: External ear normal.  Left Ear: External ear normal.  Nose: Nose normal.  Mouth/Throat: Oropharynx is clear and moist.  Eyes: Conjunctivae are normal.  Neck: No thyromegaly present.  Cardiovascular: Normal rate, regular rhythm, normal heart sounds and intact distal pulses.   Pulmonary/Chest: Effort normal and breath sounds normal.  Lymphadenopathy:    She has no cervical adenopathy.          Assessment & Plan:  Sinusitis, treat with Levaquin.

## 2015-07-06 ENCOUNTER — Telehealth: Payer: Self-pay | Admitting: *Deleted

## 2015-07-06 NOTE — Telephone Encounter (Signed)
VM message received from patient regarding an MRI she was called about from [facility]. She couldn't remember what it was about.  Reviewed her chart and returned call to pt. She is to have abdominal MRI prior to her appointment with Dr. Jana Hakim on August 30th. She didn't remember that appt either. Reviewed appt date and times with her and she will call [facility] imaging in the morning to schedule her MRI. No other concerns verbalized.

## 2015-07-07 ENCOUNTER — Inpatient Hospital Stay: Admission: RE | Admit: 2015-07-07 | Payer: Medicare Other | Source: Ambulatory Visit

## 2015-07-22 ENCOUNTER — Ambulatory Visit
Admission: RE | Admit: 2015-07-22 | Discharge: 2015-07-22 | Disposition: A | Discharge status: Home / Self Care | Payer: 59 | Source: Ambulatory Visit | Attending: Nurse Practitioner | Admitting: Nurse Practitioner

## 2015-07-22 DIAGNOSIS — N281 Cyst of kidney, acquired: Secondary | ICD-10-CM | POA: Diagnosis not present

## 2015-07-22 DIAGNOSIS — K7689 Other specified diseases of liver: Secondary | ICD-10-CM | POA: Diagnosis not present

## 2015-07-22 DIAGNOSIS — K863 Pseudocyst of pancreas: Secondary | ICD-10-CM

## 2015-07-22 DIAGNOSIS — K862 Cyst of pancreas: Secondary | ICD-10-CM | POA: Diagnosis not present

## 2015-07-22 DIAGNOSIS — C50919 Malignant neoplasm of unspecified site of unspecified female breast: Secondary | ICD-10-CM

## 2015-07-22 MED ORDER — GADOBENATE DIMEGLUMINE 529 MG/ML IV SOLN
15.0000 mL | Freq: Once | INTRAVENOUS | Status: AC | PRN
  Administered 2015-07-22: 15 mL via INTRAVENOUS

## 2015-07-28 ENCOUNTER — Ambulatory Visit (HOSPITAL_BASED_OUTPATIENT_CLINIC_OR_DEPARTMENT_OTHER): Payer: 59 | Admitting: Oncology

## 2015-07-28 ENCOUNTER — Other Ambulatory Visit (HOSPITAL_BASED_OUTPATIENT_CLINIC_OR_DEPARTMENT_OTHER): Payer: Medicare Other

## 2015-07-28 VITALS — BP 139/80 | HR 70 | Temp 97.8°F | Resp 18 | Ht 62.0 in | Wt 173.9 lb

## 2015-07-28 DIAGNOSIS — Z85820 Personal history of malignant melanoma of skin: Secondary | ICD-10-CM

## 2015-07-28 DIAGNOSIS — C50912 Malignant neoplasm of unspecified site of left female breast: Secondary | ICD-10-CM

## 2015-07-28 DIAGNOSIS — Z853 Personal history of malignant neoplasm of breast: Secondary | ICD-10-CM

## 2015-07-28 DIAGNOSIS — K862 Cyst of pancreas: Secondary | ICD-10-CM

## 2015-07-28 DIAGNOSIS — C50919 Malignant neoplasm of unspecified site of unspecified female breast: Secondary | ICD-10-CM

## 2015-07-28 LAB — CBC WITH DIFFERENTIAL/PLATELET
BASO%: 0.5 % (ref 0.0–2.0)
Basophils Absolute: 0 10*3/uL (ref 0.0–0.1)
EOS ABS: 0.2 10*3/uL (ref 0.0–0.5)
EOS%: 3.7 % (ref 0.0–7.0)
HCT: 33.8 % — ABNORMAL LOW (ref 34.8–46.6)
HEMOGLOBIN: 10.8 g/dL — AB (ref 11.6–15.9)
LYMPH%: 28.7 % (ref 14.0–49.7)
MCH: 26.2 pg (ref 25.1–34.0)
MCHC: 32 g/dL (ref 31.5–36.0)
MCV: 81.8 fL (ref 79.5–101.0)
MONO#: 0.7 10*3/uL (ref 0.1–0.9)
MONO%: 10.1 % (ref 0.0–14.0)
NEUT#: 3.7 10*3/uL (ref 1.5–6.5)
NEUT%: 57 % (ref 38.4–76.8)
Platelets: 188 10*3/uL (ref 145–400)
RBC: 4.13 10*6/uL (ref 3.70–5.45)
RDW: 16.4 % — ABNORMAL HIGH (ref 11.2–14.5)
WBC: 6.5 10*3/uL (ref 3.9–10.3)
lymph#: 1.9 10*3/uL (ref 0.9–3.3)

## 2015-07-28 LAB — COMPREHENSIVE METABOLIC PANEL (CC13)
ALBUMIN: 3.6 g/dL (ref 3.5–5.0)
ALT: 10 U/L (ref 0–55)
AST: 11 U/L (ref 5–34)
Alkaline Phosphatase: 70 U/L (ref 40–150)
Anion Gap: 7 mEq/L (ref 3–11)
BUN: 22.8 mg/dL (ref 7.0–26.0)
CO2: 24 mEq/L (ref 22–29)
Calcium: 9.4 mg/dL (ref 8.4–10.4)
Chloride: 110 mEq/L — ABNORMAL HIGH (ref 98–109)
Creatinine: 0.9 mg/dL (ref 0.6–1.1)
EGFR: 58 mL/min/{1.73_m2} — ABNORMAL LOW (ref >90)
GLUCOSE: 95 mg/dL (ref 70–140)
Potassium: 4.2 mEq/L (ref 3.5–5.1)
SODIUM: 141 meq/L (ref 136–145)
Total Bilirubin: <0.2 mg/dL (ref 0.20–1.20)
Total Protein: 6.2 g/dL — ABNORMAL LOW (ref 6.4–8.3)

## 2015-07-28 NOTE — Progress Notes (Signed)
ID: April [name]   DOB: Feb 15, 1929  MR#: [id number]  AQT#:[id number]  PCP: Laurey Morale, MD GYN:  SURolm Bookbinder OTHER MD: Franchot Gallo, Kyung Rudd, Lucio Edward, Crista Luria  CHIEF COMPLAINT: bilateral breast cancer, status post bilateral mastectomies CURRENT THERAPY: observation  BREAST CANCER HISTORY: According to Dr. Julien Girt 02/23/2011 note: She presented with a palpable mass in her left breast.  She was referred for a mammogram on 02/14/2011.  At that time diagnostic mammogram and ultrasound showed scattered fibroglandular densities in the lateral aspect of the left breast there was an irregular spiculated mass measuring 3.5 x 4.1 x 3.8 cm.  The physical exam at that time showed a hard palpable mass at 3 o'clock position in the left breast with some skin retraction.  Ultrasound was performed, which showed a hypoechoic mass with shadowing 10 cm from the nipple measuring 2.2 x 2.0 mm.  In the right breast there was two hypoechoic masses at 12 o'clock position 3 cm from the nipple measuring 5 x 5 mm each.  Biopsies recommended.  Biopsy on the suspicious lymph node was done at the same time is the primary mass on 02/14/2011.  Pathology showed this to be invasive mammary carcinoma.  The left axillary mass has insufficient tissue for diagnosis, the tumor was ER and PR positive, 100% strong staining intensity, proliferative index low at 36%.  The HER-2 ratio was 1.19.  The patient underwent a MRI scan of both breasts on 02/21/2011.  In the tail of the left breast there was a mass measuring 2.7 x 2.6 x 2.9 cm medial margin of both pectoralis muscle.  In the right breast there was clumped nodular enhancement at 12 o'clock position.  This area measured 1.8 cm.  Sentinel left axillary lymph node was also noted, which appeared to be suspicious. Her subsequent history is as detailed below.  INTERVAL HISTORY: April [name] returns today for follow up of her bilateral breast cancer and oncocytoma. The  interval history is chiefly significant for her husband have been gone to assisted living, because of his severe Alzheimer's disease. She is now living by herself, which she does not like, but on the other hand she has a lot more time to herself.  REVIEW OF SYSTEMS: April [name] had right knee surgery early April of this year. She is getting over that. She is not exercising regularly but she is [facility] Of course she has her orthopedic problems which limit her. A detailed review of systems today was otherwise stable   PAST MEDICAL HISTORY: Past Medical History  Diagnosis Date  . HYPERTENSION 09/30/2009  . GERD 04/18/2007  . LOW BACK PAIN SYNDROME 07/13/2009  . ROTATOR CUFF SYNDROME 05/06/2010  . BURSITIS 01/04/2008  . COSTOCHONDRITIS 11/05/2007  . ALLERGIC RHINITIS 04/18/2007  . Melanoma     removed from upper back  . Breast cancer 2012     Dr. [name], ER/PR positive L breast  . Hx of radiation therapy 10/26/11 to 12/16/11    L chest wall, L supraclavicular region  . Adenomatous colon polyp 11/2005  . Hiatal hernia   . Internal hemorrhoids   . Anemia   . Arthritis   . History of pneumonia   . History of gallstones   . Pneumonia   . Bronchitis     taking levaquin now for it.  Still runs a low grade fever at times  . Hypothyroidism     YRS AGO.....THINGS ARE FINE NOW  PAST SURGICAL HISTORY: Past Surgical History  Procedure Laterality Date  . Tonsillectomy and adenoidectomy    . Breast surgery  1968    left bx - noncancerous  . Joint replacement      left knee  . Excision of back melanoma    . Mastectomy  08/25/11    bilateral , per Dr. Donne Hazel  . Sentinel lymph node biopsy  08/25/11    bilateral  . Cholecystectomy  1979  . Ankle surgery      right  . Cataract extraction      bilateral  . Total knee arthroplasty Right 03/27/2015    Procedure: TOTAL KNEE ARTHROPLASTY;  Surgeon: [name], MD;  Location: [place];  Service: Orthopedics;  Laterality: Right;     FAMILY HISTORY Family History  Problem Relation Age of Onset  . Stroke Father   . Alcohol abuse Sister   . Thyroid cancer Mother   . Retinal detachment Mother   . Diabetes Paternal Uncle   . Cirrhosis Sister    the patient's father died at the age of 95 following a stroke. He had an independent in driving up to one month prior to dying. [name] died from thyroid cancer at the age of 79. This had been diagnosed less than a year before her death. The patient had one brother and one sister. There is no history of breast or ovarian cancer in the family to her knowledge.  GYNECOLOGIC HISTORY: Menarche age 42, first live birth age 89, she is [place] P2. She underwent menopause around 77. She did not take hormone replacement.  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 Her husband Casey Burkitt [facility] Junior is a retired Art gallery manager. Unfortunately she tells me he is severely demented at present. She is the primary caregiver. Son Casey Burkitt Finley the third lives in [place] and works for the [employer]. Daughter [name] lives in [place] and works with a [employer]. The patient has 3 grandchildren. She attends a [employer].   ADVANCED DIRECTIVES: In place  HEALTH MAINTENANCE: Social History  Substance Use Topics  . Smoking status: Never Smoker   . Smokeless tobacco: Never Used  . Alcohol Use: 0.6 oz/week    1 Standard drinks or equivalent per week     Comment: socially     Colonoscopy:  PAP:  Bone density:  Lipid panel:  Allergies  Allergen Reactions  . Cefaclor Hives and Itching    Purple splotches  . Lansoprazole Hives and Itching    Purple splotches, hurting  . Penicillins Hives and Itching    Purple splotches  . Sulfa Drugs Cross Reactors Other (See Comments)    Purple whelps, itch, "just go crazy"    Current Outpatient Prescriptions  Medication Sig Dispense Refill  . albuterol  (PROVENTIL HFA;VENTOLIN HFA) 108 (90 BASE) MCG/ACT inhaler Inhale 2 puffs into the lungs every 4 (four) hours as needed for wheezing or shortness of breath. 1 Inhaler 2  . hydroxypropyl methylcellulose / hypromellose (ISOPTO TEARS / GONIOVISC) 2.5 % ophthalmic solution Place 1 drop into both eyes as needed for dry eyes.    April [name] Kitchen ibuprofen (ADVIL,MOTRIN) 200 MG tablet Take 200 mg by mouth every 6 (six) hours as needed.    April [name] Kitchen lisinopril (PRINIVIL,ZESTRIL) 20 MG tablet Take 1 tablet (20 mg total) by mouth daily. 90 tablet 3  . omeprazole (PRILOSEC) 40 MG capsule TAKE (1) CAPSULE TWICE DAILY. (Patient taking differently: TAKE (1) CAPSULE  daily) 60 capsule 11  . Polyethyl Glycol-Propyl Glycol (SYSTANE) 0.4-0.3 % SOLN Place 1 drop into both eyes 3 (three) times daily.     . polyethylene glycol (MIRALAX / GLYCOLAX) packet Take 17 g by mouth daily as needed for mild constipation.     No current facility-administered medications for this visit.    OBJECTIVE: Elderly white woman walking with a cane Filed Vitals:   07/28/15 1351  BP: 139/80  Pulse: 70  Temp: 97.8 F (36.6 C)  Resp: 18     Body mass index is 31.8 kg/(m^2).    ECOG FS: 1  Sclerae unicteric, EOMs intact Oropharynx clear, dentition in fair repair No cervical or supraclavicular adenopathy Lungs no rales or rhonchi Heart regular rate and rhythm Abd soft, nontender, positive bowel sounds MSK no focal spinal tenderness, obvious osteoarthritic deformities involving multiple finger joints Neuro: nonfocal, well oriented, positive affect Breasts: Status post bilateral mastectomies. The left sided scar is somewhat irregular, but soft, with no erythema or hard foci to suggest recurrence.  Both axillae are benign.   LAB RESULTS: Lab Results  Component Value Date   WBC 6.5 07/28/2015   NEUTROABS 3.7 07/28/2015   HGB 10.8* 07/28/2015   HCT 33.8* 07/28/2015   MCV 81.8 07/28/2015   PLT 188 07/28/2015      Chemistry      Component Value  Date/Time   NA 134* 03/28/2015 0421   NA 141 07/17/2014 0910   K 4.3 03/28/2015 0421   K 4.2 07/17/2014 0910   CL 102 03/28/2015 0421   CL 108* 12/25/2012 1235   CO2 26 03/28/2015 0421   CO2 27 07/17/2014 0910   BUN 13 03/28/2015 0421   BUN 21.1 07/17/2014 0910   CREATININE 0.83 03/28/2015 0421   CREATININE 1.0 07/17/2014 0910      Component Value Date/Time   CALCIUM 8.6 03/28/2015 0421   CALCIUM 9.8 07/17/2014 0910   ALKPHOS 60 03/13/2015 1100   ALKPHOS 66 07/17/2014 0910   AST 13 03/13/2015 1100   AST 11 07/17/2014 0910   ALT 14 03/13/2015 1100   ALT 6 07/17/2014 0910   BILITOT 0.4 03/13/2015 1100   BILITOT 0.32 07/17/2014 0910       Lab Results  Component Value Date   LABCA2 12 12/25/2012    No components found for: [id number]  No results for input(s): INR in the last 168 hours.  Urinalysis    Component Value Date/Time   COLORURINE YELLOW 03/13/2015 1042   APPEARANCEUR CLEAR 03/13/2015 1042   LABSPEC 1.007 03/13/2015 1042   PHURINE 6.5 03/13/2015 1042   GLUCOSEU NEGATIVE 03/13/2015 1042   HGBUR NEGATIVE 03/13/2015 1042   HGBUR negative 05/24/2010 1555   BILIRUBINUR NEGATIVE 03/13/2015 1042   BILIRUBINUR 1+ 06/23/2014 1549   KETONESUR NEGATIVE 03/13/2015 1042   PROTEINUR NEGATIVE 03/13/2015 1042   PROTEINUR trace 06/23/2014 1549   UROBILINOGEN 0.2 03/13/2015 1042   UROBILINOGEN 0.2 06/23/2014 1549   NITRITE NEGATIVE 03/13/2015 1042   NITRITE n 06/23/2014 1549   LEUKOCYTESUR NEGATIVE 03/13/2015 1042    STUDIES: Mr Abdomen W Wo Contrast  07/22/2015   CLINICAL DATA:  Subsequent encounter for cystic pancreatic lesion.  EXAM: MRI ABDOMEN WITHOUT AND WITH CONTRAST  TECHNIQUE: Multiplanar multisequence MR imaging of the abdomen was performed both before and after the administration of intravenous contrast.  CONTRAST:  73m MULTIHANCE GADOBENATE DIMEGLUMINE 529 MG/ML IV SOLN  COMPARISON:  07/17/2014.  07/17/2013.  FINDINGS: [facility]  [address]  Results: Creatinine 1.0 mg/dL.  Lower chest:  Unremarkable.  Hepatobiliary: Multiple hepatic cysts are again identified, measuring up to 3.5 cm in maximum diameter. The tiny subcapsular focus of hyper enhancement seen posterior medially in the right liver (image 12 39 series 12) is compatible with transient hepatic intensity difference, unchanged in the interval. This may be vascular malformation or flash filling lesion. Gallbladder is surgically absent. Stable appearance of mild extrahepatic biliary distention, potentially secondary to prior cholecystectomy.  Pancreas: The homogeneous T1 hypo intense, T2 hyperintense lesion in the uncinate process of the pancreas is again identified. This is not substantially changed in the 1 year interval since the most recent comparison study and also is not appreciably different than the study from 2 years ago. Coronal dimension today is 17 mm compared to 16 mm previously. Craniocaudal dimension is 19 mm today which is unchanged since the previous 2 studies. There is no evidence for internal septation. No wall irregularity or thickening. No suspicious enhancement. No associated dilatation of the main pancreatic duct.  Spleen: No splenomegaly. No focal mass lesion.  Adrenals/Urinary Tract: Innumerable renal cysts are again noted of varying sizes, measuring up to approximately 15 mm maximum dimension. The 3.1 x 3.0 cm heterogeneously enhancing mass in the anterior right kidney measures 3.0 x 3.1 cm today compared to 2.9 x 2.8 cm previously. No adrenal nodule or mass. No hydronephrosis.  Stomach/Bowel: Stomach is nondistended. No gastric wall thickening. No evidence of outlet obstruction. Duodenum is normally positioned as is the ligament of Treitz.  Vascular/Lymphatic: No abdominal aortic aneurysm. No abdominal atherosclerotic calcification. There is no gastrohepatic or hepatoduodenal ligament lymphadenopathy. No intraperitoneal or  retroperitoneal lymphadenopy.  Other: No intraperitoneal free fluid.  Musculoskeletal: Scattered areas of abnormal marrow signal within the thoracolumbar spine are stable in the interval.  IMPRESSION: 1. Stable appearance of the cystic lesion in the uncinate process of the pancreas since 2014. Imaging features and interval stability suggest benign process. Consensus guidelines indicate no additional imaging follow-up of this particular lesion is warranted. This recommendation follows ACR consensus guidelines: Managing Incidental Findings on Abdominal CT: White Paper of the ACR Incidental Findings Committee. J Am Coll Radiol 2010;7:754-773 . 2. Stable heterogeneously enhancing right renal lesion, status post biopsy on 12/20/2012 with results indicating oncocytoma. 3. Innumerable renal cysts. 4. Multiple hepatic cysts.   Electronically Signed   By: Misty Stanley M.D.   On: 07/22/2015 13:35     ASSESSMENT: 81 y.o. 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  (2) status post bilateral mastectomies 08/25/2011, showing  (a) on the right, ductal carcinoma in situ, low-grade, estrogen receptor 100% and progesterone receptor 80% positive, with ample margins  (b) on the left, a pT2 pN1, stage IIB invasive ductal carcinoma, grade 1, 100% estrogen and 100% progesterone receptor positive, with an MIB-1 of 36%, and no HER-2 amplification.  (3) left postmastectomy radiation completed 12/27/2011  (4) took aromatase inhibitors (letrozole and anastrozole) prior to her bilateral mastectomies, with poor tolerance. Took tamoxifen briefly after completing radiation, and again with poor tolerance. 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 I am comfortable releasing her  to her primary care physician at this point and she very much likes that idea. She was not interested in our "survivorship" program.  The cystic pancreatic lesion requires no further follow-up. The kidney oncocytoma has not changed in 2 years area I think he would be useful if she met with Dr. doll stead perhaps one more time about a year from now regarding that and I am setting that appointment up  Otherwise as far as breast cancer is concerned all April [name] will need is a yearly physician breast exam.  I will be glad to see her at any point in the future if and when the need arises, but as of now we're making no further routine appointments for her here. lMAGRINAT,Rikki Trosper C    07/28/2015

## 2015-08-05 ENCOUNTER — Other Ambulatory Visit: Payer: Self-pay | Admitting: Family Medicine

## 2015-08-09 NOTE — Progress Notes (Signed)
Patient ID: MARCIANNE OZBUN, female   DOB: 21-Jun-1929, 81 y.o.   MRN: [id number]    DATE:  03/31/15 MRN:  [id number]  BIRTHDAY: [birth year]  Facility:  Nursing Home Location:  [place] Room Number: 1203-P  LEVEL OF CARE:  [id number])  Contact Information    Name Relation Home Work [place] Son [phone]  [phone]   Gasper Lloyd   [phone]   Taylor,Marguerite Daughter [phone]  [phone]      Chief Complaint  Patient presents with  . Hospitalization Follow-up    Osteoarthritis S/P right total knee arthroplasty, hypertension, GERD, constipation and anemia    HISTORY OF PRESENT ILLNESS:  This is an 81 year old female who was been admitted to [facility] on 03/30/15 from [facility]. She has PMH of hypertension, GERD, low back pain syndrome, allergic rhinitis and hypothyroidism. She has osteoporosis for which she had right total knee arthroplasty on 4/30.  She has been admitted for a short-term rehabilitation.  PAST MEDICAL HISTORY:  Past Medical History  Diagnosis Date  . HYPERTENSION 09/30/2009  . GERD 04/18/2007  . LOW BACK PAIN SYNDROME 07/13/2009  . ROTATOR CUFF SYNDROME 05/06/2010  . BURSITIS 01/04/2008  . COSTOCHONDRITIS 11/05/2007  . ALLERGIC RHINITIS 04/18/2007  . Melanoma     removed from upper back  . Breast cancer 2012     Dr. [name], ER/PR positive L breast  . Hx of radiation therapy 10/26/11 to 12/16/11    L chest wall, L supraclavicular region  . Adenomatous colon polyp 11/2005  . Hiatal hernia   . Internal hemorrhoids   . Anemia   . Arthritis   . History of pneumonia   . History of gallstones   . Pneumonia   . Bronchitis     taking levaquin now for it.  Still runs a low grade fever at times  . Hypothyroidism     YRS AGO.....THINGS ARE FINE NOW     CURRENT MEDICATIONS: Reviewed  Per MAR   Allergies  Allergen Reactions  . Cefaclor Hives and Itching    Purple splotches  . Lansoprazole  Hives and Itching    Purple splotches, hurting  . Penicillins Hives and Itching    Purple splotches  . Sulfa Drugs Cross Reactors Other (See Comments)    Purple whelps, itch, "just go crazy"     REVIEW OF SYSTEMS:  GENERAL: no change in appetite, no fatigue, no weight changes, no fever, chills or weakness EYES: Denies change in vision, dry eyes, eye pain, itching or discharge EARS: Denies change in hearing, ringing in ears, or earache NOSE: Denies nasal congestion or epistaxis MOUTH and THROAT: Denies oral discomfort, gingival pain or bleeding, pain from teeth or hoarseness   RESPIRATORY: no cough, SOB, DOE, wheezing, hemoptysis CARDIAC: no chest pain, edema or palpitations GI: no abdominal pain, diarrhea, constipation, heart burn, nausea or vomiting GU: Denies dysuria, frequency, hematuria, incontinence, or discharge PSYCHIATRIC: Denies feeling of depression or anxiety. No report of hallucinations, insomnia, paranoia, or agitation   PHYSICAL EXAMINATION  GENERAL APPEARANCE: Well nourished. In no acute distress. Normal body habitus SKIN:  Right knee surgical incision has staples intact, dry, no erythema  HEAD: Normal in size and contour. No evidence of trauma EYES: Lids open and close normally. No blepharitis, entropion or ectropion. PERRL. Conjunctivae are clear and sclerae are white. Lenses are without opacity EARS: Pinnae are normal. Patient hears normal voice tunes of the examiner MOUTH and THROAT:  Lips are without lesions. Oral mucosa is moist and without lesions. Tongue is normal in shape, size, and color and without lesions NECK: supple, trachea midline, no neck masses, no thyroid tenderness, no thyromegaly LYMPHATICS: no LAN in the neck, no supraclavicular LAN RESPIRATORY: breathing is even & unlabored, BS CTAB CARDIAC: RRR, no murmur,no extra heart sounds, no edema GI: abdomen soft, normal BS, no masses, no tenderness, no hepatomegaly, no splenomegaly EXTREMITIES:  Able  to move 4 extremities; right ankle is crooked due to a previous injury PSYCHIATRIC: Alert and oriented X 3. Affect and behavior are appropriate  LABS/RADIOLOGY: Labs reviewed: Basic Metabolic Panel: Recent Labs  01/26/15 1138 03/13/15 1100 03/28/15 0421   NA 136 137 134*   K 4.7 4.2 4.3   CL 102 103 102   CO2 28 25 26    GLUCOSE 80 93 127*   BUN 26* 22 13   CREATININE 1.16 1.20* 0.83   CALCIUM 10.2 9.7 8.6     Liver Function Tests:  Recent Labs  01/26/15 1138 03/13/15 1100   AST 13 13   ALT 12 14   ALKPHOS 72 60   BILITOT 0.3 0.4   PROT 7.0 6.6   ALBUMIN 4.2 3.6     CBC:  Recent Labs     01/26/15 1138 03/13/15 1100      WBC 7.9 8.3  < >     NEUTROABS 4.6 5.1  --      HGB 12.3 12.8  < >     HCT 36.7 39.1  < >     MCV 84.3 86.1  < >     PLT 240.0 231  < >     < > = values in this interval not displayed.   ASSESSMENT/PLAN:  Osteoarthritis S/P right total knee arthroplasty - for rehabilitation; RLE WBAT; continue Eliquis 2.5 mg by mouth every 12 hours for DVT prophylaxis; OxyIR 5 mg 1-2 tabs by mouth every 4 hours when necessary for pain  Hypertension - well controlled; continue lisinopril 20 mg 1 tab by mouth daily  GERD - continue omeprazole 40 mg 1 capsule by mouth twice a day  Constipation - continue MiraLAX 17 g by mouth daily when necessary  Anemia, acute blood loss - hemoglobin 9.8; will monitor    Goals of care:  Short-term rehabilitation    [facility], NP [facility] [phone]

## 2015-08-21 ENCOUNTER — Encounter: Payer: Self-pay | Admitting: Family Medicine

## 2015-08-21 ENCOUNTER — Ambulatory Visit (INDEPENDENT_AMBULATORY_CARE_PROVIDER_SITE_OTHER): Payer: Medicare Other | Admitting: Family Medicine

## 2015-08-21 VITALS — BP 136/80 | HR 72 | Temp 98.2°F | Ht 62.0 in | Wt 173.0 lb

## 2015-08-21 DIAGNOSIS — J019 Acute sinusitis, unspecified: Secondary | ICD-10-CM | POA: Diagnosis not present

## 2015-08-21 MED ORDER — LEVOFLOXACIN 500 MG PO TABS: 500.0000 mg | ORAL_TABLET | Freq: Every day | ORAL | Status: AC

## 2015-08-21 MED ORDER — HYDROCODONE-HOMATROPINE 5-1.5 MG/5ML PO SYRP: 5.0000 mL | ORAL_SOLUTION | ORAL | Status: DC | PRN

## 2015-08-21 NOTE — Progress Notes (Signed)
Pre visit review using our clinic review tool, if applicable. No additional management support is needed unless otherwise documented below in the visit note. 

## 2015-08-21 NOTE — Progress Notes (Signed)
   Subjective:    Patient ID: April [name], female    DOB: 08-06-1929, 81 y.o.   MRN: [id number]  HPI Here for 5 days of sinus pressure, PND, ST, and a dry cough. No fever. Drinking fluids and taking Mucinex.    Review of Systems  Constitutional: Negative.   HENT: Positive for congestion, postnasal drip and sinus pressure.   Eyes: Negative.   Respiratory: Positive for cough.        Objective:   Physical Exam  Constitutional: She appears well-developed and well-nourished.  HENT:  Right Ear: External ear normal.  Left Ear: External ear normal.  Nose: Nose normal.  Mouth/Throat: Oropharynx is clear and moist.  Eyes: Conjunctivae are normal.  Neck: No thyromegaly present.  Pulmonary/Chest: Effort normal and breath sounds normal.  Lymphadenopathy:    She has no cervical adenopathy.          Assessment & Plan:  Sinusitis, treat with Levaquin.

## 2015-08-31 DIAGNOSIS — M25571 Pain in right ankle and joints of right foot: Secondary | ICD-10-CM | POA: Diagnosis not present

## 2015-09-08 ENCOUNTER — Encounter: Payer: Self-pay | Admitting: Family Medicine

## 2015-09-08 ENCOUNTER — Ambulatory Visit (INDEPENDENT_AMBULATORY_CARE_PROVIDER_SITE_OTHER): Payer: Medicare Other | Admitting: Family Medicine

## 2015-09-08 VITALS — BP 137/66 | HR 65 | Temp 98.5°F | Ht 62.0 in | Wt 170.0 lb

## 2015-09-08 DIAGNOSIS — B349 Viral infection, unspecified: Secondary | ICD-10-CM | POA: Diagnosis not present

## 2015-09-08 DIAGNOSIS — R350 Frequency of micturition: Secondary | ICD-10-CM

## 2015-09-08 LAB — POCT URINALYSIS DIPSTICK
BILIRUBIN UA: NEGATIVE
Blood, UA: NEGATIVE
GLUCOSE UA: NEGATIVE
KETONES UA: NEGATIVE
Leukocytes, UA: NEGATIVE
Nitrite, UA: NEGATIVE
PROTEIN UA: NEGATIVE
SPEC GRAV UA: 1.015
Urobilinogen, UA: 0.2
pH, UA: 6.5

## 2015-09-08 NOTE — Progress Notes (Signed)
   Subjective:    Patient ID: April [name], female    DOB: 18-Dec-1928, 81 y.o.   MRN: [id number]  HPI Here for several days of weakness, low grade fever, and urgency to urinate. No burning. She recently finished a round of Levaquin for a sinus infection and this seems to be resolved.    Review of Systems  Constitutional: Positive for fever and fatigue. Negative for chills and diaphoresis.  HENT: Negative.   Eyes: Negative.   Respiratory: Negative.   Cardiovascular: Negative.   Gastrointestinal: Negative.   Genitourinary: Positive for urgency and frequency. Negative for dysuria, flank pain and pelvic pain.       Objective:   Physical Exam  Constitutional: She appears well-developed and well-nourished. No distress.  Neck: No thyromegaly present.  Cardiovascular: Normal rate, regular rhythm, normal heart sounds and intact distal pulses.   Pulmonary/Chest: Effort normal and breath sounds normal.  Abdominal: Soft. Bowel sounds are normal. She exhibits no distension and no mass. There is no tenderness. There is no rebound and no guarding.  Lymphadenopathy:    She has no cervical adenopathy.          Assessment & Plan:  Her symptoms are non-specific and appear to be from a viral illness. Se will rest and drink fluids. Recheck prn

## 2015-09-08 NOTE — Progress Notes (Signed)
Pre visit review using our clinic review tool, if applicable. No additional management support is needed unless otherwise documented below in the visit note. 

## 2015-09-15 DIAGNOSIS — Z23 Encounter for immunization: Secondary | ICD-10-CM | POA: Diagnosis not present

## 2015-09-24 DIAGNOSIS — M25561 Pain in right knee: Secondary | ICD-10-CM | POA: Diagnosis not present

## 2015-10-05 ENCOUNTER — Other Ambulatory Visit: Payer: Self-pay | Admitting: Family Medicine

## 2015-11-02 ENCOUNTER — Ambulatory Visit (INDEPENDENT_AMBULATORY_CARE_PROVIDER_SITE_OTHER): Payer: Medicare Other | Admitting: Family Medicine

## 2015-11-02 ENCOUNTER — Encounter: Payer: Self-pay | Admitting: Family Medicine

## 2015-11-02 VITALS — BP 124/59 | HR 73 | Temp 98.5°F | Ht 62.0 in | Wt 175.0 lb

## 2015-11-02 DIAGNOSIS — J019 Acute sinusitis, unspecified: Secondary | ICD-10-CM | POA: Diagnosis not present

## 2015-11-02 MED ORDER — LEVOFLOXACIN 500 MG PO TABS: 500.0000 mg | ORAL_TABLET | Freq: Every day | ORAL | Status: AC

## 2015-11-02 NOTE — Progress Notes (Signed)
Pre visit review using our clinic review tool, if applicable. No additional management support is needed unless otherwise documented below in the visit note. 

## 2015-11-02 NOTE — Progress Notes (Signed)
   Subjective:    Patient ID: April [name], female    DOB: June 14, 1929, 81 y.o.   MRN: [id number]  HPI Here for 3 days of sinus pressure, PND, ST, and a dry cough. No fever.    Review of Systems  Constitutional: Negative.   HENT: Positive for congestion, postnasal drip, sinus pressure and sore throat.   Eyes: Negative.   Respiratory: Positive for cough.        Objective:   Physical Exam  Constitutional: She appears well-developed and well-nourished.  HENT:  Right Ear: External ear normal.  Left Ear: External ear normal.  Nose: Nose normal.  Mouth/Throat: Oropharynx is clear and moist.  Eyes: Conjunctivae are normal.  Pulmonary/Chest: Effort normal and breath sounds normal.  Lymphadenopathy:    She has no cervical adenopathy.          Assessment & Plan:  Sinusitis, treat with Levaquin.

## 2015-12-09 DIAGNOSIS — N3281 Overactive bladder: Secondary | ICD-10-CM | POA: Diagnosis not present

## 2015-12-09 DIAGNOSIS — D49511 Neoplasm of unspecified behavior of right kidney: Secondary | ICD-10-CM | POA: Diagnosis not present

## 2015-12-28 ENCOUNTER — Other Ambulatory Visit: Payer: Self-pay | Admitting: Family Medicine

## 2016-01-12 ENCOUNTER — Encounter: Payer: Self-pay | Admitting: Family Medicine

## 2016-01-12 ENCOUNTER — Ambulatory Visit (INDEPENDENT_AMBULATORY_CARE_PROVIDER_SITE_OTHER): Payer: Medicare Other | Admitting: Family Medicine

## 2016-01-12 VITALS — BP 142/64 | HR 70 | Temp 98.3°F | Ht 62.0 in | Wt 176.0 lb

## 2016-01-12 DIAGNOSIS — J019 Acute sinusitis, unspecified: Secondary | ICD-10-CM

## 2016-01-12 MED ORDER — LEVOFLOXACIN 500 MG PO TABS: 500.0000 mg | ORAL_TABLET | Freq: Every day | ORAL | Status: AC

## 2016-01-12 NOTE — Progress Notes (Signed)
   Subjective:    Patient ID: April [name], female    DOB: 1928-11-29, 82 y.o.   MRN: [id number]  HPI Here for 6 days of sinus pressure, PND, and a dry cough. No fever.    Review of Systems  Constitutional: Negative.   HENT: Positive for congestion, postnasal drip and sinus pressure. Negative for ear pain and sore throat.   Eyes: Negative.   Respiratory: Positive for cough. Negative for chest tightness, shortness of breath and wheezing.   Cardiovascular: Negative.        Objective:   Physical Exam  Constitutional: She appears well-developed and well-nourished.  HENT:  Right Ear: External ear normal.  Left Ear: External ear normal.  Nose: Nose normal.  Mouth/Throat: Oropharynx is clear and moist.  Eyes: Conjunctivae are normal.  Neck: No thyromegaly present.  Cardiovascular: Normal rate, regular rhythm, normal heart sounds and intact distal pulses.   Pulmonary/Chest: Effort normal and breath sounds normal. No respiratory distress. She has no wheezes. She has no rales.  Lymphadenopathy:    She has no cervical adenopathy.          Assessment & Plan:  Sinusitis, treat with Levaquin

## 2016-01-12 NOTE — Progress Notes (Signed)
Pre visit review using our clinic review tool, if applicable. No additional management support is needed unless otherwise documented below in the visit note. 

## 2016-01-25 ENCOUNTER — Telehealth: Payer: Self-pay | Admitting: Family Medicine

## 2016-01-25 NOTE — Telephone Encounter (Signed)
She just finished a course of Levaquin, so this time call in a Zpack

## 2016-01-25 NOTE — Telephone Encounter (Signed)
Pt said she has a sinus infection and is asking if Dr Sarajane Jews will call her in something   [facility]

## 2016-01-26 ENCOUNTER — Encounter: Payer: Self-pay | Admitting: Adult Health

## 2016-01-26 ENCOUNTER — Ambulatory Visit (INDEPENDENT_AMBULATORY_CARE_PROVIDER_SITE_OTHER): Payer: Medicare Other | Admitting: Adult Health

## 2016-01-26 VITALS — BP 100/60 | HR 82 | Temp 98.2°F | Ht 62.0 in | Wt 173.3 lb

## 2016-01-26 DIAGNOSIS — J0110 Acute frontal sinusitis, unspecified: Secondary | ICD-10-CM

## 2016-01-26 MED ORDER — AZITHROMYCIN 250 MG PO TABS: ORAL_TABLET | ORAL | Status: DC

## 2016-01-26 NOTE — Progress Notes (Signed)
Pre visit review using our clinic review tool, if applicable. No additional management support is needed unless otherwise documented below in the visit note. 

## 2016-01-26 NOTE — Progress Notes (Signed)
   Subjective:    Patient ID: April [name], female    DOB: 01/05/1929, 82 y.o.   MRN: [id number]  HPI  82 year old female who presents to the office of today for 3 days of sinus pain and pressure, fever, cough, rhinorrhea, and sore throat.   Dr. Sarajane Jews actually called in a prescription for this patient yesterday.     Review of Systems     Objective:   Physical Exam        Assessment & Plan:  Dr. Sarajane Jews called in a prescription for a Z pack. I will not change the patient for this visit.

## 2016-01-26 NOTE — Telephone Encounter (Signed)
Rx sent to pharmacy   

## 2016-02-01 DIAGNOSIS — H52203 Unspecified astigmatism, bilateral: Secondary | ICD-10-CM | POA: Diagnosis not present

## 2016-02-01 DIAGNOSIS — H1859 Other hereditary corneal dystrophies: Secondary | ICD-10-CM | POA: Diagnosis not present

## 2016-02-01 DIAGNOSIS — H353132 Nonexudative age-related macular degeneration, bilateral, intermediate dry stage: Secondary | ICD-10-CM | POA: Diagnosis not present

## 2016-02-01 DIAGNOSIS — H04123 Dry eye syndrome of bilateral lacrimal glands: Secondary | ICD-10-CM | POA: Diagnosis not present

## 2016-03-09 ENCOUNTER — Encounter: Payer: Self-pay | Admitting: Family Medicine

## 2016-03-09 ENCOUNTER — Ambulatory Visit (INDEPENDENT_AMBULATORY_CARE_PROVIDER_SITE_OTHER): Payer: Medicare Other | Admitting: Family Medicine

## 2016-03-09 VITALS — BP 130/62 | HR 82 | Temp 98.4°F | Wt 173.8 lb

## 2016-03-09 DIAGNOSIS — J069 Acute upper respiratory infection, unspecified: Secondary | ICD-10-CM | POA: Diagnosis not present

## 2016-03-09 DIAGNOSIS — J309 Allergic rhinitis, unspecified: Secondary | ICD-10-CM | POA: Diagnosis not present

## 2016-03-09 DIAGNOSIS — R062 Wheezing: Secondary | ICD-10-CM | POA: Diagnosis not present

## 2016-03-09 MED ORDER — FLUTICASONE PROPIONATE 50 MCG/ACT NA SUSP: 1.0000 | Freq: Two times a day (BID) | NASAL | Status: DC

## 2016-03-09 NOTE — Progress Notes (Signed)
Pre visit review using our clinic review tool, if applicable. No additional management support is needed unless otherwise documented below in the visit note. 

## 2016-03-09 NOTE — Progress Notes (Signed)
Subjective:    Patient ID: April [name], female    DOB: 20-Jun-1929, 82 y.o.   MRN: [id number]  HPI   Ms.   April [name] is a 82 y.o.female here today complaining of 2 days of respiratory symptoms. She has has nasal congestion, rhinorrhea, and post nasal drainage. She states that she has these symptoms intermittently and would like a Rx for Z pack.  No Hx of recent travel, planning on flying to [place] this weekend. No sick contact. No known insect bite. Denies Hx of allergies but listed on her problem list she has allergic rhinitis. She has no had fever, chills, body aches, dyspnea, chest pain, abdominal pain, nausea, vomiting, or rash. + Wheezing, "a little", she has Albuterol inh , which she uses as needed for wheezing, denies any Hx of asthma and no tobacco use. Mild odynophagia. + Non productive cough, "a little." She has not tried OTC Symptoms otherwise stable.   Review of Systems  Constitutional: Negative for fever, activity change, appetite change, fatigue and unexpected weight change.  HENT: Positive for congestion, postnasal drip, rhinorrhea and sore throat. Negative for ear pain, facial swelling, mouth sores, nosebleeds, sinus pressure, sneezing, trouble swallowing and voice change.   Eyes: Negative for discharge, redness and itching.  Respiratory: Positive for cough. Negative for chest tightness, shortness of breath and wheezing.   Cardiovascular: Negative for chest pain, palpitations and leg swelling.  Gastrointestinal: Negative for nausea, vomiting, abdominal pain and diarrhea.  Musculoskeletal: Negative for myalgias and neck pain.       No associated arthralgias or back pain.  Skin: Negative for rash.  Allergic/Immunologic: Positive for environmental allergies (Hx of rhinitis).  Neurological: Negative for weakness, numbness and headaches.  Hematological: Negative for adenopathy.     Current Outpatient Prescriptions on File Prior to Visit  Medication Sig Dispense  Refill  . albuterol (PROVENTIL HFA;VENTOLIN HFA) 108 (90 BASE) MCG/ACT inhaler Inhale 2 puffs into the lungs every 4 (four) hours as needed for wheezing or shortness of breath. 1 Inhaler 2  . azithromycin (ZITHROMAX) 250 MG tablet Take as directed. 6 tablet 0  . HYDROcodone-homatropine (HYDROMET) 5-1.5 MG/5ML syrup Take 5 mLs by mouth every 4 (four) hours as needed. 240 mL 0  . hydroxypropyl methylcellulose / hypromellose (ISOPTO TEARS / GONIOVISC) 2.5 % ophthalmic solution Place 1 drop into both eyes as needed for dry eyes.    Marland Kitchen ibuprofen (ADVIL,MOTRIN) 200 MG tablet Take 200 mg by mouth every 6 (six) hours as needed.    Marland Kitchen lisinopril (PRINIVIL,ZESTRIL) 20 MG tablet TAKE 1 TABLET ONCE DAILY. 90 tablet 0  . omeprazole (PRILOSEC) 40 MG capsule TAKE (1) CAPSULE TWICE DAILY. 60 capsule 6  . Polyethyl Glycol-Propyl Glycol (SYSTANE) 0.4-0.3 % SOLN Place 1 drop into both eyes 3 (three) times daily.      No current facility-administered medications on file prior to visit.     Past Medical History  Diagnosis Date  . HYPERTENSION 09/30/2009  . GERD 04/18/2007  . LOW BACK PAIN SYNDROME 07/13/2009  . ROTATOR CUFF SYNDROME 05/06/2010  . BURSITIS 01/04/2008  . COSTOCHONDRITIS 11/05/2007  . ALLERGIC RHINITIS 04/18/2007  . Melanoma ([place])     removed from upper back  . Breast cancer [facility]) 2012     Dr. [name], ER/PR positive L breast  . Hx of radiation therapy 10/26/11 to 12/16/11    L chest wall, L supraclavicular region  . Adenomatous colon polyp 11/2005  . Hiatal hernia   . Internal hemorrhoids   .  Anemia   . Arthritis   . History of pneumonia   . History of gallstones   . Pneumonia   . Bronchitis     taking levaquin now for it.  Still runs a low grade fever at times  . Hypothyroidism     YRS AGO.....THINGS ARE FINE NOW    Social History   Social History  . Marital Status: Married    Spouse Name: N/A  . Number of Children: 2  . Years of Education: N/A   Occupational History  . retired     Social History Main Topics  . Smoking status: Never Smoker   . Smokeless tobacco: Never Used  . Alcohol Use: 0.6 oz/week    1 Standard drinks or equivalent per week     Comment: socially  . Drug Use: No  . Sexual Activity: Not Asked   Other Topics Concern  . None   Social History Narrative    Filed Vitals:   03/09/16 1045  BP: 130/62  Pulse: 82  Temp: 98.4 F (36.9 C)   Body mass index is 31.78 kg/(m^2).       Objective:   Physical Exam  Constitutional: She is oriented to person, place, and time. She appears well-developed and well-nourished.  HENT:  Head: Normocephalic and atraumatic.  Right Ear: External ear normal.  Left Ear: External ear normal.  Nose: Rhinorrhea present. No mucosal edema. Right sinus exhibits no maxillary sinus tenderness and no frontal sinus tenderness. Left sinus exhibits no maxillary sinus tenderness and no frontal sinus tenderness.  Mouth/Throat: Uvula is midline and mucous membranes are normal. No oropharyngeal exudate, posterior oropharyngeal edema or posterior oropharyngeal erythema.  Mild hypertrophic turbinates. Mild post nasal drainage. Clearing her throat frequently during visit. Cerumen excess bilateral, not able to visualize TM's.  Eyes: Conjunctivae are normal.  Cardiovascular: Normal rate and regular rhythm.   No murmur heard. Pulmonary/Chest: Effort normal and breath sounds normal. No respiratory distress. She has no wheezes. She has no rales.  Coughing a couple times during visit, non productive.  Lymphadenopathy:    She has no cervical adenopathy.  Neurological: She is alert and oriented to person, place, and time.  Stable gait assisted with a cane.  Skin: Skin is warm. No rash noted.  Psychiatric: She has a normal mood and affect.  Well groomed, good eye contact.  Nursing note and vitals reviewed.      Assessment & Plan:   April was seen today for sinus problem.  Diagnoses and all orders for this visit:  Wheezing  without diagnosis of asthma Reported by patient. ? COPD/hyperactive airway. Today I do not hear any wheezing, lung auscultation negative. Recommended starting Albuterol inh 2 puff qid x 1 week then as needed.  Allergic rhinitis, unspecified allergic rhinitis type Most symptoms today are suggestive of allergies. I recommended OTC antihistaminic as Allegra 180 mg daily. Flonase nasal spray side effects discussed. F/U as needed. -     fluticasone (FLONASE) 50 MCG/ACT nasal spray; Place 1 spray into both nostrils 2 (two) times daily.  URI, acute Symptoms could also be related to a mild viral illness, explained that abx is not recommended. I really dod not think abx is needed, some adverse effects of abx use discussed. She does not feel like cough is bad enough for cough medication. Monitor for fever. Some measures for ear barotrauma prevention dicussed, decongestants have some side effects and given her age I do not recommended but may help for prevention.Autoinflation maneuvers, chewing  gum or hard candy. Clearly instructed about warning signs.    Ramisa [name] G. Martinique, MD  [facility]. [place] office.

## 2016-03-09 NOTE — Patient Instructions (Addendum)
Things to remember from today's visit:  viral infections are self-limited and treatment is symptomatic. Tylenol and/or Ibuprofen may help with symptoms. Plenty of fluids. Honey may help with cough. Cough and nasal congestion could last a few days and sometimes weeks. Please follow in not any better in 1-2 weeks or if worsening symptoms.  Symptoms may also be caused by allergies. Allegra 180 mg daily might help with symptoms. To prevent pressure changes in your ears from flying I recommend chewing gum, eating hard candy, and time. Ears periodically during the flight.  -Today I did not hear wheezing. Start Albuterol inh 2 puff every 6 hours for 7 days.  Please follow with your doctor as needed.

## 2016-03-28 ENCOUNTER — Other Ambulatory Visit: Payer: Self-pay | Admitting: Family Medicine

## 2016-04-13 ENCOUNTER — Encounter: Payer: Self-pay | Admitting: Family Medicine

## 2016-04-13 ENCOUNTER — Ambulatory Visit (INDEPENDENT_AMBULATORY_CARE_PROVIDER_SITE_OTHER): Payer: Medicare Other | Admitting: Family Medicine

## 2016-04-13 VITALS — BP 148/72 | HR 81 | Temp 98.4°F | Ht 62.0 in | Wt 175.0 lb

## 2016-04-13 DIAGNOSIS — J209 Acute bronchitis, unspecified: Secondary | ICD-10-CM | POA: Diagnosis not present

## 2016-04-13 MED ORDER — HYDROCODONE-HOMATROPINE 5-1.5 MG/5ML PO SYRP: 5.0000 mL | ORAL_SOLUTION | ORAL | Status: DC | PRN

## 2016-04-13 MED ORDER — AZITHROMYCIN 250 MG PO TABS: ORAL_TABLET | ORAL | Status: DC

## 2016-04-13 NOTE — Progress Notes (Signed)
   Subjective:    Patient ID: April [name], female    DOB: [birth year], 82 y.o.   MRN: [id number]  HPI Here for 4 days of chest tightness and coughing up green sputum. Some low grade fever last night.    Review of Systems  Constitutional: Positive for fever.  HENT: Positive for congestion and postnasal drip. Negative for ear pain, sinus pressure and sore throat.   Eyes: Negative.   Respiratory: Positive for cough and chest tightness. Negative for shortness of breath and wheezing.        Objective:   Physical Exam  Constitutional: She appears well-developed and well-nourished.  HENT:  Right Ear: External ear normal.  Left Ear: External ear normal.  Nose: Nose normal.  Mouth/Throat: Oropharynx is clear and moist.  Eyes: Conjunctivae are normal.  Neck: No thyromegaly present.  Cardiovascular: Normal rate, regular rhythm, normal heart sounds and intact distal pulses.   Pulmonary/Chest: Effort normal. No respiratory distress. She has no wheezes. She has no rales.  Scattered rhonchi   Lymphadenopathy:    She has no cervical adenopathy.          Assessment & Plan:  Bronchitis, treat with a Zpack.  Laurey Morale, MD

## 2016-04-13 NOTE — Progress Notes (Signed)
Pre visit review using our clinic review tool, if applicable. No additional management support is needed unless otherwise documented below in the visit note. 

## 2016-04-18 ENCOUNTER — Other Ambulatory Visit: Payer: Self-pay | Admitting: Family Medicine

## 2016-05-04 ENCOUNTER — Telehealth: Payer: Self-pay | Admitting: Family Medicine

## 2016-05-04 DIAGNOSIS — N390 Urinary tract infection, site not specified: Secondary | ICD-10-CM | POA: Diagnosis not present

## 2016-05-04 NOTE — Telephone Encounter (Signed)
Patient Name: April [name]  DOB: Jan 02, 1929    Initial Comment Caller states she feels terrible and has an extremely high fever and is really hot. She says her thermometer is broken.   Nurse Assessment  Nurse: Thad Ranger RN, Denise Date/Time (Eastern Time): 05/04/2016 3:43:20 PM  Confirm and document reason for call. If symptomatic, describe symptoms. You must click the next button to save text entered. ---Caller states she feels terrible and has an extremely high fever and is really hot. She says her thermometer is broken. States she is very dizzy and feels faint. Has nausea but denies CP, SOB, and any upper resp, GI s/s.  Has the patient traveled out of the country within the last 30 days? ---Not Applicable  Does the patient have any new or worsening symptoms? ---Yes  Will a triage be completed? ---Yes  Related visit to physician within the last 2 weeks? ---No  Does the PT have any chronic conditions? (i.e. diabetes, asthma, etc.) ---No  Is this a behavioral health or substance abuse call? ---No     Guidelines    Guideline Title Affirmed Question Affirmed Notes  Dizziness - Lightheadedness [1] Fever > 101 F (38.3 C) AND [2] age > 41    Final Disposition User   See Physician within 4 Hours (or PCP triage) Thad Ranger, RN, Langley Gauss    Comments  Erring on the side of caution and advised pt to be seen within 4 hrs due to pt age, fever but temp unknown and dizzy.  Advised to have another adult drive her to the [facility] and if she can not get a ride to the [facility], call for amb to go ER. Verb understanding.   Referrals  GO TO FACILITY OTHER - SPECIFY   Disagree/Comply: Comply

## 2016-05-04 NOTE — Telephone Encounter (Signed)
Spoke to Dr. Sarajane Jews.  He advised a Z-pak.  No need to visit ED or UC.  Tried to reach the pt.  Left a message for a return call.

## 2016-05-05 NOTE — Telephone Encounter (Signed)
Spoke to the pt.  She states she went to [facility] on [address] [phone]) and was treated with Monurol.  Directions were to dissolve the contents of the package in 3-4 oz of water.  Do not use hot water.  Pt has taken the medication and states she is feeling some better.  Advised to call back if sx are not getting better or worsen.

## 2016-06-14 ENCOUNTER — Other Ambulatory Visit: Payer: Self-pay | Admitting: Family Medicine

## 2016-07-08 ENCOUNTER — Encounter: Payer: Self-pay | Admitting: Family Medicine

## 2016-07-08 ENCOUNTER — Ambulatory Visit (INDEPENDENT_AMBULATORY_CARE_PROVIDER_SITE_OTHER): Payer: Medicare Other | Admitting: Family Medicine

## 2016-07-08 VITALS — BP 124/64 | HR 83 | Temp 98.6°F | Ht 62.0 in | Wt 176.0 lb

## 2016-07-08 DIAGNOSIS — J019 Acute sinusitis, unspecified: Secondary | ICD-10-CM | POA: Diagnosis not present

## 2016-07-08 MED ORDER — AZITHROMYCIN 250 MG PO TABS: ORAL_TABLET | ORAL | 0 refills | Status: DC

## 2016-07-08 NOTE — Progress Notes (Signed)
Pre visit review using our clinic review tool, if applicable. No additional management support is needed unless otherwise documented below in the visit note. 

## 2016-07-08 NOTE — Progress Notes (Signed)
   Subjective:    Patient ID: April [name], female    DOB: [birth year], 82 y.o.   MRN: [id number]  HPI Here for 5 days of sinus pressure, PND, and a dry cough.    Review of Systems  Constitutional: Negative.   HENT: Positive for congestion, postnasal drip, sinus pressure and sore throat.   Eyes: Negative.   Respiratory: Positive for cough.        Objective:   Physical Exam  Constitutional: She appears well-developed and well-nourished.  HENT:  Right Ear: External ear normal.  Left Ear: External ear normal.  Nose: Nose normal.  Mouth/Throat: Oropharynx is clear and moist.  Eyes: Conjunctivae are normal.  Neck: No thyromegaly present.  Pulmonary/Chest: Effort normal and breath sounds normal.  Lymphadenopathy:    She has no cervical adenopathy.          Assessment & Plan:  Sinusitis, given a Zpack.  Laurey Morale, MD

## 2016-07-14 ENCOUNTER — Other Ambulatory Visit: Payer: Self-pay | Admitting: Family Medicine

## 2016-08-13 ENCOUNTER — Other Ambulatory Visit: Payer: Self-pay | Admitting: Family Medicine

## 2016-08-19 ENCOUNTER — Telehealth: Payer: Self-pay | Admitting: Family Medicine

## 2016-08-19 MED ORDER — AZITHROMYCIN 250 MG PO TABS: ORAL_TABLET | ORAL | 0 refills | Status: DC

## 2016-08-19 NOTE — Telephone Encounter (Signed)
Call in a Zpack  ?

## 2016-08-19 NOTE — Telephone Encounter (Signed)
Pt would like to have something called in for her sinus infection.  I told pt she may have to come in she prefer not to due to her son is in town visiting.   Pharm: [facility]

## 2016-08-19 NOTE — Telephone Encounter (Signed)
I sent script e-scribe and spoke with patient.

## 2016-08-25 ENCOUNTER — Encounter: Payer: Self-pay | Admitting: Family Medicine

## 2016-08-25 ENCOUNTER — Ambulatory Visit (INDEPENDENT_AMBULATORY_CARE_PROVIDER_SITE_OTHER): Payer: Medicare Other | Admitting: Family Medicine

## 2016-08-25 VITALS — BP 137/69 | HR 66 | Temp 98.5°F | Ht 62.0 in | Wt 181.0 lb

## 2016-08-25 DIAGNOSIS — J0191 Acute recurrent sinusitis, unspecified: Secondary | ICD-10-CM

## 2016-08-25 MED ORDER — AZITHROMYCIN 250 MG PO TABS: ORAL_TABLET | ORAL | 0 refills | Status: DC

## 2016-08-25 MED ORDER — HYDROCODONE-HOMATROPINE 5-1.5 MG/5ML PO SYRP: 5.0000 mL | ORAL_SOLUTION | ORAL | 0 refills | Status: DC | PRN

## 2016-08-25 NOTE — Progress Notes (Signed)
   Subjective:    Patient ID: April [name], female    DOB: 05/20/1929, 82 y.o.   MRN: [id number]  HPI Here for 3 days of sinus pressure, PND, and coughing up yellow sputum. No fever. She took a Zpack on 07-08-16 for a sinus infection and she seemed to get over that well.    Review of Systems  Constitutional: Negative.   HENT: Positive for congestion, postnasal drip and sinus pressure. Negative for sore throat.   Eyes: Negative.   Respiratory: Positive for cough.        Objective:   Physical Exam  Constitutional: She appears well-developed and well-nourished. No distress.  HENT:  Right Ear: External ear normal.  Left Ear: External ear normal.  Nose: Nose normal.  Mouth/Throat: Oropharynx is clear and moist.  Eyes: Conjunctivae are normal.  Neck: No thyromegaly present.  Pulmonary/Chest: Effort normal and breath sounds normal.  Lymphadenopathy:    She has no cervical adenopathy.          Assessment & Plan:  Sinusitis, treat with a Zpack.  Laurey Morale, MD

## 2016-08-25 NOTE — Progress Notes (Signed)
Pre visit review using our clinic review tool, if applicable. No additional management support is needed unless otherwise documented below in the visit note. 

## 2016-09-06 ENCOUNTER — Ambulatory Visit (INDEPENDENT_AMBULATORY_CARE_PROVIDER_SITE_OTHER): Payer: Medicare Other | Admitting: Family Medicine

## 2016-09-06 ENCOUNTER — Encounter: Payer: Self-pay | Admitting: Family Medicine

## 2016-09-06 VITALS — BP 130/76 | Temp 98.2°F | Ht 62.0 in | Wt 174.0 lb

## 2016-09-06 DIAGNOSIS — J0191 Acute recurrent sinusitis, unspecified: Secondary | ICD-10-CM

## 2016-09-06 MED ORDER — LEVOFLOXACIN 500 MG PO TABS: 500.0000 mg | ORAL_TABLET | Freq: Every day | ORAL | 0 refills | Status: DC

## 2016-09-06 NOTE — Progress Notes (Signed)
   Subjective:    Patient ID: April [name], female    DOB: [birth year], 82 y.o.   MRN: [id number]  HPI Here for another bout of sinus congestion, PND, and a dry cough. No fever. She took a Zpack in August and another one last month for similar symptoms.    Review of Systems  Constitutional: Negative.   HENT: Positive for congestion, postnasal drip and sinus pressure. Negative for sore throat.   Eyes: Negative.   Respiratory: Positive for cough.        Objective:   Physical Exam  Constitutional: She appears well-developed and well-nourished.  HENT:  Right Ear: External ear normal.  Left Ear: External ear normal.  Nose: Nose normal.  Mouth/Throat: Oropharynx is clear and moist.  Eyes: Conjunctivae are normal.  Neck: No thyromegaly present.  Pulmonary/Chest: Effort normal and breath sounds normal.  Lymphadenopathy:    She has no cervical adenopathy.          Assessment & Plan:  Partially treated sinusitis, given 10 days of Levaquin.  Laurey Morale, MD

## 2016-09-06 NOTE — Progress Notes (Signed)
Pre visit review using our clinic review tool, if applicable. No additional management support is needed unless otherwise documented below in the visit note. 

## 2016-09-12 ENCOUNTER — Encounter: Payer: Self-pay | Admitting: Family Medicine

## 2016-09-12 ENCOUNTER — Ambulatory Visit (INDEPENDENT_AMBULATORY_CARE_PROVIDER_SITE_OTHER): Payer: Medicare Other | Admitting: Family Medicine

## 2016-09-12 VITALS — BP 122/64 | HR 86 | Temp 98.3°F | Resp 16 | Ht 63.0 in | Wt 172.2 lb

## 2016-09-12 DIAGNOSIS — B349 Viral infection, unspecified: Secondary | ICD-10-CM

## 2016-09-12 DIAGNOSIS — R6889 Other general symptoms and signs: Secondary | ICD-10-CM | POA: Diagnosis not present

## 2016-09-12 LAB — POCT INFLUENZA A/B
INFLUENZA A, POC: NEGATIVE
Influenza B, POC: NEGATIVE

## 2016-09-12 NOTE — Progress Notes (Signed)
Subjective:    Patient ID: April [name], female    DOB: 1929-02-15, 82 y.o.   MRN: [id number]  HPI  Ms. Underberg is an 82 year old who presents today with chills, nausea with one episode of vomiting and "occasional myalgias" that started one day ago. She reports a decreased appetite but states that she has been able to drink water and eat soft, bland foods. She further reports one episode of dizziness that she states was mild and only occurred with standing that is not present at this time. No report of syncope, urinary symptoms, melena, or hematochezia, chest pain, or palpitations. Denies fever, sweats, cough, SOB, rhinitis, sore throat, post nasal drip, ear pain/pressure, and itchy/water eyes. No history of asthma. History of bronchitis. No sick contact exposure. No influenza vaccine this season.  Levaquin prescribed on 09/06/16 for recurrent sinusitis; patient reports that she stopped taking this one day ago when nausea started.  Retake of BP sitting and standing completed. Sitting 122/64 and standing 120/62. No dizziness reported.  Review of Systems  Constitutional: Positive for chills. Negative for fatigue and fever.  HENT: Negative for congestion, postnasal drip, rhinorrhea, sinus pressure, sneezing and sore throat.   Eyes: Negative for visual disturbance.  Respiratory: Negative for cough and wheezing.   Cardiovascular: Negative for chest pain and palpitations.  Gastrointestinal: Positive for nausea and vomiting. Negative for abdominal pain and diarrhea.  Genitourinary: Negative for dysuria, hematuria and urgency.  Musculoskeletal: Positive for myalgias.  Skin: Negative for rash.  Neurological: Negative for light-headedness and headaches.  Psychiatric/Behavioral:       Denies depressed or anxious mood   Past Medical History:  Diagnosis Date  . Adenomatous colon polyp 11/2005  . ALLERGIC RHINITIS 04/18/2007  . Anemia   . Arthritis   . Breast cancer [facility]) 2012    Dr. [name], ER/PR  positive L breast  . Bronchitis    taking levaquin now for it.  Still runs a low grade fever at times  . BURSITIS 01/04/2008  . COSTOCHONDRITIS 11/05/2007  . GERD 04/18/2007  . Hiatal hernia   . History of gallstones   . History of pneumonia   . Hx of radiation therapy 10/26/11 to 12/16/11   L chest wall, L supraclavicular region  . HYPERTENSION 09/30/2009  . Hypothyroidism    YRS AGO.....THINGS ARE FINE NOW  . Internal hemorrhoids   . LOW BACK PAIN SYNDROME 07/13/2009  . Melanoma ([place])    removed from upper back  . Pneumonia   . ROTATOR CUFF SYNDROME 05/06/2010     Social History   Social History  . Marital status: Married    Spouse name: N/A  . Number of children: 2  . Years of education: N/A   Occupational History  . retired    Social History Main Topics  . Smoking status: Never Smoker  . Smokeless tobacco: Never Used  . Alcohol use 0.6 oz/week    1 Standard drinks or equivalent per week     Comment: socially  . Drug use: No  . Sexual activity: Not on file   Other Topics Concern  . Not on file   Social History Narrative  . No narrative on file    Past Surgical History:  Procedure Laterality Date  . ANKLE SURGERY     right  . BREAST SURGERY  1968   left bx - noncancerous  . CATARACT EXTRACTION     bilateral  . CHOLECYSTECTOMY  1979  . excision of back  melanoma    . JOINT REPLACEMENT     left knee  . MASTECTOMY  08/25/11   bilateral , per Dr. Donne Hazel  . SENTINEL LYMPH NODE BIOPSY  08/25/11   bilateral  . TONSILLECTOMY AND ADENOIDECTOMY    . TOTAL KNEE ARTHROPLASTY Right 03/27/2015   Procedure: TOTAL KNEE ARTHROPLASTY;  Surgeon: [name], MD;  Location: [place];  Service: Orthopedics;  Laterality: Right;    Family History  Problem Relation Age of Onset  . Stroke Father   . Alcohol abuse Sister   . Thyroid cancer Mother   . Retinal detachment Mother   . Diabetes Paternal Uncle   . Cirrhosis Sister     Allergies  Allergen Reactions  . Cefaclor  Hives and Itching    Purple splotches  . Lansoprazole Hives and Itching    Purple splotches, hurting  . Penicillins Hives and Itching    Purple splotches  . Sulfa Drugs Cross Reactors Other (See Comments)    Purple whelps, itch, "just go crazy"    Current Outpatient Prescriptions on File Prior to Visit  Medication Sig Dispense Refill  . albuterol (PROVENTIL HFA;VENTOLIN HFA) 108 (90 BASE) MCG/ACT inhaler Inhale 2 puffs into the lungs every 4 (four) hours as needed for wheezing or shortness of breath. 1 Inhaler 2  . fluticasone (FLONASE) 50 MCG/ACT nasal spray Place 1 spray into both nostrils 2 (two) times daily. 16 g 6  . HYDROcodone-homatropine (HYDROMET) 5-1.5 MG/5ML syrup Take 5 mLs by mouth every 4 (four) hours as needed. 240 mL 0  . hydroxypropyl methylcellulose / hypromellose (ISOPTO TEARS / GONIOVISC) 2.5 % ophthalmic solution Place 1 drop into both eyes as needed for dry eyes.    Marland Kitchen ibuprofen (ADVIL,MOTRIN) 200 MG tablet Take 200 mg by mouth every 6 (six) hours as needed.    Marland Kitchen lisinopril (PRINIVIL,ZESTRIL) 20 MG tablet TAKE 1 TABLET ONCE DAILY. 90 tablet 1  . omeprazole (PRILOSEC) 40 MG capsule TAKE (1) CAPSULE TWICE DAILY. 60 capsule 2  . Polyethyl Glycol-Propyl Glycol (SYSTANE) 0.4-0.3 % SOLN Place 1 drop into both eyes 3 (three) times daily.      No current facility-administered medications on file prior to visit.     BP 120/60 (BP Location: Right Arm, Patient Position: Sitting, Cuff Size: Normal)   Pulse 86   Temp 98.3 F (36.8 C) (Oral)   Resp 16   Ht 5\' 3"  (1.6 m)   Wt 172 lb 3.2 oz (78.1 kg)   SpO2 98%   BMI 30.50 kg/m        Objective:   Physical Exam  Constitutional: She is oriented to person, place, and time.  Elderly, optimally nourished female  HENT:  Right Ear: Tympanic membrane normal.  Left Ear: Tympanic membrane normal.  Nose: No rhinorrhea. Right sinus exhibits no maxillary sinus tenderness and no frontal sinus tenderness. Left sinus exhibits no  maxillary sinus tenderness and no frontal sinus tenderness.  Mouth/Throat: Mucous membranes are normal. No oropharyngeal exudate or posterior oropharyngeal erythema.  Eyes: Pupils are equal, round, and reactive to light. No scleral icterus.  Neck: Neck supple.  Cardiovascular: Normal rate and regular rhythm.   Pulmonary/Chest: Effort normal and breath sounds normal. She has no wheezes. She has no rales.  Abdominal: Soft. Bowel sounds are normal. There is no tenderness.  Lymphadenopathy:    She has no cervical adenopathy.  Neurological: She is alert and oriented to person, place, and time. She has normal strength. Coordination normal.  II-Visual fields grossly  intact. III/IV/VI-Extraocular movements intact. Pupils reactive bilaterally. V/VII-Smile symmetric, equal eyebrow raise, facial sensation intact VIII- Hearing grossly intact XII-midline tongue extension  Romberg negative Ambulates using a cane with a coordinated gait   Skin: Skin is warm and dry. No rash noted.  Psychiatric: She has a normal mood and affect. Her behavior is normal. Judgment and thought content normal.       Assessment & Plan:  1. Viral illness Flu screen negative. Suspect symptoms are viral in origin. Advised patient on supportive measures:  Get rest, drink plenty of fluids, and use tylenol as needed for discomfort. Follow up if fever >100, if symptoms worsen or if symptoms are not improved in 3 days. Patient verbalizes understanding.  No dizziness present today; symptom reported as mild and potentially may be related to viral symptoms. Patient reports that she was not taking Levaquin when she had the one isolated episode of dizziness that was brief and resolved spontaneously. She does not wish to complete course but has had 7 days worth of medication at this time. No orthostatic blood pressure changes noted today.     2. Flu-like symptoms Flu screen negative.  - POC Influenza A/B  Advised patient to follow up  with her PCP as recommended and to continue daily prescribed medications that have been provided to her by Dr. Sarajane Jews.  Delano Metz, FNP-C

## 2016-09-12 NOTE — Patient Instructions (Signed)
Your symptoms are most likely related to a viral illness. Please drink plenty of water so that your urine is pale yellow or clear. Also, get plenty of rest, use tylenol as needed for discomfort and follow up if symptoms do not improve in 3 to 4 days, worsen, or you develop a fever >101.

## 2016-09-22 ENCOUNTER — Other Ambulatory Visit: Payer: Self-pay | Admitting: Family Medicine

## 2016-10-07 ENCOUNTER — Ambulatory Visit: Payer: Medicare Other | Admitting: Family Medicine

## 2016-10-07 ENCOUNTER — Encounter: Payer: Self-pay | Admitting: Internal Medicine

## 2016-10-07 ENCOUNTER — Ambulatory Visit (INDEPENDENT_AMBULATORY_CARE_PROVIDER_SITE_OTHER): Payer: Medicare Other | Admitting: Internal Medicine

## 2016-10-07 VITALS — BP 128/64 | HR 73 | Temp 97.6°F | Wt 176.0 lb

## 2016-10-07 DIAGNOSIS — J069 Acute upper respiratory infection, unspecified: Secondary | ICD-10-CM | POA: Diagnosis not present

## 2016-10-07 DIAGNOSIS — B9789 Other viral agents as the cause of diseases classified elsewhere: Secondary | ICD-10-CM

## 2016-10-07 DIAGNOSIS — I10 Essential (primary) hypertension: Secondary | ICD-10-CM | POA: Diagnosis not present

## 2016-10-07 MED ORDER — HYDROCODONE-HOMATROPINE 5-1.5 MG/5ML PO SYRP: 5.0000 mL | ORAL_SOLUTION | ORAL | 0 refills | Status: DC | PRN

## 2016-10-07 NOTE — Progress Notes (Signed)
Subjective:    Patient ID: April [name], female    DOB: 1929-01-22, 82 y.o.   MRN: [id number]  HPI  82 year old patient who is seen today with a three-day history of nonproductive cough.  No fever, sputum production, shortness of breath, wheezing.  She has been using hydrocodone/homatropine sparingly due to mild sedation.  She has treated hypertension which has been managed with lisinopril  No recent fluticasone or albuterol use  Past Medical History:  Diagnosis Date  . Adenomatous colon polyp 11/2005  . ALLERGIC RHINITIS 04/18/2007  . Anemia   . Arthritis   . Breast cancer [facility]) 2012    Dr. [name], ER/PR positive L breast  . Bronchitis    taking levaquin now for it.  Still runs a low grade fever at times  . BURSITIS 01/04/2008  . COSTOCHONDRITIS 11/05/2007  . GERD 04/18/2007  . Hiatal hernia   . History of gallstones   . History of pneumonia   . Hx of radiation therapy 10/26/11 to 12/16/11   L chest wall, L supraclavicular region  . HYPERTENSION 09/30/2009  . Hypothyroidism    YRS AGO.....THINGS ARE FINE NOW  . Internal hemorrhoids   . LOW BACK PAIN SYNDROME 07/13/2009  . Melanoma ([place])    removed from upper back  . Pneumonia   . ROTATOR CUFF SYNDROME 05/06/2010     Social History   Social History  . Marital status: Married    Spouse name: N/A  . Number of children: 2  . Years of education: N/A   Occupational History  . retired    Social History Main Topics  . Smoking status: Never Smoker  . Smokeless tobacco: Never Used  . Alcohol use 0.6 oz/week    1 Standard drinks or equivalent per week     Comment: socially  . Drug use: No  . Sexual activity: Not on file   Other Topics Concern  . Not on file   Social History Narrative  . No narrative on file    Past Surgical History:  Procedure Laterality Date  . ANKLE SURGERY     right  . BREAST SURGERY  1968   left bx - noncancerous  . CATARACT EXTRACTION     bilateral  . CHOLECYSTECTOMY  1979  . excision  of back melanoma    . JOINT REPLACEMENT     left knee  . MASTECTOMY  08/25/11   bilateral , per Dr. Donne Hazel  . SENTINEL LYMPH NODE BIOPSY  08/25/11   bilateral  . TONSILLECTOMY AND ADENOIDECTOMY    . TOTAL KNEE ARTHROPLASTY Right 03/27/2015   Procedure: TOTAL KNEE ARTHROPLASTY;  Surgeon: [name], MD;  Location: [place];  Service: Orthopedics;  Laterality: Right;    Family History  Problem Relation Age of Onset  . Stroke Father   . Alcohol abuse Sister   . Thyroid cancer Mother   . Retinal detachment Mother   . Diabetes Paternal Uncle   . Cirrhosis Sister     Allergies  Allergen Reactions  . Cefaclor Hives and Itching    Purple splotches  . Lansoprazole Hives and Itching    Purple splotches, hurting  . Penicillins Hives and Itching    Purple splotches  . Sulfa Drugs Cross Reactors Other (See Comments)    Purple whelps, itch, "just go crazy"    Current Outpatient Prescriptions on File Prior to Visit  Medication Sig Dispense Refill  . albuterol (PROVENTIL HFA;VENTOLIN HFA) 108 (90 BASE) MCG/ACT inhaler Inhale 2  puffs into the lungs every 4 (four) hours as needed for wheezing or shortness of breath. 1 Inhaler 2  . fluticasone (FLONASE) 50 MCG/ACT nasal spray Place 1 spray into both nostrils 2 (two) times daily. 16 g 6  . hydroxypropyl methylcellulose / hypromellose (ISOPTO TEARS / GONIOVISC) 2.5 % ophthalmic solution Place 1 drop into both eyes as needed for dry eyes.    Marland Kitchen ibuprofen (ADVIL,MOTRIN) 200 MG tablet Take 200 mg by mouth every 6 (six) hours as needed.    Marland Kitchen lisinopril (PRINIVIL,ZESTRIL) 20 MG tablet TAKE 1 TABLET ONCE DAILY. 90 tablet 0  . omeprazole (PRILOSEC) 40 MG capsule TAKE (1) CAPSULE TWICE DAILY. 60 capsule 2  . Polyethyl Glycol-Propyl Glycol (SYSTANE) 0.4-0.3 % SOLN Place 1 drop into both eyes 3 (three) times daily.      No current facility-administered medications on file prior to visit.     BP 128/64 (BP Location: Left Arm, Patient Position:  Sitting, Cuff Size: Normal)   Pulse 73   Temp 97.6 F (36.4 C) (Oral)   Wt 176 lb (79.8 kg)   SpO2 97%   BMI 31.18 kg/m     Review of Systems  Constitutional: Negative.   HENT: Negative for congestion, dental problem, hearing loss, rhinorrhea, sinus pressure, sore throat and tinnitus.   Eyes: Negative for pain, discharge and visual disturbance.  Respiratory: Positive for cough. Negative for shortness of breath.   Cardiovascular: Negative for chest pain, palpitations and leg swelling.  Gastrointestinal: Negative for abdominal distention, abdominal pain, blood in stool, constipation, diarrhea, nausea and vomiting.  Genitourinary: Negative for difficulty urinating, dysuria, flank pain, frequency, hematuria, pelvic pain, urgency, vaginal bleeding, vaginal discharge and vaginal pain.  Musculoskeletal: Negative for arthralgias, gait problem and joint swelling.  Skin: Negative for rash.  Neurological: Negative for dizziness, syncope, speech difficulty, weakness, numbness and headaches.  Hematological: Negative for adenopathy.  Psychiatric/Behavioral: Negative for agitation, behavioral problems and dysphoric mood. The patient is not nervous/anxious.        Objective:   Physical Exam  Constitutional: She is oriented to person, place, and time. She appears well-developed and well-nourished.  HENT:  Head: Normocephalic.  Right Ear: External ear normal.  Left Ear: External ear normal.  Mouth/Throat: Oropharynx is clear and moist.  Eyes: Conjunctivae and EOM are normal. Pupils are equal, round, and reactive to light.  Neck: Normal range of motion. Neck supple. No thyromegaly present.  Cardiovascular: Normal rate, regular rhythm, normal heart sounds and intact distal pulses.   Pulmonary/Chest: Effort normal and breath sounds normal. No respiratory distress. She has no wheezes. She has no rales.  Abdominal: Soft. Bowel sounds are normal. She exhibits no mass. There is no tenderness.    Musculoskeletal: Normal range of motion.  Lymphadenopathy:    She has no cervical adenopathy.  Neurological: She is alert and oriented to person, place, and time.  Skin: Skin is warm and dry. No rash noted.  Psychiatric: She has a normal mood and affect. Her behavior is normal.          Assessment & Plan:   Viral URI with cough.  Will continue symptomatic treatment Essential hypertension.  No change in therapy.  Follow-up as scheduled  Nyoka Cowden

## 2016-10-07 NOTE — Patient Instructions (Signed)
Acute bronchitis symptoms for less than 10 days are generally not helped by antibiotics.  Take over-the-counter expectorants and cough medications such as  Mucinex DM.  Call if there is no improvement in 5 to 7 days or if  you develop worsening cough, fever, or new symptoms, such as shortness of breath or chest pain.   

## 2016-10-07 NOTE — Progress Notes (Signed)
Pre visit review using our clinic review tool, if applicable. No additional management support is needed unless otherwise documented below in the visit note. 

## 2016-10-26 ENCOUNTER — Telehealth: Payer: Self-pay | Admitting: Family Medicine

## 2016-10-26 MED ORDER — METHYLPREDNISOLONE 4 MG PO TBPK: ORAL_TABLET | ORAL | 0 refills | Status: DC

## 2016-10-26 NOTE — Telephone Encounter (Signed)
This may be an allergic cough where antibiotics will not help. Instead call in a Medrol dose pack.

## 2016-10-26 NOTE — Telephone Encounter (Signed)
° ° °  Pt call to say she still has a cough and it has been 2 weeks. She call to ask if there is something else she can take   [place]

## 2016-10-26 NOTE — Telephone Encounter (Signed)
Rx sent to pharmacy and patient is aware 

## 2016-11-04 ENCOUNTER — Ambulatory Visit: Payer: Medicare Other | Admitting: Family Medicine

## 2016-11-07 ENCOUNTER — Ambulatory Visit (INDEPENDENT_AMBULATORY_CARE_PROVIDER_SITE_OTHER): Payer: Medicare Other | Admitting: Family Medicine

## 2016-11-07 ENCOUNTER — Encounter: Payer: Self-pay | Admitting: Family Medicine

## 2016-11-07 VITALS — BP 127/63 | HR 77 | Temp 98.5°F | Ht 63.0 in | Wt 172.0 lb

## 2016-11-07 DIAGNOSIS — J209 Acute bronchitis, unspecified: Secondary | ICD-10-CM | POA: Diagnosis not present

## 2016-11-07 MED ORDER — HYDROCODONE-HOMATROPINE 5-1.5 MG/5ML PO SYRP: 5.0000 mL | ORAL_SOLUTION | ORAL | 0 refills | Status: DC | PRN

## 2016-11-07 MED ORDER — LEVOFLOXACIN 500 MG PO TABS: 500.0000 mg | ORAL_TABLET | Freq: Every day | ORAL | 0 refills | Status: AC

## 2016-11-07 NOTE — Progress Notes (Signed)
   Subjective:    Patient ID: April [name], female    DOB: [birth year], 82 y.o.   MRN: [id number]  HPI Here for 2 weeks of PND, chest congestion, and a dry cough. No fever. She took a Medrol dose pack over the weekend and this helped a little.    Review of Systems  HENT: Positive for congestion and postnasal drip. Negative for ear pain and sinus pain.   Respiratory: Positive for cough.        Objective:   Physical Exam  Constitutional: She appears well-developed and well-nourished. No distress.  HENT:  Right Ear: External ear normal.  Left Ear: External ear normal.  Nose: Nose normal.  Mouth/Throat: Oropharynx is clear and moist.  Eyes: Conjunctivae are normal.  Neck: No thyromegaly present.  Cardiovascular: Normal rate, regular rhythm, normal heart sounds and intact distal pulses.   Pulmonary/Chest: Effort normal and breath sounds normal. No respiratory distress. She has no wheezes. She has no rales.  Lymphadenopathy:    She has no cervical adenopathy.          Assessment & Plan:  Bronchitis, treat with Levaquin.  Laurey Morale, MD

## 2016-11-07 NOTE — Progress Notes (Signed)
Pre visit review using our clinic review tool, if applicable. No additional management support is needed unless otherwise documented below in the visit note. 

## 2016-11-15 ENCOUNTER — Telehealth: Payer: Self-pay | Admitting: Family Medicine

## 2016-11-15 NOTE — Telephone Encounter (Signed)
[facility] - Client [facility]  Patient Name: April [name]  DOB: May 13, 1929    Initial Comment Caller states she has dizziness, vomiting, chills, weakness    Nurse Assessment      Guidelines    Guideline Title Affirmed Question Affirmed Notes       Final Disposition User   FINAL ATTEMPT MADE - no message left Wynetta Emery, Therapist, sports, [place]

## 2016-11-15 NOTE — Telephone Encounter (Signed)
Spoke with pt and she c/o dizziness, vomiting/nausea, fatigue. Pt is unable to take temp, has no thermometer. Pt has had chills. Symptoms started last night. Pt declined appt as she has no one to bring her at this time. Advised on home care, staying hydrated, avoiding dairy and trying BRAT diet. She will monitor symptoms and if not improving by tomorrow she will call back. Nothing further needed at this time.

## 2016-11-22 ENCOUNTER — Encounter: Payer: Self-pay | Admitting: Adult Health

## 2016-11-22 ENCOUNTER — Ambulatory Visit (INDEPENDENT_AMBULATORY_CARE_PROVIDER_SITE_OTHER): Payer: Medicare Other | Admitting: Adult Health

## 2016-11-22 VITALS — BP 110/60 | Temp 98.0°F | Ht 63.0 in | Wt 173.8 lb

## 2016-11-22 DIAGNOSIS — R05 Cough: Secondary | ICD-10-CM | POA: Diagnosis not present

## 2016-11-22 DIAGNOSIS — R059 Cough, unspecified: Secondary | ICD-10-CM

## 2016-11-22 MED ORDER — BENZONATATE 200 MG PO CAPS: 200.0000 mg | ORAL_CAPSULE | Freq: Three times a day (TID) | ORAL | 2 refills | Status: DC | PRN

## 2016-11-22 MED ORDER — HYDROCODONE-HOMATROPINE 5-1.5 MG/5ML PO SYRP: 5.0000 mL | ORAL_SOLUTION | ORAL | 0 refills | Status: DC | PRN

## 2016-11-22 NOTE — Progress Notes (Signed)
Subjective:    Patient ID: April [name], female    DOB: 11/23/1929, 82 y.o.   MRN: [id number]  Cough  This is a new problem. The current episode started 1 to 4 weeks ago. The problem occurs constantly. The cough is non-productive. Pertinent negatives include no chills, ear congestion, ear pain, headaches, nasal congestion, postnasal drip, rhinorrhea, sore throat, shortness of breath or wheezing. She has tried oral steroids, steroid inhaler and OTC cough suppressant for the symptoms. The treatment provided mild relief. Her past medical history is significant for bronchitis. There is no history of COPD.   She was prescribed a medrol dose pack and hydromet by her PCP about two weeks ago. She reports that the cough syrup helped the best.   Review of Systems  Constitutional: Negative for chills.  HENT: Positive for congestion. Negative for ear pain, postnasal drip, rhinorrhea, sinus pain, sinus pressure and sore throat.   Respiratory: Positive for cough and chest tightness. Negative for apnea, shortness of breath, wheezing and stridor.   Cardiovascular: Negative.   Neurological: Negative for headaches.   Past Medical History:  Diagnosis Date  . Adenomatous colon polyp 11/2005  . ALLERGIC RHINITIS 04/18/2007  . Anemia   . Arthritis   . Breast cancer [facility]) 2012    Dr. [name], ER/PR positive L breast  . Bronchitis    taking levaquin now for it.  Still runs a low grade fever at times  . BURSITIS 01/04/2008  . COSTOCHONDRITIS 11/05/2007  . GERD 04/18/2007  . Hiatal hernia   . History of gallstones   . History of pneumonia   . Hx of radiation therapy 10/26/11 to 12/16/11   L chest wall, L supraclavicular region  . HYPERTENSION 09/30/2009  . Hypothyroidism    YRS AGO.....THINGS ARE FINE NOW  . Internal hemorrhoids   . LOW BACK PAIN SYNDROME 07/13/2009  . Melanoma ([place])    removed from upper back  . Pneumonia   . ROTATOR CUFF SYNDROME 05/06/2010    Social History   Social History  . Marital  status: Married    Spouse name: N/A  . Number of children: 2  . Years of education: N/A   Occupational History  . retired    Social History Main Topics  . Smoking status: Never Smoker  . Smokeless tobacco: Never Used  . Alcohol use 0.6 oz/week    1 Standard drinks or equivalent per week     Comment: socially  . Drug use: No  . Sexual activity: Not on file   Other Topics Concern  . Not on file   Social History Narrative  . No narrative on file    Past Surgical History:  Procedure Laterality Date  . ANKLE SURGERY     right  . BREAST SURGERY  1968   left bx - noncancerous  . CATARACT EXTRACTION     bilateral  . CHOLECYSTECTOMY  1979  . excision of back melanoma    . JOINT REPLACEMENT     left knee  . MASTECTOMY  08/25/11   bilateral , per Dr. Donne Hazel  . SENTINEL LYMPH NODE BIOPSY  08/25/11   bilateral  . TONSILLECTOMY AND ADENOIDECTOMY    . TOTAL KNEE ARTHROPLASTY Right 03/27/2015   Procedure: TOTAL KNEE ARTHROPLASTY;  Surgeon: [name], MD;  Location: [place];  Service: Orthopedics;  Laterality: Right;    Family History  Problem Relation Age of Onset  . Stroke Father   . Alcohol abuse Sister   .  Thyroid cancer Mother   . Retinal detachment Mother   . Diabetes Paternal Uncle   . Cirrhosis Sister     Allergies  Allergen Reactions  . Cefaclor Hives and Itching    Purple splotches  . Lansoprazole Hives and Itching    Purple splotches, hurting  . Penicillins Hives and Itching    Purple splotches  . Sulfa Drugs Cross Reactors Other (See Comments)    Purple whelps, itch, "just go crazy"    Current Outpatient Prescriptions on File Prior to Visit  Medication Sig Dispense Refill  . albuterol (PROVENTIL HFA;VENTOLIN HFA) 108 (90 BASE) MCG/ACT inhaler Inhale 2 puffs into the lungs every 4 (four) hours as needed for wheezing or shortness of breath. 1 Inhaler 2  . fluticasone (FLONASE) 50 MCG/ACT nasal spray Place 1 spray into both nostrils 2 (two) times  daily. 16 g 6  . hydroxypropyl methylcellulose / hypromellose (ISOPTO TEARS / GONIOVISC) 2.5 % ophthalmic solution Place 1 drop into both eyes as needed for dry eyes.    Marland Kitchen ibuprofen (ADVIL,MOTRIN) 200 MG tablet Take 200 mg by mouth every 6 (six) hours as needed.    Marland Kitchen lisinopril (PRINIVIL,ZESTRIL) 20 MG tablet TAKE 1 TABLET ONCE DAILY. 90 tablet 0  . methylPREDNISolone (MEDROL DOSEPAK) 4 MG TBPK tablet Use as directed. 21 tablet 0  . omeprazole (PRILOSEC) 40 MG capsule TAKE (1) CAPSULE TWICE DAILY. 60 capsule 2  . Polyethyl Glycol-Propyl Glycol (SYSTANE) 0.4-0.3 % SOLN Place 1 drop into both eyes 3 (three) times daily.      No current facility-administered medications on file prior to visit.     BP 110/60 (BP Location: Left Arm, Patient Position: Sitting, Cuff Size: Normal)   Temp 98 F (36.7 C) (Oral)   Ht 5\' 3"  (1.6 m)   Wt 173 lb 12.8 oz (78.8 kg)   BMI 30.79 kg/m       Objective:   Physical Exam  Constitutional: She is oriented to person, place, and time. She appears well-developed and well-nourished. No distress.  HENT:  Head: Normocephalic and atraumatic.  Right Ear: External ear normal.  Left Ear: External ear normal.  Nose: Nose normal.  Mouth/Throat: Oropharynx is clear and moist. No oropharyngeal exudate.  Eyes: Conjunctivae and EOM are normal. Pupils are equal, round, and reactive to light. Right eye exhibits no discharge. Left eye exhibits no discharge. Scleral icterus is present.  Cardiovascular: Normal rate, regular rhythm, normal heart sounds and intact distal pulses.  Exam reveals no gallop and no friction rub.   No murmur heard. Pulmonary/Chest: Effort normal and breath sounds normal. No respiratory distress. She has no wheezes. She has no rales. She exhibits no tenderness.  Neurological: She is alert and oriented to person, place, and time.  Skin: Skin is warm and dry. No rash noted. She is not diaphoretic. No erythema. No pallor.  Psychiatric: She has a normal  mood and affect. Her behavior is normal. Judgment and thought content normal.  Nursing note and vitals reviewed.     Assessment & Plan:  1. Cough - No signs of pneumonia or bronchitis. Appears to be viral in nature  - benzonatate (TESSALON) 200 MG capsule; Take 1 capsule (200 mg total) by mouth 3 (three) times daily as needed for cough.  Dispense: 20 capsule; Refill: 2 - HYDROcodone-homatropine (HYDROMET) 5-1.5 MG/5ML syrup; Take 5 mLs by mouth every 4 (four) hours as needed.  Dispense: 240 mL; Refill: 0 - Follow up with PCP if no improvement   Tommi Rumps  Webb Weed, NP

## 2016-11-22 NOTE — Progress Notes (Signed)
Pre visit review using our clinic review tool, if applicable. No additional management support is needed unless otherwise documented below in the visit note. 

## 2016-11-24 ENCOUNTER — Other Ambulatory Visit: Payer: Self-pay | Admitting: Family Medicine

## 2016-12-02 ENCOUNTER — Ambulatory Visit (INDEPENDENT_AMBULATORY_CARE_PROVIDER_SITE_OTHER): Payer: Medicare Other | Admitting: Adult Health

## 2016-12-02 ENCOUNTER — Encounter: Payer: Self-pay | Admitting: Adult Health

## 2016-12-02 VITALS — BP 152/76 | Temp 98.1°F | Ht 63.0 in | Wt 173.0 lb

## 2016-12-02 DIAGNOSIS — J209 Acute bronchitis, unspecified: Secondary | ICD-10-CM | POA: Diagnosis not present

## 2016-12-02 MED ORDER — HYDROCODONE-HOMATROPINE 5-1.5 MG/5ML PO SYRP: 5.0000 mL | ORAL_SOLUTION | ORAL | 0 refills | Status: DC | PRN

## 2016-12-02 MED ORDER — AZITHROMYCIN 250 MG PO TABS: ORAL_TABLET | ORAL | 0 refills | Status: DC

## 2016-12-02 MED ORDER — PREDNISONE 10 MG PO TABS: ORAL_TABLET | ORAL | 0 refills | Status: DC

## 2016-12-02 NOTE — Progress Notes (Signed)
Subjective:    Patient ID: April [name], female    DOB: 04/14/1929, 83 y.o.   MRN: [id number]  HPI  83 year old female who presents to the office for continued cough. I last saw her on 11/22/2016 and she was prescribed Tessalon Pearls and Hydromet. She reports that this helped with her cough but that it only worked for a few days. She continues to have a semi productive cough. Denies any fevers or feeling ill.     Review of Systems  Constitutional: Negative.   HENT: Negative.   Respiratory: Positive for cough and shortness of breath. Negative for wheezing.   Cardiovascular: Negative.   Neurological: Negative.   All other systems reviewed and are negative.  Past Medical History:  Diagnosis Date  . Adenomatous colon polyp 11/2005  . ALLERGIC RHINITIS 04/18/2007  . Anemia   . Arthritis   . Breast cancer [facility]) 2012    Dr. [name], ER/PR positive L breast  . Bronchitis    taking levaquin now for it.  Still runs a low grade fever at times  . BURSITIS 01/04/2008  . COSTOCHONDRITIS 11/05/2007  . GERD 04/18/2007  . Hiatal hernia   . History of gallstones   . History of pneumonia   . Hx of radiation therapy 10/26/11 to 12/16/11   L chest wall, L supraclavicular region  . HYPERTENSION 09/30/2009  . Hypothyroidism    YRS AGO.....THINGS ARE FINE NOW  . Internal hemorrhoids   . LOW BACK PAIN SYNDROME 07/13/2009  . Melanoma ([place])    removed from upper back  . Pneumonia   . ROTATOR CUFF SYNDROME 05/06/2010    Social History   Social History  . Marital status: Married    Spouse name: N/A  . Number of children: 2  . Years of education: N/A   Occupational History  . retired    Social History Main Topics  . Smoking status: Never Smoker  . Smokeless tobacco: Never Used  . Alcohol use 0.6 oz/week    1 Standard drinks or equivalent per week     Comment: socially  . Drug use: No  . Sexual activity: Not on file   Other Topics Concern  . Not on file   Social History Narrative  . No  narrative on file    Past Surgical History:  Procedure Laterality Date  . ANKLE SURGERY     right  . BREAST SURGERY  1968   left bx - noncancerous  . CATARACT EXTRACTION     bilateral  . CHOLECYSTECTOMY  1979  . excision of back melanoma    . JOINT REPLACEMENT     left knee  . MASTECTOMY  08/25/11   bilateral , per Dr. Donne Hazel  . SENTINEL LYMPH NODE BIOPSY  08/25/11   bilateral  . TONSILLECTOMY AND ADENOIDECTOMY    . TOTAL KNEE ARTHROPLASTY Right 03/27/2015   Procedure: TOTAL KNEE ARTHROPLASTY;  Surgeon: [name], MD;  Location: [place];  Service: Orthopedics;  Laterality: Right;    Family History  Problem Relation Age of Onset  . Stroke Father   . Alcohol abuse Sister   . Thyroid cancer Mother   . Retinal detachment Mother   . Diabetes Paternal Uncle   . Cirrhosis Sister     Allergies  Allergen Reactions  . Cefaclor Hives and Itching    Purple splotches  . Lansoprazole Hives and Itching    Purple splotches, hurting  . Penicillins Hives and Itching  Purple splotches  . Sulfa Drugs Cross Reactors Other (See Comments)    Purple whelps, itch, "just go crazy"    Current Outpatient Prescriptions on File Prior to Visit  Medication Sig Dispense Refill  . albuterol (PROVENTIL HFA;VENTOLIN HFA) 108 (90 BASE) MCG/ACT inhaler Inhale 2 puffs into the lungs every 4 (four) hours as needed for wheezing or shortness of breath. 1 Inhaler 2  . benzonatate (TESSALON) 200 MG capsule Take 1 capsule (200 mg total) by mouth 3 (three) times daily as needed for cough. 20 capsule 2  . fluticasone (FLONASE) 50 MCG/ACT nasal spray Place 1 spray into both nostrils 2 (two) times daily. 16 g 6  . hydroxypropyl methylcellulose / hypromellose (ISOPTO TEARS / GONIOVISC) 2.5 % ophthalmic solution Place 1 drop into both eyes as needed for dry eyes.    Marland Kitchen ibuprofen (ADVIL,MOTRIN) 200 MG tablet Take 200 mg by mouth every 6 (six) hours as needed.    Marland Kitchen lisinopril (PRINIVIL,ZESTRIL) 20 MG tablet TAKE  1 TABLET ONCE DAILY. 90 tablet 0  . omeprazole (PRILOSEC) 40 MG capsule TAKE (1) CAPSULE TWICE DAILY. 60 capsule 1  . Polyethyl Glycol-Propyl Glycol (SYSTANE) 0.4-0.3 % SOLN Place 1 drop into both eyes 3 (three) times daily.      No current facility-administered medications on file prior to visit.     BP (!) 152/76   Temp 98.1 F (36.7 C) (Oral)   Ht 5\' 3"  (1.6 m)   Wt 173 lb (78.5 kg)   BMI 30.65 kg/m        Objective:   Physical Exam  Constitutional: She is oriented to person, place, and time. She appears well-developed and well-nourished. No distress.  HENT:  Head: Normocephalic and atraumatic.  Right Ear: External ear normal.  Left Ear: External ear normal.  Nose: Nose normal.  Mouth/Throat: Oropharynx is clear and moist.  Cardiovascular: Normal rate, regular rhythm, normal heart sounds and intact distal pulses.  Exam reveals no gallop and no friction rub.   No murmur heard. Pulmonary/Chest: Effort normal. No respiratory distress. She has wheezes (trace wheezing in lower lung fields). She has no rales. She exhibits no tenderness.  Neurological: She is alert and oriented to person, place, and time.  Skin: Skin is warm and dry. No rash noted. She is not diaphoretic. No erythema. No pallor.  Psychiatric: She has a normal mood and affect. Her behavior is normal. Judgment and thought content normal.  Nursing note and vitals reviewed.      Assessment & Plan:  1. Acute bronchitis, unspecified organism  - azithromycin (ZITHROMAX Z-PAK) 250 MG tablet; Take 2 tablets on Day 1.  Then take 1 tablet daily.  Dispense: 6 tablet; Refill: 0 - predniSONE (DELTASONE) 10 MG tablet; 40 mg x 3 days, 20 mg x 3 days, 10 mg x 3 days  Dispense: 21 tablet; Refill: 0 - HYDROcodone-homatropine (HYDROMET) 5-1.5 MG/5ML syrup; Take 5 mLs by mouth every 4 (four) hours as needed.  Dispense: 240 mL; Refill: 0 - Follow up with PCP if no improvement    Dorothyann Peng, NP

## 2016-12-09 ENCOUNTER — Ambulatory Visit: Payer: Medicare Other

## 2016-12-13 ENCOUNTER — Encounter: Payer: Self-pay | Admitting: Family Medicine

## 2016-12-13 ENCOUNTER — Ambulatory Visit (INDEPENDENT_AMBULATORY_CARE_PROVIDER_SITE_OTHER)
Admission: RE | Admit: 2016-12-13 | Discharge: 2016-12-13 | Disposition: A | Discharge status: Home / Self Care | Payer: Medicare Other | Source: Ambulatory Visit | Attending: Family Medicine | Admitting: Family Medicine

## 2016-12-13 ENCOUNTER — Ambulatory Visit (INDEPENDENT_AMBULATORY_CARE_PROVIDER_SITE_OTHER): Payer: Medicare Other | Admitting: Family Medicine

## 2016-12-13 VITALS — BP 139/66 | HR 92 | Temp 98.4°F | Ht 63.0 in | Wt 172.0 lb

## 2016-12-13 DIAGNOSIS — R053 Chronic cough: Secondary | ICD-10-CM

## 2016-12-13 DIAGNOSIS — R05 Cough: Secondary | ICD-10-CM | POA: Diagnosis not present

## 2016-12-13 DIAGNOSIS — J181 Lobar pneumonia, unspecified organism: Secondary | ICD-10-CM

## 2016-12-13 DIAGNOSIS — J189 Pneumonia, unspecified organism: Secondary | ICD-10-CM

## 2016-12-13 MED ORDER — CLARITHROMYCIN 500 MG PO TABS: 500.0000 mg | ORAL_TABLET | Freq: Two times a day (BID) | ORAL | 0 refills | Status: DC

## 2016-12-13 MED ORDER — FLUTICASONE PROPIONATE HFA 220 MCG/ACT IN AERO: 2.0000 | INHALATION_SPRAY | Freq: Two times a day (BID) | RESPIRATORY_TRACT | 2 refills | Status: DC

## 2016-12-13 NOTE — Progress Notes (Signed)
   Subjective:    Patient ID: April [name], female    DOB: 1929/03/26, 83 y.o.   MRN: [id number]  HPI Here for continued chest congestion and coughing that started about 6 weeks ago. The cough is usually non-productive. No chest pain or fever. She does feel mildly SOB at times and wheezes at times. She uses her albuterol inhaler once or twice a day. She was seen here on 11-07-16 for this cough and was given Levaquin and a Medrol dose pack with no response. She was then seen on 12-02-16 and was given a Zpack, again with no response. Now the same symptoms persist. Her appetite is good and she is drinking fluids, but she is felling very fatigued.    Review of Systems  Constitutional: Positive for fatigue. Negative for chills, diaphoresis and fever.  Respiratory: Positive for cough, chest tightness, shortness of breath and wheezing.   Cardiovascular: Negative.   Neurological: Negative.        Objective:   Physical Exam  Constitutional: She is oriented to person, place, and time. She appears well-developed and well-nourished. No distress.  Neck: No thyromegaly present.  Cardiovascular: Normal rate, regular rhythm, normal heart sounds and intact distal pulses.   Pulmonary/Chest: Effort normal and breath sounds normal. No respiratory distress. She has no wheezes. She has no rales.  Lymphadenopathy:    She has no cervical adenopathy.  Neurological: She is alert and oriented to person, place, and time.          Assessment & Plan:  Here for 6 weeks of coughing that has not responded to 2 rounds of antibiotics. She was given a Flovent inhaler to use bid. After her visit here she went to get a CXR, and this has shown a LUL infiltrate. She will now be started on 10 days of Biaxin to cover atypical agents. Recheck in a few days.  Alysia Penna, MD

## 2016-12-13 NOTE — Progress Notes (Signed)
Pre visit review using our clinic review tool, if applicable. No additional management support is needed unless otherwise documented below in the visit note. 

## 2016-12-19 ENCOUNTER — Ambulatory Visit (INDEPENDENT_AMBULATORY_CARE_PROVIDER_SITE_OTHER): Payer: Medicare Other | Admitting: Family Medicine

## 2016-12-19 ENCOUNTER — Encounter: Payer: Self-pay | Admitting: Family Medicine

## 2016-12-19 VITALS — BP 132/70 | HR 75 | Temp 98.1°F | Resp 12 | Ht 63.0 in | Wt 169.4 lb

## 2016-12-19 DIAGNOSIS — R05 Cough: Secondary | ICD-10-CM

## 2016-12-19 DIAGNOSIS — R059 Cough, unspecified: Secondary | ICD-10-CM

## 2016-12-19 DIAGNOSIS — K219 Gastro-esophageal reflux disease without esophagitis: Secondary | ICD-10-CM

## 2016-12-19 DIAGNOSIS — J988 Other specified respiratory disorders: Secondary | ICD-10-CM | POA: Diagnosis not present

## 2016-12-19 NOTE — Progress Notes (Signed)
HPI:  ACUTE VISIT:  Chief Complaint  Patient presents with  . Cough    April [name] is a 83 y.o. female, who is here today complaining of 6-7 weeks of cough.   She was seen on 12/13/16, Dx with CAP and started on Clarithromycin, today she is on her  5-6th day. She is concerned because she is still coughing.   Non productive cough.  Denies fever, chill,decreased appetite, or myalgias. She has not noted nasal congestion, rhinorrhea, sore throat, and post nasal drainage.  Denies chest pain or dyspnea. + Wheezing, Albuterol inh today morning. She is reporting prior episodes of wheezing.   No Hx of recent travel. No sick contact.  + Hx of allergic rhinitis. No Hx of tobacco use.  Symptoms otherwise stable.  CXR 12/13/16:New onset left upper lobe mild atelectasis and infiltrate.  Hx of GERD, she is on Omeprazole 40 mg bid.  Denies abdominal pain, nausea, vomiting, or melena.   Review of Systems  Constitutional: Negative for activity change, appetite change, fatigue and fever.  HENT: Negative for congestion, mouth sores, nosebleeds, postnasal drip, sinus pressure, sore throat, trouble swallowing and voice change.   Eyes: Negative for discharge, redness and itching.  Respiratory: Positive for cough and wheezing. Negative for shortness of breath and stridor.   Cardiovascular: Negative for chest pain and palpitations.  Gastrointestinal: Negative for abdominal pain, diarrhea, nausea and vomiting.  Musculoskeletal: Negative for back pain, joint swelling, myalgias and neck pain.  Skin: Negative for rash.  Allergic/Immunologic: Negative for environmental allergies.  Neurological: Negative for syncope, weakness and headaches.  Hematological: Negative for adenopathy.  Psychiatric/Behavioral: Negative for confusion. The patient is nervous/anxious.       Current Outpatient Prescriptions on File Prior to Visit  Medication Sig Dispense Refill  . albuterol (PROVENTIL  HFA;VENTOLIN HFA) 108 (90 BASE) MCG/ACT inhaler Inhale 2 puffs into the lungs every 4 (four) hours as needed for wheezing or shortness of breath. 1 Inhaler 2  . benzonatate (TESSALON) 200 MG capsule Take 1 capsule (200 mg total) by mouth 3 (three) times daily as needed for cough. 20 capsule 2  . clarithromycin (BIAXIN) 500 MG tablet Take 1 tablet (500 mg total) by mouth 2 (two) times daily. 20 tablet 0  . fluticasone (FLONASE) 50 MCG/ACT nasal spray Place 1 spray into both nostrils 2 (two) times daily. 16 g 6  . fluticasone (FLOVENT HFA) 220 MCG/ACT inhaler Inhale 2 puffs into the lungs 2 (two) times daily. 1 Inhaler 2  . HYDROcodone-homatropine (HYDROMET) 5-1.5 MG/5ML syrup Take 5 mLs by mouth every 4 (four) hours as needed. 240 mL 0  . hydroxypropyl methylcellulose / hypromellose (ISOPTO TEARS / GONIOVISC) 2.5 % ophthalmic solution Place 1 drop into both eyes as needed for dry eyes.    Marland Kitchen ibuprofen (ADVIL,MOTRIN) 200 MG tablet Take 200 mg by mouth every 6 (six) hours as needed.    Marland Kitchen lisinopril (PRINIVIL,ZESTRIL) 20 MG tablet TAKE 1 TABLET ONCE DAILY. 90 tablet 0  . omeprazole (PRILOSEC) 40 MG capsule TAKE (1) CAPSULE TWICE DAILY. 60 capsule 1  . Polyethyl Glycol-Propyl Glycol (SYSTANE) 0.4-0.3 % SOLN Place 1 drop into both eyes 3 (three) times daily.     . predniSONE (DELTASONE) 10 MG tablet 40 mg x 3 days, 20 mg x 3 days, 10 mg x 3 days 21 tablet 0   No current facility-administered medications on file prior to visit.      Past Medical History:  Diagnosis Date  .  Adenomatous colon polyp 11/2005  . ALLERGIC RHINITIS 04/18/2007  . Anemia   . Arthritis   . Breast cancer [facility]) 2012    Dr. [name], ER/PR positive L breast  . Bronchitis    taking levaquin now for it.  Still runs a low grade fever at times  . BURSITIS 01/04/2008  . COSTOCHONDRITIS 11/05/2007  . GERD 04/18/2007  . Hiatal hernia   . History of gallstones   . History of pneumonia   . Hx of radiation therapy 10/26/11 to 12/16/11   L  chest wall, L supraclavicular region  . HYPERTENSION 09/30/2009  . Hypothyroidism    YRS AGO.....THINGS ARE FINE NOW  . Internal hemorrhoids   . LOW BACK PAIN SYNDROME 07/13/2009  . Melanoma ([place])    removed from upper back  . Pneumonia   . ROTATOR CUFF SYNDROME 05/06/2010   Allergies  Allergen Reactions  . Cefaclor Hives and Itching    Purple splotches  . Lansoprazole Hives and Itching    Purple splotches, hurting  . Penicillins Hives and Itching    Purple splotches  . Sulfa Drugs Cross Reactors Other (See Comments)    Purple whelps, itch, "just go crazy"    Social History   Social History  . Marital status: Married    Spouse name: N/A  . Number of children: 2  . Years of education: N/A   Occupational History  . retired    Social History Main Topics  . Smoking status: Never Smoker  . Smokeless tobacco: Never Used  . Alcohol use 0.6 oz/week    1 Standard drinks or equivalent per week     Comment: socially  . Drug use: No  . Sexual activity: Not Asked   Other Topics Concern  . None   Social History Narrative  . None    Vitals:   12/19/16 1417  BP: 132/70  Pulse: 75  Resp: 12  Temp: 98.1 F (36.7 C)   O2 sat 95% at RA.  Body mass index is 30 kg/m.   Physical Exam  Nursing note and vitals reviewed. Constitutional: She is oriented to person, place, and time. She appears well-developed. She does not appear ill. No distress.  HENT:  Head: Atraumatic.  Mouth/Throat: Oropharynx is clear and moist and mucous membranes are normal.  Eyes: Conjunctivae and EOM are normal.  Cardiovascular: Normal rate and regular rhythm.   No murmur heard. Respiratory: Effort normal and breath sounds normal. No respiratory distress. She has no wheezes. She has no rales.  Musculoskeletal: She exhibits no edema or tenderness.  Lymphadenopathy:    She has no cervical adenopathy.  Neurological: She is alert and oriented to person, place, and time. She has normal strength.    Skin: Skin is warm. No erythema.  Psychiatric: Her mood appears anxious.  Well groomed, good eye contact.      ASSESSMENT AND PLAN:   April [name] was seen today for cough.  Diagnoses and all orders for this visit:   Cough  We discussed possible causes: residual after respiratory infection, allergies, COPD, and GERD among some. She has had a few CXR's in the past year due to cough,"chronic", 12/26/13 reported pulmonary interstitial fibrosis. Today I do not appreciate wheezing, Albuterol inh 2 puff qid x 1 week then prn recommended.  Gastroesophageal reflux disease, esophagitis presence not specified  This problem could aggravate problem. Continue PPI bid, add Zantac 150 mg at bedtime. GERD precautions discussed. 11/2005 EGD: Hiatal hernia and GERD.   Respiratory tract  infection  Because she has not noted dyspnea, fever,or worsening cough I do not think imaging needs to be repeated. Today lung auscultation negative for rales and wheezing. Multiple abx allergies, for now recommend completing abx treatment. Clearly instructed about warning signs.       Return in about 2 weeks (around 01/02/2017) for PCP.     -Ms.April [name] was advised to return or notify a doctor immediately if symptoms worsen or new concerns arise.       Betty G. Martinique, MD  [facility]. [place] office.

## 2016-12-19 NOTE — Progress Notes (Signed)
Pre visit review using our clinic review tool, if applicable. No additional management support is needed unless otherwise documented below in the visit note. 

## 2016-12-19 NOTE — Patient Instructions (Addendum)
  Piqua have seen you today for an acute visit.  Respiratory infection  Gastroesophageal reflux disease, esophagitis presence not specified Cough  Complete antibiotic treatment. Albuterol inh 2 puff every 6 hours for a week then as needed.  Continue Benzonatate. Cough can last a few weeks and can be aggravated by GERD.  If fever,shortness of breath, sudden worsening symptoms please seek immediate medical attention.   Zantac 150 mg at bedtime.   Avoid foods that make your symptoms worse, for example coffee, chocolate,pepermeint,alcohol, and greasy food. Raising the head of your bed about 6 inches may help with nocturnal symptoms.   Weight loss (if you are overweight). Avoid lying down for 3 hours after eating.  Instead 3 large meals daily try small and more frequent meals during the day.    You should be evaluated immediately if bloody vomiting, bloody stools, black stools (like tar), difficulty swallowing, food gets stuck on the way down or choking when eating.     In general please monitor for signs of worsening symptoms and seek immediate medical attention if any concerning/warning symptom as we discussed.     Please be sure you have an appointment already scheduled with your PCP before you leave today.

## 2016-12-23 ENCOUNTER — Encounter: Payer: Self-pay | Admitting: Family Medicine

## 2016-12-23 ENCOUNTER — Ambulatory Visit (INDEPENDENT_AMBULATORY_CARE_PROVIDER_SITE_OTHER): Payer: Medicare Other | Admitting: Family Medicine

## 2016-12-23 VITALS — BP 108/80 | HR 90 | Temp 97.8°F | Ht 63.0 in | Wt 169.4 lb

## 2016-12-23 DIAGNOSIS — J3089 Other allergic rhinitis: Secondary | ICD-10-CM

## 2016-12-23 DIAGNOSIS — J189 Pneumonia, unspecified organism: Secondary | ICD-10-CM | POA: Diagnosis not present

## 2016-12-23 DIAGNOSIS — R059 Cough, unspecified: Secondary | ICD-10-CM

## 2016-12-23 DIAGNOSIS — K219 Gastro-esophageal reflux disease without esophagitis: Secondary | ICD-10-CM

## 2016-12-23 DIAGNOSIS — R05 Cough: Secondary | ICD-10-CM

## 2016-12-23 MED ORDER — BENZONATATE 100 MG PO CAPS: 100.0000 mg | ORAL_CAPSULE | Freq: Two times a day (BID) | ORAL | 0 refills | Status: DC | PRN

## 2016-12-23 NOTE — Progress Notes (Signed)
HPI:  April [name] is a pleasant 83 yo here for and acute visit for cough. Recent dx CAP on on abx. PMH sig for AR, GERD, chronic cough, chronic PND. Feels well over all, but cough has persisted - now non-productive Tickle in throat from PND and then his causes her to cough She thought this may mean she needs another abx No SOB, CP, fevers, malaise, body aches, chills or mucus production Out of cough medication  ROS: See pertinent positives and negatives per HPI.  Past Medical History:  Diagnosis Date  . Adenomatous colon polyp 11/2005  . ALLERGIC RHINITIS 04/18/2007  . Anemia   . Arthritis   . Breast cancer [facility]) 2012    Dr. [name], ER/PR positive L breast  . Bronchitis    taking levaquin now for it.  Still runs a low grade fever at times  . BURSITIS 01/04/2008  . COSTOCHONDRITIS 11/05/2007  . GERD 04/18/2007  . Hiatal hernia   . History of gallstones   . History of pneumonia   . Hx of radiation therapy 10/26/11 to 12/16/11   L chest wall, L supraclavicular region  . HYPERTENSION 09/30/2009  . Hypothyroidism    YRS AGO.....THINGS ARE FINE NOW  . Internal hemorrhoids   . LOW BACK PAIN SYNDROME 07/13/2009  . Melanoma ([place])    removed from upper back  . Pneumonia   . ROTATOR CUFF SYNDROME 05/06/2010    Past Surgical History:  Procedure Laterality Date  . ANKLE SURGERY     right  . BREAST SURGERY  1968   left bx - noncancerous  . CATARACT EXTRACTION     bilateral  . CHOLECYSTECTOMY  1979  . excision of back melanoma    . JOINT REPLACEMENT     left knee  . MASTECTOMY  08/25/11   bilateral , per Dr. Donne Hazel  . SENTINEL LYMPH NODE BIOPSY  08/25/11   bilateral  . TONSILLECTOMY AND ADENOIDECTOMY    . TOTAL KNEE ARTHROPLASTY Right 03/27/2015   Procedure: TOTAL KNEE ARTHROPLASTY;  Surgeon: [name], MD;  Location: [facility];  Service: Orthopedics;  Laterality: Right;    Family History  Problem Relation Age of Onset  . Stroke Father   . Alcohol abuse Sister   .  Thyroid cancer Mother   . Retinal detachment Mother   . Diabetes Paternal Uncle   . Cirrhosis Sister     Social History   Social History  . Marital status: Married    Spouse name: N/A  . Number of children: 2  . Years of education: N/A   Occupational History  . retired    Social History Main Topics  . Smoking status: Never Smoker  . Smokeless tobacco: Never Used  . Alcohol use 0.6 oz/week    1 Standard drinks or equivalent per week     Comment: socially  . Drug use: No  . Sexual activity: Not Asked   Other Topics Concern  . None   Social History Narrative  . None     Current Outpatient Prescriptions:  .  albuterol (PROVENTIL HFA;VENTOLIN HFA) 108 (90 BASE) MCG/ACT inhaler, Inhale 2 puffs into the lungs every 4 (four) hours as needed for wheezing or shortness of breath., Disp: 1 Inhaler, Rfl: 2 .  clarithromycin (BIAXIN) 500 MG tablet, Take 1 tablet (500 mg total) by mouth 2 (two) times daily., Disp: 20 tablet, Rfl: 0 .  fluticasone (FLONASE) 50 MCG/ACT nasal spray, Place 1 spray into both nostrils 2 (two)  times daily., Disp: 16 g, Rfl: 6 .  fluticasone (FLOVENT HFA) 220 MCG/ACT inhaler, Inhale 2 puffs into the lungs 2 (two) times daily., Disp: 1 Inhaler, Rfl: 2 .  HYDROcodone-homatropine (HYDROMET) 5-1.5 MG/5ML syrup, Take 5 mLs by mouth every 4 (four) hours as needed., Disp: 240 mL, Rfl: 0 .  hydroxypropyl methylcellulose / hypromellose (ISOPTO TEARS / GONIOVISC) 2.5 % ophthalmic solution, Place 1 drop into both eyes as needed for dry eyes., Disp: , Rfl:  .  ibuprofen (ADVIL,MOTRIN) 200 MG tablet, Take 200 mg by mouth every 6 (six) hours as needed., Disp: , Rfl:  .  lisinopril (PRINIVIL,ZESTRIL) 20 MG tablet, TAKE 1 TABLET ONCE DAILY., Disp: 90 tablet, Rfl: 0 .  omeprazole (PRILOSEC) 40 MG capsule, TAKE (1) CAPSULE TWICE DAILY., Disp: 60 capsule, Rfl: 1 .  Polyethyl Glycol-Propyl Glycol (SYSTANE) 0.4-0.3 % SOLN, Place 1 drop into both eyes 3 (three) times daily. , Disp: ,  Rfl:  .  predniSONE (DELTASONE) 10 MG tablet, 40 mg x 3 days, 20 mg x 3 days, 10 mg x 3 days, Disp: 21 tablet, Rfl: 0 .  benzonatate (TESSALON) 100 MG capsule, Take 1 capsule (100 mg total) by mouth 2 (two) times daily as needed for cough., Disp: 20 capsule, Rfl: 0  EXAM:  Vitals:   12/23/16 1354  BP: 108/80  Pulse: 90  Temp: 97.8 F (36.6 C)    Body mass index is 30.01 kg/m.  GENERAL: vitals reviewed and listed above, alert, oriented, appears well hydrated and in no acute distress  HEENT: atraumatic, conjunttiva clear, no obvious abnormalities on inspection of external nose and ears, normal appearance of ear canals and TMs, clear nasal congestion, mild post oropharyngeal erythema with PND, no tonsillar edema or exudate, no sinus TTP  NECK: no obvious masses on inspection  LUNGS: clear to auscultation bilaterally, no wheezes, rales or rhonchi, good air movement  CV: HRRR, no peripheral edema  MS: moves all extremities without noticeable abnormality  PSYCH: pleasant and cooperative, no obvious depression or anxiety  ASSESSMENT AND PLAN:  Discussed the following assessment and plan:  Community acquired pneumonia, unspecified laterality  Chronic non-seasonal allergic rhinitis, unspecified trigger  Gastroesophageal reflux disease, esophagitis presence not specified  Cough  -lungs sound good, she looks great and seems to be improving on biaxin - now only remaining symptom is nonproductive cough (this may be due to pnd rather then the cap - but difficult to tell) -opted to finish course abx, tessalon for cough, close follow up with PCP next week to recheck -of course, we advised in the interim to see a doctor immediately if symptoms worsen at all or new concerns arise.    Patient Instructions  Schedule follow up with PCP in 4-7 days  Tessalon for cough  Complete antibiotic  Seek care if worsening, new concerns, fevers, trouble breathing, coughing up thick mucus, body  aches or chills.   Colin Benton R., DO

## 2016-12-23 NOTE — Progress Notes (Signed)
Pre visit review using our clinic review tool, if applicable. No additional management support is needed unless otherwise documented below in the visit note. 

## 2016-12-23 NOTE — Patient Instructions (Signed)
Schedule follow up with PCP in 4-7 days  Tessalon for cough  Complete antibiotic  Seek care if worsening, new concerns, fevers, trouble breathing, coughing up thick mucus, body aches or chills.

## 2016-12-29 ENCOUNTER — Encounter: Payer: Self-pay | Admitting: Family Medicine

## 2016-12-29 ENCOUNTER — Telehealth: Payer: Self-pay

## 2016-12-29 ENCOUNTER — Ambulatory Visit (INDEPENDENT_AMBULATORY_CARE_PROVIDER_SITE_OTHER): Payer: Medicare Other | Admitting: Family Medicine

## 2016-12-29 VITALS — BP 158/78 | HR 78 | Temp 98.6°F | Ht 63.0 in | Wt 168.0 lb

## 2016-12-29 DIAGNOSIS — J181 Lobar pneumonia, unspecified organism: Secondary | ICD-10-CM

## 2016-12-29 DIAGNOSIS — J189 Pneumonia, unspecified organism: Secondary | ICD-10-CM

## 2016-12-29 NOTE — Telephone Encounter (Signed)
Medication is a benefit exclusion.

## 2016-12-29 NOTE — Progress Notes (Signed)
   Subjective:    Patient ID: April [name], female    DOB: 02/17/1929, 83 y.o.   MRN: [id number]  HPI Here to follow up on a LUL pneumonia that was diagnosed by CXR on 12-13-16. She had been coughing quite a bit prior to that day. She was given 10 days of Biaxin. She feels much better now, the cough has improved, and her energy level has improved. She is using Robitussin as needed.    Review of Systems  Constitutional: Negative.   HENT: Negative.   Eyes: Negative.   Respiratory: Positive for cough. Negative for choking, chest tightness, shortness of breath, wheezing and stridor.   Cardiovascular: Negative.        Objective:   Physical Exam  Constitutional: She appears well-developed and well-nourished.  She looks good, back to her baseline   HENT:  Right Ear: External ear normal.  Left Ear: External ear normal.  Nose: Nose normal.  Mouth/Throat: Oropharynx is clear and moist.  Eyes: Conjunctivae are normal.  Neck: Neck supple. No thyromegaly present.  Cardiovascular: Normal rate, regular rhythm, normal heart sounds and intact distal pulses.   Pulmonary/Chest: Effort normal and breath sounds normal. No respiratory distress. She has no wheezes. She has no rales.  Lymphadenopathy:    She has no cervical adenopathy.          Assessment & Plan:  She is recovering from a CAP. The cough is still lingering but is slowly resolving. Recheck prn. Alysia Penna, MD l

## 2016-12-29 NOTE — Telephone Encounter (Signed)
Received PA request for Omeprazole capsules. PA denied. Waiting on paperwork.

## 2017-01-02 NOTE — Telephone Encounter (Signed)
It sounds like she will need to buy this out of pocket

## 2017-01-02 NOTE — Telephone Encounter (Signed)
I left a voice message with below information. 

## 2017-01-05 ENCOUNTER — Encounter: Payer: Self-pay | Admitting: Family Medicine

## 2017-01-05 ENCOUNTER — Ambulatory Visit (INDEPENDENT_AMBULATORY_CARE_PROVIDER_SITE_OTHER): Payer: Medicare Other | Admitting: Family Medicine

## 2017-01-05 ENCOUNTER — Ambulatory Visit: Payer: Medicare Other | Admitting: Family Medicine

## 2017-01-05 VITALS — BP 139/85 | HR 104 | Temp 98.2°F | Ht 63.0 in | Wt 167.0 lb

## 2017-01-05 DIAGNOSIS — J181 Lobar pneumonia, unspecified organism: Secondary | ICD-10-CM | POA: Diagnosis not present

## 2017-01-05 DIAGNOSIS — J189 Pneumonia, unspecified organism: Secondary | ICD-10-CM

## 2017-01-05 MED ORDER — LEVOFLOXACIN 500 MG PO TABS: 500.0000 mg | ORAL_TABLET | Freq: Every day | ORAL | 0 refills | Status: DC

## 2017-01-05 NOTE — Progress Notes (Signed)
Pre visit review using our clinic review tool, if applicable. No additional management support is needed unless otherwise documented below in the visit note. 

## 2017-01-05 NOTE — Progress Notes (Signed)
   Subjective:    Patient ID: April [name], female    DOB: 10-14-1929, 83 y.o.   MRN: [id number]  HPI Here for persistent coughing and wheezing. She was diagnosed with a LUL pneumonia by exam and by CXR on 12-13-16. She took a course of Biaxin. Now she feels somewhat better but she still has a hard dry cough and she also has wheezing for the first time. No fever.    Review of Systems  Constitutional: Negative.   HENT: Negative.   Eyes: Negative.   Respiratory: Positive for cough, chest tightness, shortness of breath and wheezing.   Cardiovascular: Negative.        Objective:   Physical Exam  Constitutional: She appears well-developed and well-nourished. No distress.  Neck: No thyromegaly present.  Cardiovascular: Normal rate, regular rhythm, normal heart sounds and intact distal pulses.   Pulmonary/Chest: Effort normal. She has no rales.  She has quiet sounds with consolidation in the left upper lung area as well as diffuse expiratory wheezes   Lymphadenopathy:    She has no cervical adenopathy.          Assessment & Plan:  Persistent LUL penumonia. Given 10 days of Levaquin. Get labs today and set up a chest CT soon.  Alysia Penna, MD

## 2017-01-11 ENCOUNTER — Encounter: Payer: Self-pay | Admitting: Family Medicine

## 2017-01-11 ENCOUNTER — Ambulatory Visit (INDEPENDENT_AMBULATORY_CARE_PROVIDER_SITE_OTHER): Payer: Medicare Other | Admitting: Family Medicine

## 2017-01-11 VITALS — BP 148/74 | HR 102 | Temp 98.7°F | Ht 63.0 in | Wt 167.0 lb

## 2017-01-11 DIAGNOSIS — J181 Lobar pneumonia, unspecified organism: Secondary | ICD-10-CM | POA: Diagnosis not present

## 2017-01-11 DIAGNOSIS — J189 Pneumonia, unspecified organism: Secondary | ICD-10-CM

## 2017-01-11 NOTE — Progress Notes (Signed)
Pre visit review using our clinic review tool, if applicable. No additional management support is needed unless otherwise documented below in the visit note. 

## 2017-01-11 NOTE — Progress Notes (Signed)
   Subjective:    Patient ID: April [name], female    DOB: 1929/02/10, 83 y.o.   MRN: [id number]  HPI Here for a persistent dry cough after taking 2 rounds of antibiotics for a LUL pneumonia. We first heard this on exam and this was confirmed on CXR on 12-13-16. She has taken Biaxin and then Levaquin with little improvement. No fever or chest pain or SOB.    Review of Systems  Constitutional: Negative.   HENT: Negative.   Eyes: Negative.   Respiratory: Positive for cough and wheezing. Negative for chest tightness and shortness of breath.   Cardiovascular: Negative.        Objective:   Physical Exam  Constitutional: She appears well-developed and well-nourished. No distress.  Neck: No thyromegaly present.  Cardiovascular: Normal rate, regular rhythm, normal heart sounds and intact distal pulses.   Pulmonary/Chest: Effort normal. No respiratory distress. She has no wheezes. She has no rales.  The left upper lobe is consolidated with decreased BS   Lymphadenopathy:    She has no cervical adenopathy.          Assessment & Plan:  Persistent cough with either a persistent LUL pneumonia or possibly a pleural effusion. We will refer her to Pulmonary to evaluate further.  Alysia Penna, MD

## 2017-01-17 ENCOUNTER — Ambulatory Visit (INDEPENDENT_AMBULATORY_CARE_PROVIDER_SITE_OTHER): Payer: Medicare Other | Admitting: Family Medicine

## 2017-01-17 ENCOUNTER — Encounter: Payer: Self-pay | Admitting: Family Medicine

## 2017-01-17 VITALS — BP 138/68 | HR 99 | Temp 98.2°F | Wt 169.7 lb

## 2017-01-17 DIAGNOSIS — J181 Lobar pneumonia, unspecified organism: Secondary | ICD-10-CM

## 2017-01-17 DIAGNOSIS — J189 Pneumonia, unspecified organism: Secondary | ICD-10-CM

## 2017-01-17 LAB — CBC WITH DIFFERENTIAL/PLATELET
BASOS ABS: 0.1 10*3/uL (ref 0.0–0.1)
Basophils Relative: 0.6 % (ref 0.0–3.0)
EOS PCT: 0.9 % (ref 0.0–5.0)
Eosinophils Absolute: 0.1 10*3/uL (ref 0.0–0.7)
HCT: 38.7 % (ref 36.0–46.0)
Hemoglobin: 12.7 g/dL (ref 12.0–15.0)
LYMPHS ABS: 2.6 10*3/uL (ref 0.7–4.0)
Lymphocytes Relative: 25.1 % (ref 12.0–46.0)
MCHC: 32.7 g/dL (ref 30.0–36.0)
MCV: 83.5 fl (ref 78.0–100.0)
MONOS PCT: 7.8 % (ref 3.0–12.0)
Monocytes Absolute: 0.8 10*3/uL (ref 0.1–1.0)
NEUTROS ABS: 6.8 10*3/uL (ref 1.4–7.7)
NEUTROS PCT: 65.6 % (ref 43.0–77.0)
Platelets: 255 10*3/uL (ref 150.0–400.0)
RBC: 4.63 Mil/uL (ref 3.87–5.11)
RDW: 16.9 % — ABNORMAL HIGH (ref 11.5–15.5)
WBC: 10.3 10*3/uL (ref 4.0–10.5)

## 2017-01-17 LAB — BASIC METABOLIC PANEL
BUN: 21 mg/dL (ref 6–23)
CALCIUM: 9.4 mg/dL (ref 8.4–10.5)
CO2: 28 meq/L (ref 19–32)
Chloride: 106 mEq/L (ref 96–112)
Creatinine, Ser: 0.97 mg/dL (ref 0.40–1.20)
GFR: 57.98 mL/min — ABNORMAL LOW (ref >60.00)
GLUCOSE: 105 mg/dL — AB (ref 70–99)
Potassium: 4.1 mEq/L (ref 3.5–5.1)
SODIUM: 140 meq/L (ref 135–145)

## 2017-01-17 MED ORDER — LOSARTAN POTASSIUM 50 MG PO TABS: 50.0000 mg | ORAL_TABLET | Freq: Every day | ORAL | 3 refills | Status: DC

## 2017-01-17 NOTE — Progress Notes (Signed)
   Subjective:    Patient ID: April [name], female    DOB: [birth year], 83 y.o.   MRN: [id number]  HPI Here for a chronic cough and to follow up a LUL pneumonia seen on CXR on 12-13-16. She has had several rounds of antibiotics but the cough persists. It is nonproductive. No chest pains or SOB. On her visit with [place] on 01-05-17 we ordered a chest CT scan but for some reason the facility never called her.    Review of Systems  Constitutional: Negative.   HENT: Negative.   Eyes: Negative.   Respiratory: Positive for cough. Negative for apnea, choking, chest tightness, shortness of breath, wheezing and stridor.   Cardiovascular: Negative.        Objective:   Physical Exam  Constitutional: She appears well-developed and well-nourished. No distress.  HENT:  Right Ear: External ear normal.  Left Ear: External ear normal.  Nose: Nose normal.  Mouth/Throat: Oropharynx is clear and moist.  Eyes: Conjunctivae are normal.  Neck: No thyromegaly present.  Cardiovascular: Normal rate, regular rhythm, normal heart sounds and intact distal pulses.   Pulmonary/Chest: Effort normal. No respiratory distress. She has no wheezes. She has no rales.  She has decreased BS in the left upper lobe, no rales   Musculoskeletal:  1+ ankle edema   Lymphadenopathy:    She has no cervical adenopathy.          Assessment & Plan:  Persistent cough after a LUL pneumonia. We have resent the order for a contrasted chest CT and this is scheduled for tomorrow afternoon. We will get a CBC and BMET here today. She is set to see Dr. Lamonte Sakai in the Pulmonary office on 01-27-17. In case her cough could be related to her Lisinopril, this was stopped and switched to Losartan 50 mg daily.  Alysia Penna, MD

## 2017-01-17 NOTE — Progress Notes (Signed)
Pre visit review using our clinic review tool, if applicable. No additional management support is needed unless otherwise documented below in the visit note. 

## 2017-01-18 ENCOUNTER — Ambulatory Visit (INDEPENDENT_AMBULATORY_CARE_PROVIDER_SITE_OTHER)
Admission: RE | Admit: 2017-01-18 | Discharge: 2017-01-18 | Disposition: A | Discharge status: Home / Self Care | Payer: Medicare Other | Source: Ambulatory Visit | Attending: Family Medicine | Admitting: Family Medicine

## 2017-01-18 DIAGNOSIS — J189 Pneumonia, unspecified organism: Secondary | ICD-10-CM

## 2017-01-18 DIAGNOSIS — J181 Lobar pneumonia, unspecified organism: Secondary | ICD-10-CM | POA: Diagnosis not present

## 2017-01-18 MED ORDER — IOPAMIDOL (ISOVUE-300) INJECTION 61%
80.0000 mL | Freq: Once | INTRAVENOUS | Status: AC | PRN
  Administered 2017-01-18: 80 mL via INTRAVENOUS

## 2017-01-24 ENCOUNTER — Telehealth: Payer: Self-pay | Admitting: Family Medicine

## 2017-01-24 NOTE — Telephone Encounter (Signed)
Dr. Fry is aware of this message.  

## 2017-01-24 NOTE — Telephone Encounter (Signed)
Pt has appointment on 02/06/2017 with Pulmonary. I spoke with pt and she will keep this appointment.

## 2017-01-24 NOTE — Telephone Encounter (Signed)
° ° ° °  Pt said Dr Elease Hashimoto contacted her and told her she had lung cancer. She would like a call back to found out what is her next step   [phone]

## 2017-01-25 ENCOUNTER — Other Ambulatory Visit: Payer: Self-pay | Admitting: Family Medicine

## 2017-01-27 ENCOUNTER — Other Ambulatory Visit: Payer: Self-pay | Admitting: Family Medicine

## 2017-02-01 ENCOUNTER — Telehealth: Payer: Self-pay | Admitting: Family Medicine

## 2017-02-01 NOTE — Telephone Encounter (Signed)
Pt would like to see if Dr. Sarajane Jews could assist her with getting an appointment with the [facility] since she has lung cancer she is unable to get in with them.

## 2017-02-01 NOTE — Telephone Encounter (Signed)
Before she sees a cancer specialist we need to have a clear diagnosis, and to do that she will need a biopsy. She sees Dr. Lamonte Sakai on 02-06-17 and he will arrange to get the biopsy, then they will go from there. u

## 2017-02-02 NOTE — Telephone Encounter (Signed)
Pt calling to check to see what the status was from the msg that was called in on yesterday.  Pt is aware that she need to see the pulmonologist and they will arrange and other visits needed.

## 2017-02-02 NOTE — Telephone Encounter (Signed)
I spoke with pt and went over below information. 

## 2017-02-06 ENCOUNTER — Institutional Professional Consult (permissible substitution): Payer: Medicare Other | Admitting: Emergency Medicine

## 2017-02-09 ENCOUNTER — Telehealth: Payer: Self-pay | Admitting: Family Medicine

## 2017-02-09 NOTE — Telephone Encounter (Signed)
° ° °  Pt was suppose to go on a trip next week and now that she is sick she said she can not go. She said her son could use the ticket but she need a note saying do to her health she can not travel. She would like to pick up this note tomorrow  02/10/17

## 2017-02-10 NOTE — Telephone Encounter (Signed)
I wrote such a note

## 2017-02-13 NOTE — Telephone Encounter (Signed)
I spoke with April [name], she requested to put in the mail to son Marcello Moores, which I did.

## 2017-02-20 ENCOUNTER — Telehealth: Payer: Self-pay | Admitting: Family Medicine

## 2017-02-20 NOTE — Telephone Encounter (Signed)
Pt states she needs a referral to the cancer center for the spot on her lung. Pt states a Norton Blizzard called her [phone] and advised pt she needed a referral.

## 2017-02-21 NOTE — Telephone Encounter (Signed)
She has an appt with Dr. Ashok Cordia (Pulmonary) on 03-10-17. He will do the biopsy and then we will refer to Oncology

## 2017-02-21 NOTE — Telephone Encounter (Signed)
I spoke with pt and gave information.

## 2017-02-27 ENCOUNTER — Telehealth: Payer: Self-pay

## 2017-02-27 NOTE — Telephone Encounter (Signed)
Received PA request from [facility] for Omeprazole 40 mg capsules. PA submitted & is pending. KeyLucita Ferrara

## 2017-02-27 NOTE — Telephone Encounter (Signed)
Pa approved, form faxed back to pharmacy. 

## 2017-03-10 ENCOUNTER — Ambulatory Visit (INDEPENDENT_AMBULATORY_CARE_PROVIDER_SITE_OTHER): Payer: Medicare Other | Admitting: Pulmonary Disease

## 2017-03-10 ENCOUNTER — Encounter: Payer: Self-pay | Admitting: Pulmonary Disease

## 2017-03-10 DIAGNOSIS — R918 Other nonspecific abnormal finding of lung field: Secondary | ICD-10-CM | POA: Diagnosis not present

## 2017-03-10 DIAGNOSIS — R590 Localized enlarged lymph nodes: Secondary | ICD-10-CM | POA: Insufficient documentation

## 2017-03-10 DIAGNOSIS — R0600 Dyspnea, unspecified: Secondary | ICD-10-CM | POA: Diagnosis not present

## 2017-03-10 NOTE — Patient Instructions (Signed)
   Please call or e-mail me if you have any questions or concerns.  I will see you back in a few weeks but we will be in touch before then.  TESTS ORDERED: 1. PET CT ASAP

## 2017-03-10 NOTE — Progress Notes (Signed)
Subjective:    Patient ID: April [name], female    DOB: 21-Feb-1929, 83 y.o.   MRN: [id number]  HPI The patient reports that she had "bronchitis" since Christmas. She reports she had a cough and sinus congestion. She reports her cough was minimally productive and had green phlegm. She denies any hemoptysis. Her cough previously resolved but then returned. She reports now she is coughing up nothing. She denies any nocturnal awakenings with coughing. She reports constant dyspnea that is worse with exertion. She does endorse audible wheezing. No chest pain, tightness, or pressure. No fever, chills or sweats. She has lost 15 pounds over 2 weeks due to lack of appetite. No dysphagia or odynophagia. No abdominal. She does have intermittent nausea but no emesis. No persistent headaches. No focal vision loss, weakness, numbness, or tingling. No adenopathy in her neck, groin, or axilla. She did undergo radiation therapy to her left side. She doesn't routinely use her Flovent or Albuterol inhalers.   Review of Systems No rashes or abnormal bruising. No dysuria or hematuria. A pertinent 14 point review of systems is negative except as per the history of presenting illness.  Allergies  Allergen Reactions  . Cefaclor Hives and Itching    Purple splotches  . Lansoprazole Hives and Itching    Purple splotches, hurting  . Penicillins Hives and Itching    Purple splotches  . Sulfa Drugs Cross Reactors Other (See Comments)    Purple whelps, itch, "just go crazy"    Current Outpatient Prescriptions on File Prior to Visit  Medication Sig Dispense Refill  . albuterol (PROVENTIL HFA;VENTOLIN HFA) 108 (90 BASE) MCG/ACT inhaler Inhale 2 puffs into the lungs every 4 (four) hours as needed for wheezing or shortness of breath. 1 Inhaler 2  . fluticasone (FLONASE) 50 MCG/ACT nasal spray Place 1 spray into both nostrils 2 (two) times daily. 16 g 6  . fluticasone (FLOVENT HFA) 220 MCG/ACT inhaler Inhale 2 puffs into the  lungs 2 (two) times daily. 1 Inhaler 2  . hydroxypropyl methylcellulose / hypromellose (ISOPTO TEARS / GONIOVISC) 2.5 % ophthalmic solution Place 1 drop into both eyes as needed for dry eyes.    Marland Kitchen ibuprofen (ADVIL,MOTRIN) 200 MG tablet Take 200 mg by mouth every 6 (six) hours as needed.    Marland Kitchen lisinopril (PRINIVIL,ZESTRIL) 20 MG tablet TAKE 1 TABLET ONCE DAILY. 90 tablet 1  . losartan (COZAAR) 50 MG tablet Take 1 tablet (50 mg total) by mouth daily. 90 tablet 3  . omeprazole (PRILOSEC) 40 MG capsule TAKE (1) CAPSULE TWICE DAILY. 60 capsule 4  . Polyethyl Glycol-Propyl Glycol (SYSTANE) 0.4-0.3 % SOLN Place 1 drop into both eyes 3 (three) times daily.     Marland Kitchen HYDROcodone-homatropine (HYDROMET) 5-1.5 MG/5ML syrup Take 5 mLs by mouth every 4 (four) hours as needed. (Patient not taking: Reported on 03/10/2017) 240 mL 0   No current facility-administered medications on file prior to visit.     Past Medical History:  Diagnosis Date  . Adenomatous colon polyp 11/2005  . ALLERGIC RHINITIS 04/18/2007  . Anemia   . Arthritis   . Breast cancer [facility]) 2012    Dr. [name], ER/PR positive L breast  . Bronchitis    taking levaquin now for it.  Still runs a low grade fever at times  . BURSITIS 01/04/2008  . COSTOCHONDRITIS 11/05/2007  . GERD 04/18/2007  . Hiatal hernia   . History of gallstones   . History of pneumonia   . Hx of  radiation therapy 10/26/11 to 12/16/11   L chest wall, L supraclavicular region  . HYPERTENSION 09/30/2009  . Hypothyroidism    YRS AGO.....THINGS ARE FINE NOW  . Internal hemorrhoids   . LOW BACK PAIN SYNDROME 07/13/2009  . Melanoma ([place])    removed from upper back  . Pneumonia   . ROTATOR CUFF SYNDROME 05/06/2010    Past Surgical History:  Procedure Laterality Date  . ANKLE SURGERY     right  . BREAST SURGERY  1968   left bx - noncancerous  . CATARACT EXTRACTION     bilateral  . CHOLECYSTECTOMY  1979  . excision of back melanoma    . JOINT REPLACEMENT     left knee  .  MASTECTOMY  08/25/11   bilateral , per Dr. Donne Hazel  . SENTINEL LYMPH NODE BIOPSY  08/25/11   bilateral  . TONSILLECTOMY AND ADENOIDECTOMY    . TOTAL KNEE ARTHROPLASTY Right 03/27/2015   Procedure: TOTAL KNEE ARTHROPLASTY;  Surgeon: [name], MD;  Location: [place];  Service: Orthopedics;  Laterality: Right;    Family History  Problem Relation Age of Onset  . Stroke Father   . Alcohol abuse Sister   . Cirrhosis Sister   . Thyroid cancer Mother   . Retinal detachment Mother   . Diabetes Paternal Uncle   . Asthma Maternal Grandmother     Social History   Social History  . Marital status: Married    Spouse name: N/A  . Number of children: 2  . Years of education: N/A   Occupational History  . retired    Social History Main Topics  . Smoking status: Passive Smoke Exposure - Never Smoker  . Smokeless tobacco: Never Used     Comment: as a child.   . Alcohol use 0.6 oz/week    1 Standard drinks or equivalent per week     Comment: socially  . Drug use: No  . Sexual activity: Not Asked   Other Topics Concern  . None   Social History Narrative   [facility] (03/10/17):   Originally from [facility]. Previously did live in [place] for 30 years. 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 No bird or mold exposure.       Objective:   Physical Exam BP 120/70 (BP Location: Right Arm, Patient Position: Sitting, Cuff Size: Normal)   Pulse 82   Ht 5\' 2"  (1.575 m)   Wt 159 lb 6.4 oz (72.3 kg)   SpO2 95%   BMI 29.15 kg/m  General:  Awake. Alert. No acute distress. Elderly female. Accompanied by son today. Integument:  Warm & dry. No rash on exposed skin. No bruising on exposed skin. Extremities:  No cyanosis or clubbing.  Lymphatics:  No appreciated cervical or supraclavicular lymphadenoapthy. HEENT:  Moist mucus membranes. No oral ulcers. No scleral injection or icterus. Minimal nasal turbinate swelling. Cardiovascular:  Regular rate. Right lower extremity/ankle edema. Regular rhythm.   Pulmonary:  Good aeration & clear to auscultation bilaterally. Symmetric chest wall expansion. No accessory muscle use on room air. Abdomen: Soft. Normal bowel sounds. Nondistended. Grossly nontender. Musculoskeletal:  Normal bulk and tone. Hand grip strength 5/5 bilaterally. No joint effusion appreciated. Neurological:  CN 2-12 grossly in tact. No meningismus. Moving all 4 extremities equally.  Psychiatric:  Mood and affect congruent. Speech normal rhythm, rate & tone.   IMAGING CT CHEST W/ CONTRAST 01/18/17 (personally reviewed by me): 2 cm portal caval node seen imaging by radiology. No pleural effusion or  thickening appreciated. No pericardial effusion. Right hilar lymph node measuring 1.4 cm. Pretracheal lymph node measuring 1.3 cm in short axis by my view. Medial consolidation left upper lobe with left hilar mass invading the left mainstem bronchus. Questionable pulmonary arterial invasion.    Assessment & Plan:  83 y.o. female with a history of breast cancer status post radiation treatment to her left chest with persistent cough, dyspnea, and a left upper lobe opacity with mediastinal and hilar adenopathy concerning for malignancy. I reviewed her chest CT imaging with her today and discussed the potential biopsy of her lung utilizing bronchoscopy with endobronchial ultrasound. We reviewed the risks of the procedure including bleeding, infection, pneumothorax, medication allergy, vocal cord injury, and potentially death. Given the timing of her previous CT scan we do need to perform appropriate staging to identify potential biopsy sites before proceeding to the more invasive procedure bronchoscopy. I did discuss the potential option of simply treating the patient symptomatically and avoiding invasive procedures as well as direct treatment of her cancer, but the patient wishes to know what her biopsy options are first before making further decisions. I instructed the patient by my office if she had  any further questions or concerns.  1. Left upper lobe opacity: Checking PET CT imaging to guide further biopsy. 2. Mediastinal adenopathy: Likely secondary to malignant spread. Checking PET CT imaging to guide further biopsy. 3. Dyspnea: Multifactorial. Clearly there is some element of endobronchial invasion within her left mainstem bronchus contributing to her dyspnea and cough. 4. Follow-up: Patient to return to clinic in 4 weeks or sooner if needed.  Sonia Baller Ashok Cordia, M.D. [facility] Pager:  [phone] After 3pm or if no response, call [phone] 3:40 PM 03/10/17

## 2017-03-20 ENCOUNTER — Ambulatory Visit (HOSPITAL_COMMUNITY)
Admission: RE | Admit: 2017-03-20 | Discharge: 2017-03-20 | Disposition: A | Discharge status: Home / Self Care | Payer: Medicare Other | Source: Ambulatory Visit | Attending: Pulmonary Disease | Admitting: Pulmonary Disease

## 2017-03-20 ENCOUNTER — Encounter (HOSPITAL_COMMUNITY): Payer: Self-pay

## 2017-03-20 ENCOUNTER — Telehealth: Payer: Self-pay | Admitting: Pulmonary Disease

## 2017-03-20 DIAGNOSIS — R918 Other nonspecific abnormal finding of lung field: Secondary | ICD-10-CM | POA: Diagnosis present

## 2017-03-20 LAB — GLUCOSE, CAPILLARY: GLUCOSE-CAPILLARY: 99 mg/dL (ref 65–99)

## 2017-03-20 MED ORDER — FLUDEOXYGLUCOSE F - 18 (FDG) INJECTION
8.1000 | Freq: Once | INTRAVENOUS | Status: AC
  Administered 2017-03-20: 8.1 via INTRAVENOUS

## 2017-03-20 NOTE — Telephone Encounter (Signed)
Spoke with pt, who states she is unsure if she has taken a medication previously to help with this. Pt states she must have or she wouldn't have able to perform scan, it has just been so long ago she can't recall.  JN please advise. Thanks.

## 2017-03-20 NOTE — Telephone Encounter (Signed)
Spoke with Bill at [facility] PET/CT, who states pt was scheduled for PET today. Pt states she was unable to do scan, due to claustrophobia. Bill asked if JN would like to reschedule PET and prescribe pt a Rx to help with the claustrophobia.   JN please advise. Thanks.

## 2017-03-20 NOTE — Telephone Encounter (Signed)
Please ask the patient if she has taken anything in the past to help with this? Thanks.

## 2017-03-22 MED ORDER — ALPRAZOLAM 0.25 MG PO TABS: 0.2500 mg | ORAL_TABLET | ORAL | 0 refills | Status: DC

## 2017-03-22 NOTE — Telephone Encounter (Signed)
Please put in a prescription for Xanax 0.25 mg - take 1 pill 30 minutes before CT scan. Can take 2nd pill upon arrival for CT if still anxious - #2 - no refills. Caution her against possible sedation & she should have someone drive her. Thanks.

## 2017-03-22 NOTE — Telephone Encounter (Signed)
Spoke with the pt and notified of recs per JN  She verbalized understanding  Rx was called to [facility]  She states that she is concerned that she has still not been scheduled for this  Will forward to PCC's to check status of the PET being scheduled, thanks

## 2017-03-23 NOTE — Telephone Encounter (Signed)
Called and scheduled PET scan for 03/31/17 arrive at 11:30am pt aware this is at [facility]

## 2017-03-31 ENCOUNTER — Ambulatory Visit (HOSPITAL_COMMUNITY)
Admission: RE | Admit: 2017-03-31 | Discharge: 2017-03-31 | Disposition: A | Discharge status: Home / Self Care | Payer: Medicare Other | Source: Ambulatory Visit | Attending: Pulmonary Disease | Admitting: Pulmonary Disease

## 2017-03-31 DIAGNOSIS — Z853 Personal history of malignant neoplasm of breast: Secondary | ICD-10-CM | POA: Diagnosis not present

## 2017-03-31 DIAGNOSIS — R918 Other nonspecific abnormal finding of lung field: Secondary | ICD-10-CM | POA: Diagnosis present

## 2017-03-31 DIAGNOSIS — C771 Secondary and unspecified malignant neoplasm of intrathoracic lymph nodes: Secondary | ICD-10-CM | POA: Insufficient documentation

## 2017-03-31 DIAGNOSIS — N2889 Other specified disorders of kidney and ureter: Secondary | ICD-10-CM | POA: Insufficient documentation

## 2017-03-31 LAB — GLUCOSE, CAPILLARY: GLUCOSE-CAPILLARY: 93 mg/dL (ref 65–99)

## 2017-03-31 MED ORDER — FLUDEOXYGLUCOSE F - 18 (FDG) INJECTION: 7.8000 | Freq: Once | INTRAVENOUS | Status: DC | PRN

## 2017-04-04 ENCOUNTER — Other Ambulatory Visit: Payer: Self-pay | Admitting: Pulmonary Disease

## 2017-04-04 DIAGNOSIS — Z853 Personal history of malignant neoplasm of breast: Secondary | ICD-10-CM

## 2017-04-04 NOTE — Progress Notes (Signed)
JN please advise which samples you like collected from the biospy.

## 2017-04-04 NOTE — Progress Notes (Signed)
Spoke with patient and informed her JN wanted biopsy scheduled. She was made aware someone would be contacting her to get that scheduled, but to contact the office if she has not heard anything by Friday afternoon. She verbalized understanding and did not have any questions. Order for biopsy was placed. Nothing further is needed.

## 2017-04-06 ENCOUNTER — Telehealth: Payer: Self-pay | Admitting: Pulmonary Disease

## 2017-04-06 DIAGNOSIS — R918 Other nonspecific abnormal finding of lung field: Secondary | ICD-10-CM

## 2017-04-06 DIAGNOSIS — Z853 Personal history of malignant neoplasm of breast: Secondary | ICD-10-CM

## 2017-04-06 DIAGNOSIS — R911 Solitary pulmonary nodule: Secondary | ICD-10-CM

## 2017-04-06 NOTE — Telephone Encounter (Signed)
Order was placed for a [place] Core Biopsy for a Lung Mass an h/o Breast Cancer -- Per [place] this needs to be changed to a US Biopsy for Left Chest wall lesion mass NOT breast. Order placed and set for STAT at [facility]. Nothing further needed.

## 2017-04-07 ENCOUNTER — Other Ambulatory Visit: Payer: Self-pay | Admitting: Radiology

## 2017-04-10 ENCOUNTER — Other Ambulatory Visit: Payer: Self-pay | Admitting: Student

## 2017-04-10 ENCOUNTER — Other Ambulatory Visit: Payer: Self-pay | Admitting: Radiology

## 2017-04-11 ENCOUNTER — Ambulatory Visit (HOSPITAL_COMMUNITY)
Admission: RE | Admit: 2017-04-11 | Discharge: 2017-04-11 | Disposition: A | Discharge status: Home / Self Care | Payer: Medicare Other | Source: Ambulatory Visit | Attending: Pulmonary Disease | Admitting: Pulmonary Disease

## 2017-04-11 ENCOUNTER — Ambulatory Visit: Payer: Medicare Other | Admitting: Pulmonary Disease

## 2017-04-11 DIAGNOSIS — Z823 Family history of stroke: Secondary | ICD-10-CM | POA: Insufficient documentation

## 2017-04-11 DIAGNOSIS — Z79899 Other long term (current) drug therapy: Secondary | ICD-10-CM | POA: Insufficient documentation

## 2017-04-11 DIAGNOSIS — Z85820 Personal history of malignant melanoma of skin: Secondary | ICD-10-CM | POA: Diagnosis not present

## 2017-04-11 DIAGNOSIS — Z853 Personal history of malignant neoplasm of breast: Secondary | ICD-10-CM

## 2017-04-11 DIAGNOSIS — Z808 Family history of malignant neoplasm of other organs or systems: Secondary | ICD-10-CM | POA: Insufficient documentation

## 2017-04-11 DIAGNOSIS — Z811 Family history of alcohol abuse and dependence: Secondary | ICD-10-CM | POA: Diagnosis not present

## 2017-04-11 DIAGNOSIS — Z880 Allergy status to penicillin: Secondary | ICD-10-CM | POA: Insufficient documentation

## 2017-04-11 DIAGNOSIS — E039 Hypothyroidism, unspecified: Secondary | ICD-10-CM | POA: Insufficient documentation

## 2017-04-11 DIAGNOSIS — Z9013 Acquired absence of bilateral breasts and nipples: Secondary | ICD-10-CM | POA: Diagnosis not present

## 2017-04-11 DIAGNOSIS — R911 Solitary pulmonary nodule: Secondary | ICD-10-CM

## 2017-04-11 DIAGNOSIS — Z8379 Family history of other diseases of the digestive system: Secondary | ICD-10-CM | POA: Insufficient documentation

## 2017-04-11 DIAGNOSIS — Z96653 Presence of artificial knee joint, bilateral: Secondary | ICD-10-CM | POA: Diagnosis not present

## 2017-04-11 DIAGNOSIS — Z882 Allergy status to sulfonamides status: Secondary | ICD-10-CM | POA: Diagnosis not present

## 2017-04-11 DIAGNOSIS — K219 Gastro-esophageal reflux disease without esophagitis: Secondary | ICD-10-CM | POA: Diagnosis not present

## 2017-04-11 DIAGNOSIS — Z923 Personal history of irradiation: Secondary | ICD-10-CM | POA: Insufficient documentation

## 2017-04-11 DIAGNOSIS — I10 Essential (primary) hypertension: Secondary | ICD-10-CM | POA: Diagnosis not present

## 2017-04-11 DIAGNOSIS — Z888 Allergy status to other drugs, medicaments and biological substances status: Secondary | ICD-10-CM | POA: Diagnosis not present

## 2017-04-11 DIAGNOSIS — C7989 Secondary malignant neoplasm of other specified sites: Secondary | ICD-10-CM | POA: Diagnosis not present

## 2017-04-11 DIAGNOSIS — R918 Other nonspecific abnormal finding of lung field: Secondary | ICD-10-CM

## 2017-04-11 DIAGNOSIS — R222 Localized swelling, mass and lump, trunk: Secondary | ICD-10-CM | POA: Diagnosis present

## 2017-04-11 LAB — CBC WITH DIFFERENTIAL/PLATELET
BASOS ABS: 0 10*3/uL (ref 0.0–0.1)
BASOS PCT: 0 %
EOS ABS: 0.1 10*3/uL (ref 0.0–0.7)
EOS PCT: 1 %
HCT: 39.4 % (ref 36.0–46.0)
Hemoglobin: 12.6 g/dL (ref 12.0–15.0)
Lymphocytes Relative: 27 %
Lymphs Abs: 2.3 10*3/uL (ref 0.7–4.0)
MCH: 27.3 pg (ref 26.0–34.0)
MCHC: 32 g/dL (ref 30.0–36.0)
MCV: 85.5 fL (ref 78.0–100.0)
Monocytes Absolute: 0.6 10*3/uL (ref 0.1–1.0)
Monocytes Relative: 7 %
Neutro Abs: 5.7 10*3/uL (ref 1.7–7.7)
Neutrophils Relative %: 65 %
PLATELETS: 250 10*3/uL (ref 150–400)
RBC: 4.61 MIL/uL (ref 3.87–5.11)
RDW: 14.7 % (ref 11.5–15.5)
WBC: 8.7 10*3/uL (ref 4.0–10.5)

## 2017-04-11 LAB — PROTIME-INR
INR: 1.04
PROTHROMBIN TIME: 13.7 s (ref 11.4–15.2)

## 2017-04-11 LAB — BASIC METABOLIC PANEL WITH GFR
Anion gap: 8 (ref 5–15)
BUN: 16 mg/dL (ref 6–20)
CO2: 26 mmol/L (ref 22–32)
Calcium: 9.7 mg/dL (ref 8.9–10.3)
Chloride: 104 mmol/L (ref 101–111)
Creatinine, Ser: 0.88 mg/dL (ref 0.44–1.00)
GFR calc Af Amer: >60 mL/min
GFR calc non Af Amer: 58 mL/min — ABNORMAL LOW
Glucose, Bld: 96 mg/dL (ref 65–99)
Potassium: 3.9 mmol/L (ref 3.5–5.1)
Sodium: 138 mmol/L (ref 135–145)

## 2017-04-11 MED ORDER — SODIUM CHLORIDE 0.9 % IV SOLN
INTRAVENOUS | Status: DC
  Administered 2017-04-11: 12:00:00 via INTRAVENOUS

## 2017-04-11 MED ORDER — LIDOCAINE HCL 1 % IJ SOLN
INTRAMUSCULAR | Status: AC
  Filled 2017-04-11: qty 20

## 2017-04-11 MED ORDER — MIDAZOLAM HCL 2 MG/2ML IJ SOLN
INTRAMUSCULAR | Status: AC
  Filled 2017-04-11: qty 2

## 2017-04-11 MED ORDER — MIDAZOLAM HCL 2 MG/2ML IJ SOLN
INTRAMUSCULAR | Status: AC | PRN
  Administered 2017-04-11: 1 mg via INTRAVENOUS

## 2017-04-11 MED ORDER — FENTANYL CITRATE (PF) 100 MCG/2ML IJ SOLN
INTRAMUSCULAR | Status: AC | PRN
  Administered 2017-04-11 (×2): 25 ug via INTRAVENOUS

## 2017-04-11 MED ORDER — FENTANYL CITRATE (PF) 100 MCG/2ML IJ SOLN
INTRAMUSCULAR | Status: AC
  Filled 2017-04-11: qty 2

## 2017-04-11 NOTE — H&P (Signed)
Chief Complaint: Patient was seen in consultation today for biopsy of left chest nodule at the request of Nestor,Jennings E  Referring Physician(s): Nestor,Jennings E  Supervising Physician: Aletta Edouard  Patient Status: [facility] - Out-pt  History of Present Illness: April [name] is a 83 y.o. female with hx of breast cancer. She is being worked up for new lung nodule. Her PET scan also shows multiple hypermetabolic lesions, including a left subpectoral nodule. She is referred for image guided biopsy. PMHx, meds, imaging, labs, allergies reviewed. She has been NPO this am. Family is at bedside.  Past Medical History:  Diagnosis Date  . Adenomatous colon polyp 11/2005  . ALLERGIC RHINITIS 04/18/2007  . Anemia   . Arthritis   . Breast cancer [facility]) 2012    Dr. [name], ER/PR positive L breast  . Bronchitis    taking levaquin now for it.  Still runs a low grade fever at times  . BURSITIS 01/04/2008  . COSTOCHONDRITIS 11/05/2007  . GERD 04/18/2007  . Hiatal hernia   . History of gallstones   . History of pneumonia   . Hx of radiation therapy 10/26/11 to 12/16/11   L chest wall, L supraclavicular region  . HYPERTENSION 09/30/2009  . Hypothyroidism    YRS AGO.....THINGS ARE FINE NOW  . Internal hemorrhoids   . LOW BACK PAIN SYNDROME 07/13/2009  . Melanoma ([place])    removed from upper back  . Pneumonia   . ROTATOR CUFF SYNDROME 05/06/2010    Past Surgical History:  Procedure Laterality Date  . ANKLE SURGERY     right  . BREAST SURGERY  1968   left bx - noncancerous  . CATARACT EXTRACTION     bilateral  . CHOLECYSTECTOMY  1979  . excision of back melanoma    . JOINT REPLACEMENT     left knee  . MASTECTOMY  08/25/11   bilateral , per Dr. Donne Hazel  . SENTINEL LYMPH NODE BIOPSY  08/25/11   bilateral  . TONSILLECTOMY AND ADENOIDECTOMY    . TOTAL KNEE ARTHROPLASTY Right 03/27/2015   Procedure: TOTAL KNEE ARTHROPLASTY;  Surgeon: [name], MD;  Location: [place];  Service:  Orthopedics;  Laterality: Right;    Allergies: Cefaclor; Lansoprazole; Penicillins; and Sulfa drugs cross reactors  Medications: Prior to Admission medications   Medication Sig Start Date End Date Taking? Authorizing Provider  ibuprofen (ADVIL,MOTRIN) 200 MG tablet Take 200 mg by mouth every 6 (six) hours as needed.   Yes [provider]  lisinopril (PRINIVIL,ZESTRIL) 20 MG tablet TAKE 1 TABLET ONCE DAILY. 01/25/17  Yes Laurey Morale, MD  omeprazole (PRILOSEC) 40 MG capsule TAKE (1) CAPSULE TWICE DAILY. 01/30/17  Yes Laurey Morale, MD  Polyethyl Glycol-Propyl Glycol (SYSTANE) 0.4-0.3 % SOLN Place 1 drop into both eyes 2 (two) times daily as needed (dry eyes).    Yes [provider]  albuterol (PROVENTIL HFA;VENTOLIN HFA) 108 (90 BASE) MCG/ACT inhaler Inhale 2 puffs into the lungs every 4 (four) hours as needed for wheezing or shortness of breath. 06/25/14   Laurey Morale, MD  ALPRAZolam Duanne Moron) 0.25 MG tablet Take 1 tablet (0.25 mg total) by mouth as directed. Patient taking differently: Take 0.25 mg by mouth daily as needed for anxiety.  03/22/17   Javier Glazier, MD  fluticasone (FLONASE) 50 MCG/ACT nasal spray Place 1 spray into both nostrils 2 (two) times daily. Patient taking differently: Place 1 spray into both nostrils daily as needed for allergies.  03/09/16  Martinique, Betty G, MD     Family History  Problem Relation Age of Onset  . Stroke Father   . Alcohol abuse Sister   . Cirrhosis Sister   . Thyroid cancer Mother   . Retinal detachment Mother   . Diabetes Paternal Uncle   . Asthma Maternal Grandmother     Social History   Social History  . Marital status: Widowed    Spouse name: N/A  . Number of children: 2  . Years of education: N/A   Occupational History  . retired    Social History Main Topics  . Smoking status: Passive Smoke Exposure - Never Smoker  . Smokeless tobacco: Never Used     Comment: as a child.   . Alcohol use 0.6 oz/week     1 Standard drinks or equivalent per week     Comment: socially  . Drug use: No  . Sexual activity: Not on file   Other Topics Concern  . Not on file   Social History Narrative   [facility] (03/10/17):   Originally from [facility]. Previously did live in [place] for 30 years. 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 No bird or mold exposure.      Review of Systems: A 12 point ROS discussed and pertinent positives are indicated in the HPI above.  All other systems are negative.  Review of Systems  Vital Signs: Pulse 87   Temp 98.9 F (37.2 C)   Resp 18   Ht 5\' 2"  (1.575 m)   Wt 159 lb (72.1 kg)   BMI 29.08 kg/m   Physical Exam  Constitutional: She is oriented to person, place, and time. She appears well-developed and well-nourished. No distress.  HENT:  Head: Normocephalic.  Mouth/Throat: Oropharynx is clear and moist.  Neck: Normal range of motion. No JVD present. No tracheal deviation present.  Cardiovascular: Normal rate, regular rhythm and normal heart sounds.   Pulmonary/Chest: Effort normal and breath sounds normal. No respiratory distress.  Neurological: She is alert and oriented to person, place, and time.  Skin: Skin is warm and dry.  Psychiatric: She has a normal mood and affect. Judgment normal.    Mallampati Score:  MD Evaluation Airway: WNL Heart: WNL Abdomen: WNL Chest/ Lungs: WNL ASA  Classification: 3 Mallampati/Airway Score: Two  Imaging: Nm Pet Image Restag (ps) Skull Base To Thigh  Result Date: 03/31/2017 CLINICAL DATA:  Initial Treatment strategy for lung mass. History of breast cancer. EXAM: NUCLEAR MEDICINE PET SKULL BASE TO THIGH TECHNIQUE: 7.8 mCi F-18 FDG was injected intravenously. Full-ring PET imaging was performed from the skull base to thigh after the radiotracer. CT data was obtained and used for attenuation correction and anatomic localization. FASTING BLOOD GLUCOSE:  Value: 93 mg/dl COMPARISON:  PET-CT 03/01/2011, chest CT 01/18/2007 mean  FINDINGS: NECK No hypermetabolic lymph nodes in the neck. CHEST Hypermetabolic mass in the LEFT suprahilar medial upper lobe with SUV max equal 18.5. Mass lesion difficult to measure at approximately 3 cm on image 16, series 4. Correlate with recent diagnostic CT. There is postobstructive collapse of the LEFT upper lobe medially. There is hypermetabolic subcarinal and contralateral RIGHT lower paratracheal metabolic adenopathy. For example RIGHT lower paratracheal lymph node with SUV max equal 8.4. This a small node measuring approximately 9 mm. Hypermetabolic RIGHT hilar lymph nodes with SUV max equal 5.4. Small hypermetabolic RIGHT supraclavicular lymph nodes are very small but intense metabolic activity for size. For example 7 mm node on image 37, series 4  with SUV max equal 4.0. Focused intense metabolic activity at the level just anterior to the LEFT humeral head without clear lesion on CT favors small lymph node Hypermetabolic activity along the medial border of the LEFT pectoralis muscle with SUV max equal 5.8. 7 mm pulmonary nodule in the RIGHT middle lobe (image 66, series 4) is stable. ABDOMEN/PELVIS No abnormal hypermetabolic activity within the liver, pancreas, adrenal glands, or spleen. No hypermetabolic lymph nodes in the abdomen or pelvis. Mildly anterior margin of the RIGHT kidney 3.6 cm mass has intermediate density similar to renal parenchyma and with associated metabolic activity. Planning is most consistent renal cell carcinoma. SKELETON No focal hypermetabolic activity to suggest skeletal metastasis. IMPRESSION: 1. Hypermetabolic LEFT suprahilar mass obstructing the LEFT upper lobe bronchus consistent with bronchogenic carcinoma versus metastatic breast cancer. 2. Subcarinal, RIGHT paratracheal and RIGHT hilar metastatic adenopathy. 3. Hypermetabolic RIGHT supraclavicular nodal metastasis. 4. Hypermetabolic activity along the margin of the LEFT pectoralis muscle is concerning for breast cancer  recurrence. 5. Hypermetabolic focus just anterior to the LEFT humeral head is also favored breast cancer recurrence . 6. Soft tissue mass extending from the RIGHT kidney measuring 3.5 cm and with mild metabolic activity is most consistent RENAL CELL CARCINOMA. These results will be called to the ordering clinician or representative by the Radiologist Assistant, and communication documented in the PACS or zVision Dashboard. Electronically Signed   By: Suzy Bouchard M.D.   On: 03/31/2017 16:21    Labs:  CBC:  Recent Labs  01/17/17 1348 04/11/17 1152  WBC 10.3 8.7  HGB 12.7 12.6  HCT 38.7 39.4  PLT 255.0 250    COAGS:  Recent Labs  04/11/17 1152  INR 1.04    BMP:  Recent Labs  01/17/17 1348  NA 140  K 4.1  CL 106  CO2 28  GLUCOSE 105*  BUN 21  CALCIUM 9.4  CREATININE 0.97     Assessment and Plan: Hypermetabolic left subpectoral nodule Hx breast cancer For US guided biopsy Labs ok Risks and Benefits discussed with the patient including, but not limited to bleeding, infection, damage to adjacent structures or low yield requiring additional tests. All of the patient's questions were answered, patient is agreeable to proceed. Consent signed and in chart.    Thank you for this interesting consult.  I greatly enjoyed meeting Butler and look forward to participating in their care.  A copy of this report was sent to the requesting provider on this date.  Electronically Signed: Ascencion Dike 04/11/2017, 12:37 PM   I spent a total of 20 minutes in face to face in clinical consultation, greater than 50% of which was counseling/coordinating care for left chest wall nodule biopsy

## 2017-04-11 NOTE — Procedures (Signed)
Interventional Radiology Procedure Note  Procedure:  US guided biopsy of left subpectoral mass  Complications: None  Estimated Blood Loss: < 10 mL  2 cm left lateral subpectoral mass visualized by [place].  16 G core bx x 4.  Venetia Night. Kathlene Cote, M.D Pager:  [phone]

## 2017-04-11 NOTE — Sedation Documentation (Signed)
Patient is resting comfortably. 

## 2017-04-11 NOTE — Sedation Documentation (Signed)
Bandaid L axillary area intact

## 2017-04-11 NOTE — Discharge Instructions (Signed)
Needle Biopsy, Care After °Refer to this sheet in the next few weeks. These instructions provide you with information about caring for yourself after your procedure. Your health care provider may also give you more specific instructions. Your treatment has been planned according to current medical practices, but problems sometimes occur. Call your health care provider if you have any problems or questions after your procedure. °What can I expect after the procedure? °After your procedure, it is common to have soreness, bruising, or mild pain at the biopsy site. This should go away in a few days. °Follow these instructions at home: °· Rest as directed by your health care provider. °· Take medicines only as directed by your health care provider. °· There are many different ways to close and cover the biopsy site, including stitches (sutures), skin glue, and adhesive strips. Follow your health care provider's instructions about: °¨ Biopsy site care. °¨ Bandage (dressing) changes and removal. °¨ Biopsy site closure removal. °· Check your biopsy site every day for signs of infection. Watch for: °¨ Redness, swelling, or pain. °¨ Fluid, blood, or pus. °Contact a health care provider if: °· You have a fever. °· You have redness, swelling, or pain at the biopsy site that lasts longer than a few days. °· You have fluid, blood, or pus coming from the biopsy site. °· You feel nauseous. °· You vomit. °Get help right away if: °· You have shortness of breath. °· You have trouble breathing. °· You have chest pain. °· You feel dizzy or you faint. °· You have bleeding that does not stop with pressure or a bandage. °· You cough up blood. °· You have pain in your abdomen. °This information is not intended to replace advice given to you by your health care provider. Make sure you discuss any questions you have with your health care provider. °Document Released: 03/31/2015 Document Revised: 04/21/2016 Document Reviewed:  11/10/2014 °Elsevier Interactive Patient Education © 2017 [employer] ° °

## 2017-04-15 ENCOUNTER — Other Ambulatory Visit: Payer: Self-pay | Admitting: Oncology

## 2017-04-17 NOTE — Progress Notes (Signed)
Per Dr. Jana Hakim, patient has an appointment on 5/30. Appointment was not listed on patient appointment list. Dr. Jana Hakim office was contacted and Caryl Pina confirmed patient had an appointment on 5/30 at 4pm. Nothing further is needed.

## 2017-04-19 ENCOUNTER — Telehealth: Payer: Self-pay | Admitting: Pulmonary Disease

## 2017-04-19 NOTE — Telephone Encounter (Signed)
Spoke with pt, states that she was recently dx'ed with breast ca, wants to proceed with treatment as her symptoms are worsening.  Per pt's chart, she was referred to Dr. Jana Hakim to follow up on cancer care- per chart pt has an appt on 04/26/17 with Dr. Jana Hakim.  Pt states she will keep this appt, and call back if she has any further questions/concerns.  Nothing further needed.

## 2017-04-25 ENCOUNTER — Other Ambulatory Visit: Payer: Self-pay | Admitting: *Deleted

## 2017-04-25 DIAGNOSIS — C50912 Malignant neoplasm of unspecified site of left female breast: Secondary | ICD-10-CM

## 2017-04-26 ENCOUNTER — Other Ambulatory Visit: Payer: Medicare Other

## 2017-04-26 ENCOUNTER — Telehealth: Payer: Self-pay | Admitting: *Deleted

## 2017-04-26 ENCOUNTER — Ambulatory Visit: Payer: Medicare Other | Admitting: Oncology

## 2017-04-26 MED ORDER — PROCHLORPERAZINE MALEATE 5 MG PO TABS: 5.0000 mg | ORAL_TABLET | Freq: Four times a day (QID) | ORAL | 0 refills | Status: DC | PRN

## 2017-04-26 NOTE — Telephone Encounter (Signed)
This RN contacted pt per her call stating she had to cancel appointment today due to episode of vomiting.  This RN stated concern that above symptoms may be related to current diagnosis and appointment would allow for further management.  [place] states she has already cancelled her transportation.  Per review with MD new date and time obtained as well as prescription for anti nausea medication given.  Informed patient who verbalized understanding.

## 2017-04-29 ENCOUNTER — Other Ambulatory Visit: Payer: Self-pay | Admitting: Oncology

## 2017-04-29 NOTE — [place] Notes (Unsigned)
ID: [name]   DOB: 09-06-1929  MR#: [id number]  VQM#:[id number]  PCP: April Morale, MD GYN:  SU: April [name] OTHER MD: April [name], April [name], April [name], April [name]  CHIEF COMPLAINT: Estrogen receptor positive recurrent breast cancer  CURRENT THERAPY: observation  BREAST CANCER HISTORY: According to Dr. Julien [name] 02/23/2011 note:  She presented with a palpable mass in her left breast.  She was referred for a mammogram on 02/14/2011.  At that time diagnostic mammogram and ultrasound showed scattered fibroglandular densities in the lateral aspect of the left breast there was an irregular spiculated mass measuring 3.5 x 4.1 x 3.8 cm.  The physical exam at that time showed a hard palpable mass at 3 o'clock position in the left breast with some skin retraction.  Ultrasound was performed, which showed a hypoechoic mass with shadowing 10 cm from the nipple measuring 2.2 x 2.0 mm.  In the right breast there was two hypoechoic masses at 12 o'clock position 3 cm from the nipple measuring 5 x 5 mm each.  Biopsies recommended.  Biopsy on the suspicious lymph node was done at the same time is the primary mass on 02/14/2011.  Pathology showed this to be invasive mammary carcinoma.  The left axillary mass has insufficient tissue for diagnosis, the tumor was ER and PR positive, 100% strong staining intensity, proliferative index low at 36%.  The HER-2 ratio was 1.19.  The patient underwent a MRI scan of both breasts on 02/21/2011.  In the tail of the left breast there was a mass measuring 2.7 x 2.6 x 2.9 cm medial margin of both pectoralis muscle.  In the right breast there was clumped nodular enhancement at 12 o'clock position.  This area measured 1.8 cm.  Sentinel left axillary lymph node was also noted, which appeared to be suspicious. Her subsequent history is as detailed below.  INTERVAL HISTORY: April [name] returns today for follow up of her bilateral breast cancer and oncocytoma. The interval  history is chiefly significant for her husband have been gone to assisted living, because of his severe Alzheimer's disease. She is now living by herself, which she does not like, but on the other hand she has a lot more time to herself.  REVIEW OF SYSTEMS: April [name] had right knee surgery early April of this year. She is getting over that. She is not exercising regularly but she is planning [facility] Of course she has her orthopedic problems which limit her. A detailed review of systems today was otherwise stable   PAST MEDICAL HISTORY: Past Medical History:  Diagnosis Date  . Adenomatous colon polyp 11/2005  . ALLERGIC RHINITIS 04/18/2007  . Anemia   . Arthritis   . Breast cancer [facility]) 2012    Dr. Truddie [name], ER/PR positive L breast  . Bronchitis    taking levaquin now for it.  Still runs a low grade fever at times  . BURSITIS 01/04/2008  . COSTOCHONDRITIS 11/05/2007  . GERD 04/18/2007  . Hiatal hernia   . History of gallstones   . History of pneumonia   . Hx of radiation therapy 10/26/11 to 12/16/11   L chest wall, L supraclavicular region  . HYPERTENSION 09/30/2009  . Hypothyroidism    YRS AGO.....THINGS ARE FINE NOW  . Internal hemorrhoids   . LOW BACK PAIN SYNDROME 07/13/2009  . Melanoma ([place])    removed from upper back  . Pneumonia   . ROTATOR CUFF SYNDROME 05/06/2010    PAST  SURGICAL HISTORY: Past Surgical History:  Procedure Laterality Date  . ANKLE SURGERY     right  . BREAST SURGERY  1968   left bx - noncancerous  . CATARACT EXTRACTION     bilateral  . CHOLECYSTECTOMY  1979  . excision of back melanoma    . JOINT REPLACEMENT     left knee  . MASTECTOMY  08/25/11   bilateral , per Dr. Donne [name]  . SENTINEL LYMPH NODE BIOPSY  08/25/11   bilateral  . TONSILLECTOMY AND ADENOIDECTOMY    . TOTAL KNEE ARTHROPLASTY Right 03/27/2015   Procedure: TOTAL KNEE ARTHROPLASTY;  Surgeon: April Server, MD;  Location: [place];  Service: Orthopedics;  Laterality: Right;     FAMILY HISTORY Family History  Problem Relation Age of Onset  . Stroke Father   . Alcohol abuse Sister   . Cirrhosis Sister   . Thyroid cancer Mother   . Retinal detachment Mother   . Diabetes Paternal Uncle   . Asthma Maternal Grandmother    the patient's father died at the age of 12 following a stroke. He had an independent in driving up to one month prior to dying. [name] died from thyroid cancer at the age of 34. This had been diagnosed less than a year before her death. The patient had one brother and one sister. There is no history of breast or ovarian cancer in the family to her knowledge.  GYNECOLOGIC HISTORY: Menarche age 89, first live birth age 40, she is [name]. She underwent menopause around 30. She did not take hormone replacement.  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 Her husband April [name] is a retired Art gallery manager. Unfortunately she tells [name] he is severely demented at present. She is the primary caregiver. Son April [name] the third lives in [place] and works for the [employer]. Daughter April [name] lives in [place] and works with a [employer]. The patient has 3 grandchildren. She attends a [employer].   ADVANCED DIRECTIVES: In place  HEALTH MAINTENANCE: Social History  Substance Use Topics  . Smoking status: Passive Smoke Exposure - Never Smoker  . Smokeless tobacco: Never Used     Comment: as a child.   . Alcohol use 0.6 oz/week    1 Standard drinks or equivalent per week     Comment: socially     Colonoscopy:  PAP:  Bone density:  Lipid panel:  Allergies  Allergen Reactions  . Cefaclor Hives and Itching    Purple splotches  . Lansoprazole Hives and Itching    Purple splotches, hurting  . Penicillins Hives and Itching    Purple splotches  . Sulfa Drugs Cross Reactors Other (See Comments)    Purple whelps, itch, "just go crazy"     Current Outpatient Prescriptions  Medication Sig Dispense Refill  . albuterol (PROVENTIL HFA;VENTOLIN HFA) 108 (90 BASE) MCG/ACT inhaler Inhale 2 puffs into the lungs every 4 (four) hours as needed for wheezing or shortness of breath. 1 Inhaler 2  . ALPRAZolam (XANAX) 0.25 MG tablet Take 1 tablet (0.25 mg total) by mouth as directed. (Patient taking differently: Take 0.25 mg by mouth daily as needed for anxiety. ) 2 tablet 0  . fluticasone (FLONASE) 50 MCG/ACT nasal spray Place 1 spray into both nostrils 2 (two) times daily. (Patient taking differently: Place 1 spray into both nostrils daily as needed for allergies. ) 16 g 6  . ibuprofen (ADVIL,MOTRIN)  200 MG tablet Take 200 mg by mouth every 6 (six) hours as needed.    Marland Kitchen lisinopril (PRINIVIL,ZESTRIL) 20 MG tablet TAKE 1 TABLET ONCE DAILY. 90 tablet 1  . omeprazole (PRILOSEC) 40 MG capsule TAKE (1) CAPSULE TWICE DAILY. 60 capsule 4  . Polyethyl Glycol-Propyl Glycol (SYSTANE) 0.4-0.3 % SOLN Place 1 drop into both eyes 2 (two) times daily as needed (dry eyes).     . prochlorperazine (COMPAZINE) 5 MG tablet Take 1 tablet (5 mg total) by mouth every 6 (six) hours as needed for nausea or vomiting. 30 tablet 0   No current facility-administered medications for this visit.     OBJECTIVE: Elderly white woman walking with a cane There were no vitals filed for this visit.   There is no height or weight on file to calculate BMI.    ECOG FS: 1  Sclerae unicteric, EOMs intact Oropharynx clear, dentition in fair repair No cervical or supraclavicular adenopathy Lungs no rales or rhonchi Heart regular rate and rhythm Abd soft, nontender, positive bowel sounds MSK no focal spinal tenderness, obvious osteoarthritic deformities involving multiple finger joints Neuro: nonfocal, well oriented, positive affect Breasts: Status post bilateral mastectomies. The left sided scar is somewhat irregular, but soft, with no erythema or hard foci to suggest  recurrence.  Both axillae are benign.   LAB RESULTS: Lab Results  Component Value Date   WBC 8.7 04/11/2017   NEUTROABS 5.7 04/11/2017   HGB 12.6 04/11/2017   HCT 39.4 04/11/2017   MCV 85.5 04/11/2017   PLT 250 04/11/2017      Chemistry      Component Value Date/Time   NA 138 04/11/2017 1152   NA 141 07/28/2015 1332   K 3.9 04/11/2017 1152   K 4.2 07/28/2015 1332   CL 104 04/11/2017 1152   CL 108 (H) 12/25/2012 1235   CO2 26 04/11/2017 1152   CO2 24 07/28/2015 1332   BUN 16 04/11/2017 1152   BUN 22.8 07/28/2015 1332   CREATININE 0.88 04/11/2017 1152   CREATININE 0.9 07/28/2015 1332      Component Value Date/Time   CALCIUM 9.7 04/11/2017 1152   CALCIUM 9.4 07/28/2015 1332   ALKPHOS 70 07/28/2015 1332   AST 11 07/28/2015 1332   ALT 10 07/28/2015 1332   BILITOT <0.20 07/28/2015 1332       Lab Results  Component Value Date   LABCA2 12 12/25/2012    No components found for: [id number]  No results for input(s): INR in the last 168 hours.  Urinalysis    Component Value Date/Time   COLORURINE YELLOW 03/13/2015 1042   APPEARANCEUR CLEAR 03/13/2015 1042   LABSPEC 1.007 03/13/2015 1042   PHURINE 6.5 03/13/2015 1042   GLUCOSEU NEGATIVE 03/13/2015 1042   HGBUR NEGATIVE 03/13/2015 1042   HGBUR negative 05/24/2010 1555   BILIRUBINUR neg 09/08/2015 1557   KETONESUR NEGATIVE 03/13/2015 1042   PROTEINUR neg 09/08/2015 1557   PROTEINUR NEGATIVE 03/13/2015 1042   UROBILINOGEN 0.2 09/08/2015 1557   UROBILINOGEN 0.2 03/13/2015 1042   NITRITE neg 09/08/2015 1557   NITRITE NEGATIVE 03/13/2015 1042   LEUKOCYTESUR Negative 09/08/2015 1557    STUDIES: Nm Pet Image Restag (ps) Skull Base To Thigh  Result Date: 03/31/2017 CLINICAL DATA:  Initial Treatment strategy for lung mass. History of breast cancer. EXAM: NUCLEAR MEDICINE PET SKULL BASE TO THIGH TECHNIQUE: 7.8 mCi F-18 FDG was injected intravenously. Full-ring PET imaging was performed from the skull base to thigh after  the radiotracer. CT  data was obtained and used for attenuation correction and anatomic localization. FASTING BLOOD GLUCOSE:  Value: 93 mg/dl COMPARISON:  PET-CT 03/01/2011, chest CT 01/18/2007 mean FINDINGS: NECK No hypermetabolic lymph nodes in the neck. CHEST Hypermetabolic mass in the LEFT suprahilar medial upper lobe with SUV max equal 18.5. Mass lesion difficult to measure at approximately 3 cm on image 16, series 4. Correlate with recent diagnostic CT. There is postobstructive collapse of the LEFT upper lobe medially. There is hypermetabolic subcarinal and contralateral RIGHT lower paratracheal metabolic adenopathy. For example RIGHT lower paratracheal lymph node with SUV max equal 8.4. This a small node measuring approximately 9 mm. Hypermetabolic RIGHT hilar lymph nodes with SUV max equal 5.4. Small hypermetabolic RIGHT supraclavicular lymph nodes are very small but intense metabolic activity for size. For example 7 mm node on image 37, series 4 with SUV max equal 4.0. Focused intense metabolic activity at the level just anterior to the LEFT humeral head without clear lesion on CT favors small lymph node Hypermetabolic activity along the medial border of the LEFT pectoralis muscle with SUV max equal 5.8. 7 mm pulmonary nodule in the RIGHT middle lobe (image 66, series 4) is stable. ABDOMEN/PELVIS No abnormal hypermetabolic activity within the liver, pancreas, adrenal glands, or spleen. No hypermetabolic lymph nodes in the abdomen or pelvis. Mildly anterior margin of the RIGHT kidney 3.6 cm mass has intermediate density similar to renal parenchyma and with associated metabolic activity. Planning is most consistent renal cell carcinoma. SKELETON No focal hypermetabolic activity to suggest skeletal metastasis. IMPRESSION: 1. Hypermetabolic LEFT suprahilar mass obstructing the LEFT upper lobe bronchus consistent with bronchogenic carcinoma versus metastatic breast cancer. 2. Subcarinal, RIGHT paratracheal and  RIGHT hilar metastatic adenopathy. 3. Hypermetabolic RIGHT supraclavicular nodal metastasis. 4. Hypermetabolic activity along the margin of the LEFT pectoralis muscle is concerning for breast cancer recurrence. 5. Hypermetabolic focus just anterior to the LEFT humeral head is also favored breast cancer recurrence . 6. Soft tissue mass extending from the RIGHT kidney measuring 3.5 cm and with mild metabolic activity is most consistent RENAL CELL CARCINOMA. These results will be called to the ordering clinician or representative by the Radiologist Assistant, and communication documented in the PACS or zVision Dashboard. Electronically Signed   By: Suzy Bouchard M.D.   On: 03/31/2017 16:21   US Biopsy  Result Date: 04/11/2017 CLINICAL DATA:  History of breast carcinoma. Widespread malignancy in chest including a chest wall mass in the lateral left subpectoral region which is hypermetabolic by PET scan. The patient presents for ultrasound-guided biopsy of this mass. EXAM: ULTRASOUND GUIDED CORE BIOPSY OF LEFT CHEST WALL MASS COMPARISON:  PET scan on 03/20/2017 MEDICATIONS: 1.0 mg IV Versed; 50 mcg IV Fentanyl Total Moderate Sedation Time: 10 minutes. The patient's level of consciousness and physiologic status were continuously monitored during the procedure by Radiology nursing. PROCEDURE: The procedure, risks, benefits, and alternatives were explained to the patient. Questions regarding the procedure were encouraged and answered. The patient understands and consents to the procedure. A time out was performed prior to initiating the procedure. The left chest wall was prepped with chlorhexidine in a sterile fashion, and a sterile drape was applied covering the operative field. A sterile gown and sterile gloves were used for the procedure. Local anesthesia was provided with 1% Lidocaine. Under ultrasound guidance, 4 separate 16 gauge core biopsy samples were obtained through a left subpectoral chest wall mass. Core  biopsy samples were submitted in formalin. COMPLICATIONS: None. FINDINGS: Ultrasound demonstrates a spiculated,  hypoechoic solid mass measuring approximately 2.1 x 1.3 x 1.4 cm in the left lateral subpectoral region between the pectoralis major and minor muscles in corresponding exactly in location to the abnormality seen by recent PET scan. Solid tissue was obtained. IMPRESSION: Ultrasound-guided core biopsy performed of a 2.1 cm spiculated left lateral subpectoral chest wall mass. Electronically Signed   By: Aletta Edouard M.D.   On: 04/11/2017 16:46     ASSESSMENT: 83 y.o. 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  (2) status post bilateral mastectomies 08/25/2011, showing  (a) on the right, ductal carcinoma in situ, low-grade, estrogen receptor 100% and progesterone receptor 80% positive, with ample margins  (b) on the left, a pT2 pN1, stage IIB invasive ductal carcinoma, grade 1, 100% estrogen and 100% progesterone receptor positive, with an MIB-1 of 36%, and no HER-2 amplification.  (3) left postmastectomy radiation completed 12/27/2011  (4) took aromatase inhibitors (letrozole and anastrozole) prior to her bilateral mastectomies, with poor tolerance. Took tamoxifen briefly after completing radiation, and again with poor tolerance. 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 I am comfortable releasing her to her primary care physician at this point and she very much likes that idea. She was not interested in our "survivorship" program.  The cystic pancreatic lesion requires no further follow-up. The kidney oncocytoma has not changed in 2 years area I  think he would be useful if she met with Dr. doll stead perhaps one more time about a year from now regarding that and I am setting that appointment up  Otherwise as far as breast cancer is concerned all Ikhlas will need is a yearly physician breast exam.  I will be glad to see her at any point in the future if and when the need arises, but as of now we're making no further routine appointments for her here. lMAGRINAT,Lakaya Tolen C    04/29/2017

## 2017-05-02 ENCOUNTER — Other Ambulatory Visit: Payer: Medicare Other

## 2017-05-02 ENCOUNTER — Ambulatory Visit (HOSPITAL_BASED_OUTPATIENT_CLINIC_OR_DEPARTMENT_OTHER): Payer: Medicare Other | Admitting: Adult Health

## 2017-05-02 ENCOUNTER — Other Ambulatory Visit (HOSPITAL_BASED_OUTPATIENT_CLINIC_OR_DEPARTMENT_OTHER): Payer: Medicare Other

## 2017-05-02 ENCOUNTER — Ambulatory Visit: Payer: Medicare Other

## 2017-05-02 VITALS — BP 143/65 | HR 86 | Temp 97.9°F | Resp 18 | Ht 62.0 in | Wt 154.3 lb

## 2017-05-02 DIAGNOSIS — N289 Disorder of kidney and ureter, unspecified: Secondary | ICD-10-CM

## 2017-05-02 DIAGNOSIS — C7802 Secondary malignant neoplasm of left lung: Secondary | ICD-10-CM

## 2017-05-02 DIAGNOSIS — C50912 Malignant neoplasm of unspecified site of left female breast: Secondary | ICD-10-CM

## 2017-05-02 DIAGNOSIS — R599 Enlarged lymph nodes, unspecified: Secondary | ICD-10-CM

## 2017-05-02 DIAGNOSIS — Z170 Estrogen receptor positive status [ER+]: Secondary | ICD-10-CM | POA: Diagnosis not present

## 2017-05-02 DIAGNOSIS — R5383 Other fatigue: Secondary | ICD-10-CM

## 2017-05-02 LAB — CBC WITH DIFFERENTIAL/PLATELET
BASO%: 0.8 % (ref 0.0–2.0)
BASOS ABS: 0.1 10*3/uL (ref 0.0–0.1)
EOS ABS: 0.2 10*3/uL (ref 0.0–0.5)
EOS%: 2 % (ref 0.0–7.0)
HCT: 38.3 % (ref 34.8–46.6)
HGB: 12.6 g/dL (ref 11.6–15.9)
LYMPH%: 28 % (ref 14.0–49.7)
MCH: 27.7 pg (ref 25.1–34.0)
MCHC: 32.9 g/dL (ref 31.5–36.0)
MCV: 84.4 fL (ref 79.5–101.0)
MONO#: 0.7 10*3/uL (ref 0.1–0.9)
MONO%: 8.2 % (ref 0.0–14.0)
NEUT#: 5.4 10*3/uL (ref 1.5–6.5)
NEUT%: 61 % (ref 38.4–76.8)
Platelets: 255 10*3/uL (ref 145–400)
RBC: 4.54 10*6/uL (ref 3.70–5.45)
RDW: 15.2 % — ABNORMAL HIGH (ref 11.2–14.5)
WBC: 8.8 10*3/uL (ref 3.9–10.3)
lymph#: 2.5 10*3/uL (ref 0.9–3.3)

## 2017-05-02 LAB — COMPREHENSIVE METABOLIC PANEL
ALBUMIN: 3.8 g/dL (ref 3.5–5.0)
ALK PHOS: 76 U/L (ref 40–150)
ALT: 7 U/L (ref 0–55)
AST: 11 U/L (ref 5–34)
Anion Gap: 9 mEq/L (ref 3–11)
BUN: 20.5 mg/dL (ref 7.0–26.0)
CO2: 26 meq/L (ref 22–29)
Calcium: 9.9 mg/dL (ref 8.4–10.4)
Chloride: 104 mEq/L (ref 98–109)
Creatinine: 1 mg/dL (ref 0.6–1.1)
EGFR: 52 mL/min/{1.73_m2} — AB (ref >90)
GLUCOSE: 121 mg/dL (ref 70–140)
POTASSIUM: 3.7 meq/L (ref 3.5–5.1)
SODIUM: 138 meq/L (ref 136–145)
Total Bilirubin: 0.28 mg/dL (ref 0.20–1.20)
Total Protein: 6.8 g/dL (ref 6.4–8.3)

## 2017-05-02 MED ORDER — FULVESTRANT 250 MG/5ML IM SOLN: 500.0000 mg | Freq: Once | INTRAMUSCULAR | Status: DC

## 2017-05-02 NOTE — Progress Notes (Signed)
ID: April [name]   DOB: [birth year]  MR#: [id number]  WVP#:[id number]  PCP: April Morale, MD GYN:  SU: April [name] OTHER MD: April [name], April [name], April [name], April [name]  CHIEF COMPLAINT: bilateral breast cancer, status post bilateral mastectomies CURRENT THERAPY: observation  BREAST CANCER HISTORY: According to April [name] 02/23/2011 note: She presented with a palpable mass in her left breast.  She was referred for a mammogram on 02/14/2011.  At that time diagnostic mammogram and ultrasound showed scattered fibroglandular densities in the lateral aspect of the left breast there was an irregular spiculated mass measuring 3.5 x 4.1 x 3.8 cm.  The physical exam at that time showed a hard palpable mass at 3 o'clock position in the left breast with some skin retraction.  Ultrasound was performed, which showed a hypoechoic mass with shadowing 10 cm from the nipple measuring 2.2 x 2.0 mm.  In the right breast there was two hypoechoic masses at 12 o'clock position 3 cm from the nipple measuring 5 x 5 mm each.  Biopsies recommended.  Biopsy on the suspicious lymph node was done at the same time is the primary mass on 02/14/2011.  Pathology showed this to be invasive mammary carcinoma.  The left axillary mass has insufficient tissue for diagnosis, the tumor was ER and PR positive, 100% strong staining intensity, proliferative index low at 36%.  The HER-2 ratio was 1.19.  The patient underwent a MRI scan of both breasts on 02/21/2011.  In the tail of the left breast there was a mass measuring 2.7 x 2.6 x 2.9 cm medial margin of both pectoralis muscle.  In the right breast there was clumped nodular enhancement at 12 o'clock position.  This area measured 1.8 cm.  Sentinel left axillary lymph node was also noted, which appeared to be suspicious. Her subsequent history is as detailed below.  INTERVAL HISTORY: April [name] returns today for evaluation of newly metastatic breast cancer.  She has h/o breast  cancer and over the winter had bronchitis that wouldn't resolve.  CT scan in February revealed lung mass, and PET and biopsy revealed breast primary with some lymph node involvement.  She is fatigued.  She lives alone and has been doing her activities of daily living independently  REVIEW OF SYSTEMS: Other than what is noted above, a detailed ROS was conducted and is non contributory.    PAST MEDICAL HISTORY: Past Medical History:  Diagnosis Date  . Adenomatous colon polyp 11/2005  . ALLERGIC RHINITIS 04/18/2007  . Anemia   . Arthritis   . Breast cancer [facility]) 2012    April [name], ER/PR positive L breast  . Bronchitis    taking levaquin now for it.  Still runs a low grade fever at times  . BURSITIS 01/04/2008  . COSTOCHONDRITIS 11/05/2007  . GERD 04/18/2007  . Hiatal hernia   . History of gallstones   . History of pneumonia   . Hx of radiation therapy 10/26/11 to 12/16/11   L chest wall, L supraclavicular region  . HYPERTENSION 09/30/2009  . Hypothyroidism    YRS AGO.....THINGS ARE FINE NOW  . Internal hemorrhoids   . LOW BACK PAIN SYNDROME 07/13/2009  . Melanoma ([place])    removed from upper back  . Pneumonia   . ROTATOR CUFF SYNDROME 05/06/2010    PAST SURGICAL HISTORY: Past Surgical History:  Procedure Laterality Date  . ANKLE SURGERY     right  . BREAST SURGERY  1968   left bx - noncancerous  .  CATARACT EXTRACTION     bilateral  . CHOLECYSTECTOMY  1979  . excision of back melanoma    . JOINT REPLACEMENT     left knee  . MASTECTOMY  08/25/11   bilateral , per April [name]  . SENTINEL LYMPH NODE BIOPSY  08/25/11   bilateral  . TONSILLECTOMY AND ADENOIDECTOMY    . TOTAL KNEE ARTHROPLASTY Right 03/27/2015   Procedure: TOTAL KNEE ARTHROPLASTY;  Surgeon: April Server, MD;  Location: [facility];  Service: Orthopedics;  Laterality: Right;    FAMILY HISTORY Family History  Problem Relation Age of Onset  . Stroke Father   . Alcohol abuse Sister   . Cirrhosis Sister   . Thyroid  cancer Mother   . Retinal detachment Mother   . Diabetes Paternal Uncle   . Asthma Maternal Grandmother    the patient's father died at the age of 11 following a stroke. He had an independent in driving up to one month prior to dying. [name] died from thyroid cancer at the age of 49. This had been diagnosed less than a year before her death. The patient had one brother and one sister. There is no history of breast or ovarian cancer in the family to her knowledge.  GYNECOLOGIC HISTORY: Menarche age 62, first live birth age 83, she is [place] P2. She underwent menopause around 54. She did not take hormone replacement.  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 Her husband April [name] is a retired Art gallery manager. Unfortunately she tells [name] he is severely demented at present. She is the primary caregiver. Son April [name] lives in [place] and works for the [employer]. Daughter April [name] lives in [place] and works with a [employer]. The patient has 3 grandchildren. She attends a [employer].   ADVANCED DIRECTIVES: In place  HEALTH MAINTENANCE: Social History  Substance Use Topics  . Smoking status: Passive Smoke Exposure - Never Smoker  . Smokeless tobacco: Never Used     Comment: as a child.   . Alcohol use 0.6 oz/week    1 Standard drinks or equivalent per week     Comment: socially     Colonoscopy:  PAP:  Bone density:  Lipid panel:  Allergies  Allergen Reactions  . Cefaclor Hives and Itching    Purple splotches  . Lansoprazole Hives and Itching    Purple splotches, hurting  . Penicillins Hives and Itching    Purple splotches  . Sulfa Drugs Cross Reactors Other (See Comments)    Purple whelps, itch, "just go crazy"    Current Outpatient Prescriptions  Medication Sig Dispense Refill  . albuterol (PROVENTIL HFA;VENTOLIN HFA) 108 (90 BASE) MCG/ACT inhaler Inhale 2  puffs into the lungs every 4 (four) hours as needed for wheezing or shortness of breath. 1 Inhaler 2  . ALPRAZolam (XANAX) 0.25 MG tablet Take 1 tablet (0.25 mg total) by mouth as directed. (Patient taking differently: Take 0.25 mg by mouth daily as needed for anxiety. ) 2 tablet 0  . fluticasone (FLONASE) 50 MCG/ACT nasal spray Place 1 spray into both nostrils 2 (two) times daily. (Patient taking differently: Place 1 spray into both nostrils daily as needed for allergies. ) 16 g 6  . ibuprofen (ADVIL,MOTRIN) 200 MG tablet Take 200 mg by mouth every 6 (six) hours as needed.    Marland Kitchen lisinopril (PRINIVIL,ZESTRIL) 20 MG tablet TAKE 1 TABLET ONCE DAILY. 90 tablet 1  .  omeprazole (PRILOSEC) 40 MG capsule TAKE (1) CAPSULE TWICE DAILY. 60 capsule 4  . Polyethyl Glycol-Propyl Glycol (SYSTANE) 0.4-0.3 % SOLN Place 1 drop into both eyes 2 (two) times daily as needed (dry eyes).     . prochlorperazine (COMPAZINE) 5 MG tablet Take 1 tablet (5 mg total) by mouth every 6 (six) hours as needed for nausea or vomiting. 30 tablet 0   No current facility-administered medications for this visit.    Facility-Administered Medications Ordered in Other Visits  Medication Dose Route Frequency Provider Last Rate Last Dose  . fulvestrant (FASLODEX) injection 500 mg  500 mg Intramuscular Once Magrinat, Virgie Dad, MD        OBJECTIVE:  Vitals:   05/02/17 1357  BP: (!) 143/65  Pulse: 86  Resp: 18  Temp: 97.9 F (36.6 C)     Body mass index is 28.22 kg/m.    ECOG FS: 1 GENERAL: Patient is a well appearing female in no acute distress HEENT:  Sclerae anicteric.  Oropharynx clear and moist. No ulcerations or evidence of oropharyngeal candidiasis. Neck is supple.  NODES:  No cervical, supraclavicular, or axillary lymphadenopathy palpated.  BREAST EXAM:  S/p bilateral mastectomies, no nodularity, on either side.  I am unable to tell where her recent biopsy was performed at.  No tenderness, no masses noted LUNGS:  Clear to  auscultation bilaterally.  No wheezes or rhonchi. HEART:  Regular rate and rhythm. No murmur appreciated. ABDOMEN:  Soft, nontender.  Positive, normoactive bowel sounds. No organomegaly palpated. MSK:  No focal spinal tenderness to palpation. Full range of motion bilaterally in the upper extremities. EXTREMITIES:  No peripheral edema.   SKIN:  Clear with no obvious rashes or skin changes. No nail dyscrasia. NEURO:  Nonfocal. Well oriented.  Appropriate affect.    LAB RESULTS: Lab Results  Component Value Date   WBC 8.8 05/02/2017   NEUTROABS 5.4 05/02/2017   HGB 12.6 05/02/2017   HCT 38.3 05/02/2017   MCV 84.4 05/02/2017   PLT 255 05/02/2017      Chemistry      Component Value Date/Time   NA 138 05/02/2017 1309   K 3.7 05/02/2017 1309   CL 104 01-May-2017 1152   CL 108 (H) 12/25/2012 1235   CO2 26 05/02/2017 1309   BUN 20.5 05/02/2017 1309   CREATININE 1.0 05/02/2017 1309      Component Value Date/Time   CALCIUM 9.9 05/02/2017 1309   ALKPHOS 76 05/02/2017 1309   AST 11 05/02/2017 1309   ALT 7 05/02/2017 1309   BILITOT 0.28 05/02/2017 1309       Lab Results  Component Value Date   LABCA2 12 12/25/2012    No components found for: [id number]  No results for input(s): INR in the last 168 hours.  Urinalysis    Component Value Date/Time   COLORURINE YELLOW 03/13/2015 1042   APPEARANCEUR CLEAR 03/13/2015 1042   LABSPEC 1.007 03/13/2015 1042   PHURINE 6.5 03/13/2015 1042   GLUCOSEU NEGATIVE 03/13/2015 1042   HGBUR NEGATIVE 03/13/2015 1042   HGBUR negative 05/24/2010 1555   BILIRUBINUR neg 09/08/2015 1557   KETONESUR NEGATIVE 03/13/2015 1042   PROTEINUR neg 09/08/2015 1557   PROTEINUR NEGATIVE 03/13/2015 1042   UROBILINOGEN 0.2 09/08/2015 1557   UROBILINOGEN 0.2 03/13/2015 1042   NITRITE neg 09/08/2015 1557   NITRITE NEGATIVE 03/13/2015 1042   LEUKOCYTESUR Negative 09/08/2015 1557    STUDIES: US Biopsy  Result Date: 05-01-2017 CLINICAL DATA:  History of  breast carcinoma.  Widespread malignancy in chest including a chest wall mass in the lateral left subpectoral region which is hypermetabolic by PET scan. The patient presents for ultrasound-guided biopsy of this mass. EXAM: ULTRASOUND GUIDED CORE BIOPSY OF LEFT CHEST WALL MASS COMPARISON:  PET scan on 03/20/2017 MEDICATIONS: 1.0 mg IV Versed; 50 mcg IV Fentanyl Total Moderate Sedation Time: 10 minutes. The patient's level of consciousness and physiologic status were continuously monitored during the procedure by Radiology nursing. PROCEDURE: The procedure, risks, benefits, and alternatives were explained to the patient. Questions regarding the procedure were encouraged and answered. The patient understands and consents to the procedure. A time out was performed prior to initiating the procedure. The left chest wall was prepped with chlorhexidine in a sterile fashion, and a sterile drape was applied covering the operative field. A sterile gown and sterile gloves were used for the procedure. Local anesthesia was provided with 1% Lidocaine. Under ultrasound guidance, 4 separate 16 gauge core biopsy samples were obtained through a left subpectoral chest wall mass. Core biopsy samples were submitted in formalin. COMPLICATIONS: None. FINDINGS: Ultrasound demonstrates a spiculated, hypoechoic solid mass measuring approximately 2.1 x 1.3 x 1.4 cm in the left lateral subpectoral region between the pectoralis major and minor muscles in corresponding exactly in location to the abnormality seen by recent PET scan. Solid tissue was obtained. IMPRESSION: Ultrasound-guided core biopsy performed of a 2.1 cm spiculated left lateral subpectoral chest wall mass. Electronically Signed   By: Aletta Edouard M.D.   On: 04/11/2017 16:46     ASSESSMENT: 83 y.o. 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  (2) status  post bilateral mastectomies 08/25/2011, showing  (a) on the right, ductal carcinoma in situ, low-grade, estrogen receptor 100% and progesterone receptor 80% positive, with ample margins  (b) on the left, a pT2 pN1, stage IIB invasive ductal carcinoma, grade 1, 100% estrogen and 100% progesterone receptor positive, with an MIB-1 of 36%, and no HER-2 amplification.  (3) left postmastectomy radiation completed 12/27/2011  (4) took aromatase inhibitors (letrozole and anastrozole) prior to her bilateral mastectomies, with poor tolerance. Took tamoxifen briefly after completing radiation, and again with poor tolerance. 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  (a) PET SCAN: Left upper lobe mass hypermetabolic with SUV max 137.1 Hypermetabolic subcarinal and contralateral right lower paratracheal metabolic adenopathy SUV 8.4, hypermetabolic right hilar lymph nodes SUV max 5.4, small hypermetabolic rigth supraclavicular lymph nodes, small but intense activity for size SUV max 4, Hypermetabolic activity along medial border of left pectoralis muscle SUV 5.8. Mildly anterior margin of the right kidney 3.6cm mass concerning for renal cell carcinoma.    (b) 04/11/2017: core biopsy of left subpectoral mass: invasive mammary carcinoma, ER+(95%), PR+(95%), Ki 67 2%, HER-2 negative (ratio 1.25).   (2) Fulvestrant to start on 05/08/2017 with plan to add Palbociclib on 06/05/2017.  Will repeat CT chest in  September-October, 2018.  PLAN:  I reviewed all of the scans and pathology results of April [name]'s metastatic breast cancer to her lung.  She met with Dr. Jana Hakim who reviewed everything with her in further detail, along with treatment with Fulvestrant, risks/benefits, and monitoring.  She will return next week to start Fulvestrant.    She knows to call in the interim should she have any questions or concerns.    Scot Dock    05/02/2017    ADDENDUM: I met with April [name] and her family and they understand she does not have lung cancer but rest cancer metastatic to the lung. There are also many lymph nodes which are likely also malignant. The renal mass has been found to be an oncocytoma in the past and is less urgent.  We discussed the options of no treatment, anti-estrogens, and chemotherapy, and the patient is a good candidate for anti-estrogens. We discussed fulvestrant and has a good understanding of the possible toxicities, side effects and complications of this agent. Hopefully we can get her started no later than next week.  I would plan to restage her after approximately 3 months on treatment.  She has a good understanding of this plan. She agrees with it. She knows the goal of treatment is control.  I personally saw this patient and performed a substantive portion of this encounter with the listed APP documented above.   Chauncey Cruel, MD Medical Oncology and Hematology [facility] [address] [place], [place] [address] Tel. [phone]    Fax. [phone]

## 2017-05-03 LAB — CANCER ANTIGEN 27.29: CA 27.29: 21.4 U/mL (ref 0.0–38.6)

## 2017-05-04 ENCOUNTER — Encounter: Payer: Self-pay | Admitting: Adult Health

## 2017-05-08 ENCOUNTER — Ambulatory Visit (HOSPITAL_BASED_OUTPATIENT_CLINIC_OR_DEPARTMENT_OTHER): Payer: Medicare Other

## 2017-05-08 ENCOUNTER — Other Ambulatory Visit: Payer: Self-pay

## 2017-05-08 ENCOUNTER — Other Ambulatory Visit: Payer: Self-pay | Admitting: Oncology

## 2017-05-08 ENCOUNTER — Other Ambulatory Visit (HOSPITAL_BASED_OUTPATIENT_CLINIC_OR_DEPARTMENT_OTHER): Payer: Medicare Other

## 2017-05-08 ENCOUNTER — Ambulatory Visit (HOSPITAL_COMMUNITY)
Admission: RE | Admit: 2017-05-08 | Discharge: 2017-05-08 | Disposition: A | Discharge status: Home / Self Care | Payer: Medicare Other | Source: Ambulatory Visit | Attending: Oncology | Admitting: Oncology

## 2017-05-08 VITALS — BP 161/63 | HR 88 | Temp 97.0°F | Resp 20

## 2017-05-08 DIAGNOSIS — C7802 Secondary malignant neoplasm of left lung: Secondary | ICD-10-CM

## 2017-05-08 DIAGNOSIS — C50912 Malignant neoplasm of unspecified site of left female breast: Secondary | ICD-10-CM

## 2017-05-08 DIAGNOSIS — Z170 Estrogen receptor positive status [ER+]: Principal | ICD-10-CM

## 2017-05-08 DIAGNOSIS — C50012 Malignant neoplasm of nipple and areola, left female breast: Secondary | ICD-10-CM | POA: Diagnosis present

## 2017-05-08 DIAGNOSIS — R918 Other nonspecific abnormal finding of lung field: Secondary | ICD-10-CM | POA: Insufficient documentation

## 2017-05-08 DIAGNOSIS — Z5111 Encounter for antineoplastic chemotherapy: Secondary | ICD-10-CM

## 2017-05-08 LAB — CBC WITH DIFFERENTIAL/PLATELET
BASO%: 0.9 % (ref 0.0–2.0)
BASOS ABS: 0.1 10*3/uL (ref 0.0–0.1)
EOS ABS: 0.2 10*3/uL (ref 0.0–0.5)
EOS%: 2.5 % (ref 0.0–7.0)
HCT: 38.1 % (ref 34.8–46.6)
HGB: 12.6 g/dL (ref 11.6–15.9)
LYMPH%: 31.5 % (ref 14.0–49.7)
MCH: 27.9 pg (ref 25.1–34.0)
MCHC: 33.2 g/dL (ref 31.5–36.0)
MCV: 84.2 fL (ref 79.5–101.0)
MONO#: 0.7 10*3/uL (ref 0.1–0.9)
MONO%: 8.2 % (ref 0.0–14.0)
NEUT#: 4.5 10*3/uL (ref 1.5–6.5)
NEUT%: 56.9 % (ref 38.4–76.8)
Platelets: 237 10*3/uL (ref 145–400)
RBC: 4.53 10*6/uL (ref 3.70–5.45)
RDW: 15.3 % — ABNORMAL HIGH (ref 11.2–14.5)
WBC: 8 10*3/uL (ref 3.9–10.3)
lymph#: 2.5 10*3/uL (ref 0.9–3.3)

## 2017-05-08 LAB — COMPREHENSIVE METABOLIC PANEL
ALBUMIN: 3.7 g/dL (ref 3.5–5.0)
ALK PHOS: 71 U/L (ref 40–150)
ALT: 6 U/L (ref 0–55)
AST: 11 U/L (ref 5–34)
Anion Gap: 8 mEq/L (ref 3–11)
BUN: 17.1 mg/dL (ref 7.0–26.0)
CHLORIDE: 107 meq/L (ref 98–109)
CO2: 25 mEq/L (ref 22–29)
Calcium: 10.2 mg/dL (ref 8.4–10.4)
Creatinine: 1 mg/dL (ref 0.6–1.1)
EGFR: 54 mL/min/{1.73_m2} — AB (ref >90)
GLUCOSE: 124 mg/dL (ref 70–140)
POTASSIUM: 3.8 meq/L (ref 3.5–5.1)
SODIUM: 141 meq/L (ref 136–145)
Total Bilirubin: 0.22 mg/dL (ref 0.20–1.20)
Total Protein: 6.6 g/dL (ref 6.4–8.3)

## 2017-05-08 MED ORDER — FULVESTRANT 250 MG/5ML IM SOLN
500.0000 mg | Freq: Once | INTRAMUSCULAR | Status: AC
  Administered 2017-05-08: 500 mg via INTRAMUSCULAR
  Filled 2017-05-08: qty 10

## 2017-05-08 NOTE — Patient Instructions (Signed)

## 2017-05-08 NOTE — Progress Notes (Unsigned)
Ms. Jurczyk son Marcello Moores was in visiting from [place]. He was eager to find out exactly what was going on with his mom. I went over everything with him and gave him a copy of a summary of her case.  He is planning to have her move to [place] to live with them. I think this is a good idea. I suggest that he find in oncologist that does a lot of breast cancer not far from where they live in one sets in place we can easily transfer care  She is scheduled for second fulvestrant shot in 2 weeks.  She is having a little bit more cough son putting her in for a chest x-ray today.

## 2017-05-09 ENCOUNTER — Other Ambulatory Visit: Payer: Self-pay | Admitting: Oncology

## 2017-05-19 ENCOUNTER — Other Ambulatory Visit: Payer: Self-pay | Admitting: *Deleted

## 2017-05-19 DIAGNOSIS — C50012 Malignant neoplasm of nipple and areola, left female breast: Secondary | ICD-10-CM

## 2017-05-22 ENCOUNTER — Ambulatory Visit (HOSPITAL_BASED_OUTPATIENT_CLINIC_OR_DEPARTMENT_OTHER): Payer: Medicare Other | Admitting: Adult Health

## 2017-05-22 ENCOUNTER — Ambulatory Visit (HOSPITAL_BASED_OUTPATIENT_CLINIC_OR_DEPARTMENT_OTHER): Payer: Medicare Other

## 2017-05-22 ENCOUNTER — Encounter: Payer: Self-pay | Admitting: Adult Health

## 2017-05-22 ENCOUNTER — Other Ambulatory Visit (HOSPITAL_BASED_OUTPATIENT_CLINIC_OR_DEPARTMENT_OTHER): Payer: Medicare Other

## 2017-05-22 VITALS — BP 123/54 | HR 70 | Temp 97.8°F | Resp 18 | Ht 62.0 in | Wt 153.1 lb

## 2017-05-22 DIAGNOSIS — C50912 Malignant neoplasm of unspecified site of left female breast: Secondary | ICD-10-CM

## 2017-05-22 DIAGNOSIS — Z5111 Encounter for antineoplastic chemotherapy: Secondary | ICD-10-CM

## 2017-05-22 DIAGNOSIS — Z86000 Personal history of in-situ neoplasm of breast: Secondary | ICD-10-CM

## 2017-05-22 DIAGNOSIS — Z170 Estrogen receptor positive status [ER+]: Secondary | ICD-10-CM | POA: Diagnosis not present

## 2017-05-22 DIAGNOSIS — C7802 Secondary malignant neoplasm of left lung: Secondary | ICD-10-CM | POA: Diagnosis not present

## 2017-05-22 DIAGNOSIS — Z85820 Personal history of malignant melanoma of skin: Secondary | ICD-10-CM

## 2017-05-22 DIAGNOSIS — C50012 Malignant neoplasm of nipple and areola, left female breast: Secondary | ICD-10-CM

## 2017-05-22 LAB — COMPREHENSIVE METABOLIC PANEL
ALBUMIN: 3.9 g/dL (ref 3.5–5.0)
ALK PHOS: 76 U/L (ref 40–150)
ALT: 8 U/L (ref 0–55)
ANION GAP: 8 meq/L (ref 3–11)
AST: 13 U/L (ref 5–34)
BILIRUBIN TOTAL: 0.4 mg/dL (ref 0.20–1.20)
BUN: 20.1 mg/dL (ref 7.0–26.0)
CALCIUM: 10.2 mg/dL (ref 8.4–10.4)
CO2: 27 meq/L (ref 22–29)
CREATININE: 0.9 mg/dL (ref 0.6–1.1)
Chloride: 104 mEq/L (ref 98–109)
EGFR: 56 mL/min/{1.73_m2} — ABNORMAL LOW (ref >90)
Glucose: 87 mg/dl (ref 70–140)
Potassium: 4.4 mEq/L (ref 3.5–5.1)
Sodium: 139 mEq/L (ref 136–145)
TOTAL PROTEIN: 6.9 g/dL (ref 6.4–8.3)

## 2017-05-22 LAB — CBC WITH DIFFERENTIAL/PLATELET
BASO%: 0.6 % (ref 0.0–2.0)
Basophils Absolute: 0.1 10*3/uL (ref 0.0–0.1)
EOS ABS: 0.1 10*3/uL (ref 0.0–0.5)
EOS%: 1.5 % (ref 0.0–7.0)
HEMATOCRIT: 38.5 % (ref 34.8–46.6)
HEMOGLOBIN: 12.5 g/dL (ref 11.6–15.9)
LYMPH%: 27.5 % (ref 14.0–49.7)
MCH: 28.1 pg (ref 25.1–34.0)
MCHC: 32.5 g/dL (ref 31.5–36.0)
MCV: 86.5 fL (ref 79.5–101.0)
MONO#: 0.8 10*3/uL (ref 0.1–0.9)
MONO%: 9.5 % (ref 0.0–14.0)
NEUT%: 60.9 % (ref 38.4–76.8)
NEUTROS ABS: 5.2 10*3/uL (ref 1.5–6.5)
PLATELETS: 262 10*3/uL (ref 145–400)
RBC: 4.45 10*6/uL (ref 3.70–5.45)
RDW: 15.1 % — AB (ref 11.2–14.5)
WBC: 8.6 10*3/uL (ref 3.9–10.3)
lymph#: 2.4 10*3/uL (ref 0.9–3.3)

## 2017-05-22 MED ORDER — FULVESTRANT 250 MG/5ML IM SOLN
500.0000 mg | Freq: Once | INTRAMUSCULAR | Status: AC
  Administered 2017-05-22: 500 mg via INTRAMUSCULAR
  Filled 2017-05-22: qty 10

## 2017-05-22 NOTE — Patient Instructions (Signed)
Palbociclib capsules What is this medicine? PALBOCICLIB (pal boe SYE klib) is a medicine that targets proteins in cancer cells and stops the cancer cells from growing. It is used to treat breast cancer. This medicine may be used for other purposes; ask your health care provider or pharmacist if you have questions. COMMON BRAND NAME(S): Ibrance What should I tell my health care provider before I take this medicine? They need to know if you have any of these conditions: -infection (especially a virus infection such as chickenpox, cold sores, or herpes) -low blood counts, like low white cell, platelet, or red cell counts -an unusual or allergic reaction to palbociclib, other medicines, foods, dyes, or preservatives -pregnant or trying to get pregnant -breast-feeding How should I use this medicine? Take this medicine by mouth with a glass of water. Follow the directions on the prescription label. Take this medicine with food. Avoid grapefruit and grapefruit juice while you are taking this medicine. Swallow the capsule whole. Do not cut, crush or chew this medicine. Take your medicine at regular intervals. Do not take it more often than directed. Do not stop taking except on your doctor's advice. Talk to your pediatrician regarding the use of this medicine in children. Special care may be needed. Overdosage: If you think you have taken too much of this medicine contact a poison control center or emergency room at once. NOTE: This medicine is only for you. Do not share this medicine with others. What if I miss a dose? If you miss a dose or vomit after taking a dose, do not take another dose on that day. Take your next dose at your regular time. What may interact with this medicine? This medicine may interact with the following medications: -alfentanil -antiviral medicines for HIV or AIDS -boceprevir -bosentan -carbamazepine -certain medicines for fungal infections like ketoconazole, itraconazole,  posaconazole, voriconazole -clarithromycin -cyclosporine -ergot alkaloids like dihydroergotamine, ergotamine -everolimus -fentanyl -grapefruit juice -midazolam -modafinil -nafcillin -nefazodone -phenobarbital -phenytoin -pimozide -quinidine -rifampin -sirolimus -St. John's Wort -tacrolimus -telaprevir -telithromycin -verapamil This list may not describe all possible interactions. Give your health care provider a list of all the medicines, herbs, non-prescription drugs, or dietary supplements you use. Also tell them if you smoke, drink alcohol, or use illegal drugs. Some items may interact with your medicine. What should I watch for while using this medicine? Visit your doctor for regular check ups. Report any side effects. Continue your course of treatment unless your doctor tells you to stop. You will need blood work done while you are taking this medicine. Do not become pregnant while taking this medicine or for at least 3 weeks after stopping it. Women should inform their doctor if they wish to become pregnant or think they might be pregnant. Men should not father a child while taking this medicine and for 3 months after stopping it. There is a potential for serious side effects to an unborn child. Men should inform their doctors if they wish to father a child later. This medicine may lower sperm counts. Talk to your health care professional or pharmacist for more information. Do not breast-feed an infant while taking this medicine or for 3 weeks after the last dose. Avoid taking products that contain aspirin, acetaminophen, ibuprofen, naproxen, or ketoprofen unless instructed by your doctor. These medicines may hide a fever. Be careful brushing and flossing your teeth or using a toothpick because you may get an infection or bleed more easily. If you have any dental work done, tell your   dentist you are receiving this medicine. Call your doctor or health care professional for advice if  you get a fever, chills or sore throat, or other symptoms of a cold or flu. Do not treat yourself. This drug decreases your body's ability to fight infections. Try to avoid being around people who are sick. This medicine may increase your risk to bruise or bleed. Call your doctor or health care professional if you notice any unusual bleeding. This drug may make you feel generally unwell. This is not uncommon, as chemotherapy can affect healthy cells as well as cancer cells. Report any side effects. Continue your course of treatment even though you feel ill unless your doctor tells you to stop. What side effects may I notice from receiving this medicine? Side effects that you should report to your doctor or health care professional as soon as possible: -allergic reactions like skin rash, itching or hives, swelling of the face, lips, or tongue -dizziness -mouth sores -low blood counts - this medicine may decrease the number of white blood cells, red blood cells and platelets. You may be at increased risk for infections and bleeding. -pain, tingling, numbness in the hands or feet -severe or persistent diarrhea, nausea, vomiting -signs and symptoms of infection like fever or chills; cough; sore throat; pain or trouble passing urine -signs of decreased platelets or bleeding - nosebleed, bruising, pinpoint red spots on the skin, black, tarry stools, blood in the urine -signs of decreased red blood cells - unusually weak or tired, feeling faint or lightheaded, falls Side effects that usually do not require medical attention (report to your doctor or health care professional if they continue or are bothersome): -decreased appetite -hair thinning or hair loss -mild diarrhea -nausea -weak or tired This list may not describe all possible side effects. Call your doctor for medical advice about side effects. You may report side effects to FDA at 1-800-FDA-1088. Where should I keep my medicine? Keep out of  the reach of children. Store between 20 and 25 degrees C (68 and 77 degrees F). Throw away any unused medicine after the expiration date. NOTE: This sheet is a summary. It may not cover all possible information. If you have questions about this medicine, talk to your doctor, pharmacist, or health care provider.  2018 Elsevier/Gold Standard (2016-03-03 16:30:07)  

## 2017-05-22 NOTE — Progress Notes (Signed)
ID: April [name]   DOB: [birth year]  MR#: [id number]  HWY#:[id number]  PCP: Laurey Morale, MD GYN:  SU: Rolm Bookbinder OTHER MD: Franchot Gallo, Kyung Rudd, Lucio Edward, Crista Luria  CHIEF COMPLAINT: bilateral breast cancer, status post bilateral mastectomies CURRENT THERAPY: observation  BREAST CANCER HISTORY: According to Dr. Julien Girt 02/23/2011 note: She presented with a palpable mass in her left breast.  She was referred for a mammogram on 02/14/2011.  At that time diagnostic mammogram and ultrasound showed scattered fibroglandular densities in the lateral aspect of the left breast there was an irregular spiculated mass measuring 3.5 x 4.1 x 3.8 cm.  The physical exam at that time showed a hard palpable mass at 3 o'clock position in the left breast with some skin retraction.  Ultrasound was performed, which showed a hypoechoic mass with shadowing 10 cm from the nipple measuring 2.2 x 2.0 mm.  In the right breast there was two hypoechoic masses at 12 o'clock position 3 cm from the nipple measuring 5 x 5 mm each.  Biopsies recommended.  Biopsy on the suspicious lymph node was done at the same time is the primary mass on 02/14/2011.  Pathology showed this to be invasive mammary carcinoma.  The left axillary mass has insufficient tissue for diagnosis, the tumor was ER and PR positive, 100% strong staining intensity, proliferative index low at 36%.  The HER-2 ratio was 1.19.  The patient underwent a MRI scan of both breasts on 02/21/2011.  In the tail of the left breast there was a mass measuring 2.7 x 2.6 x 2.9 cm medial margin of both pectoralis muscle.  In the right breast there was clumped nodular enhancement at 12 o'clock position.  This area measured 1.8 cm.  Sentinel left axillary lymph node was also noted, which appeared to be suspicious. Her subsequent history is as detailed below.  INTERVAL HISTORY: [name] is doing very well otdya.  She received her first injection with Fulvestrant two  weeks ago and tolerated it very well.  She is not having any issues today.  Her neighbor brought her to her appointment.  She appears to have tolerated the injection without any difficulty.     REVIEW OF SYSTEMS:  April [name] has no issues today.  She is considering moving to [place] with her son so she will have family nearby.  Otherwise, a detailed ROS was completely non contributory.    PAST MEDICAL HISTORY: Past Medical History:  Diagnosis Date  . Adenomatous colon polyp 11/2005  . ALLERGIC RHINITIS 04/18/2007  . Anemia   . Arthritis   . Breast cancer [facility]) 2012    Dr. [name], ER/PR positive L breast  . Bronchitis    taking levaquin now for it.  Still runs a low grade fever at times  . BURSITIS 01/04/2008  . COSTOCHONDRITIS 11/05/2007  . GERD 04/18/2007  . Hiatal hernia   . History of gallstones   . History of pneumonia   . Hx of radiation therapy 10/26/11 to 12/16/11   L chest wall, L supraclavicular region  . HYPERTENSION 09/30/2009  . Hypothyroidism    YRS AGO.....THINGS ARE FINE NOW  . Internal hemorrhoids   . LOW BACK PAIN SYNDROME 07/13/2009  . Melanoma ([place])    removed from upper back  . Pneumonia   . ROTATOR CUFF SYNDROME 05/06/2010    PAST SURGICAL HISTORY: Past Surgical History:  Procedure Laterality Date  . ANKLE SURGERY     right  . [place]  left bx - noncancerous  . CATARACT EXTRACTION     bilateral  . CHOLECYSTECTOMY  1979  . excision of back melanoma    . JOINT REPLACEMENT     left knee  . MASTECTOMY  08/25/11   bilateral , per Dr. Donne Hazel  . SENTINEL LYMPH NODE BIOPSY  08/25/11   bilateral  . TONSILLECTOMY AND ADENOIDECTOMY    . TOTAL KNEE ARTHROPLASTY Right 03/27/2015   Procedure: TOTAL KNEE ARTHROPLASTY;  Surgeon: [name], MD;  Location: [place];  Service: Orthopedics;  Laterality: Right;    FAMILY HISTORY Family History  Problem Relation Age of Onset  . Stroke Father   . Alcohol abuse Sister   . Cirrhosis Sister   . Thyroid cancer  Mother   . Retinal detachment Mother   . Diabetes Paternal Uncle   . Asthma Maternal Grandmother    the patient's father died at the age of 24 following a stroke. He had an independent in driving up to one month prior to dying. [name] died from thyroid cancer at the age of 108. This had been diagnosed less than a year before her death. The patient had one brother and one sister. There is no history of breast or ovarian cancer in the family to her knowledge.  GYNECOLOGIC HISTORY: Menarche age 89, first live birth age 84, she is [place] P2. She underwent menopause around 19. She did not take hormone replacement.  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 Her husband Casey Burkitt [facility] Junior is a retired Art gallery manager. Unfortunately she tells me he is severely demented at present. She is the primary caregiver. Son Casey Burkitt [place] the third lives in [place] and works for the [employer]. Daughter [name] lives in [place] and works with a [employer]. The patient has 3 grandchildren. She attends a [employer].   ADVANCED DIRECTIVES: In place  HEALTH MAINTENANCE: Social History  Substance Use Topics  . Smoking status: Passive Smoke Exposure - Never Smoker  . Smokeless tobacco: Never Used     Comment: as a child.   . Alcohol use 0.6 oz/week    1 Standard drinks or equivalent per week     Comment: socially     Colonoscopy:  PAP:  Bone density:  Lipid panel:  Allergies  Allergen Reactions  . Cefaclor Hives and Itching    Purple splotches  . Lansoprazole Hives and Itching    Purple splotches, hurting  . Penicillins Hives and Itching    Purple splotches  . Sulfa Drugs Cross Reactors Other (See Comments)    Purple whelps, itch, "just go crazy"    Current Outpatient Prescriptions  Medication Sig Dispense Refill  . albuterol (PROVENTIL HFA;VENTOLIN HFA) 108 (90 BASE) MCG/ACT inhaler Inhale 2 puffs  into the lungs every 4 (four) hours as needed for wheezing or shortness of breath. (Patient not taking: Reported on 05/02/2017) 1 Inhaler 2  . ALPRAZolam (XANAX) 0.25 MG tablet Take 1 tablet (0.25 mg total) by mouth as directed. (Patient not taking: Reported on 05/02/2017) 2 tablet 0  . fluticasone (FLONASE) 50 MCG/ACT nasal spray Place 1 spray into both nostrils 2 (two) times daily. (Patient not taking: Reported on 05/02/2017) 16 g 6  . guaifenesin (ROBITUSSIN) 100 MG/5ML syrup Take 100 mg by mouth at bedtime.    Marland Kitchen ibuprofen (ADVIL,MOTRIN) 200 MG tablet Take 200 mg by mouth every 6 (six) hours as needed.    Marland Kitchen lisinopril (PRINIVIL,ZESTRIL) 20  MG tablet TAKE 1 TABLET ONCE DAILY. 90 tablet 1  . omeprazole (PRILOSEC) 40 MG capsule TAKE (1) CAPSULE TWICE DAILY. 60 capsule 4  . Polyethyl Glycol-Propyl Glycol (SYSTANE) 0.4-0.3 % SOLN Place 1 drop into both eyes 2 (two) times daily as needed (dry eyes).     . prochlorperazine (COMPAZINE) 5 MG tablet Take 1 tablet (5 mg total) by mouth every 6 (six) hours as needed for nausea or vomiting. 30 tablet 0   No current facility-administered medications for this visit.     OBJECTIVE:  Vitals:   05/22/17 1202  BP: (!) 123/54  Pulse: 70  Resp: 18  Temp: 97.8 F (36.6 C)     Body mass index is 28 kg/m.    ECOG FS: 1 GENERAL: Patient is a well appearing female in no acute distress HEENT:  Sclerae anicteric.  Oropharynx clear and moist. No ulcerations or evidence of oropharyngeal candidiasis. Neck is supple.  NODES:  No cervical, supraclavicular, or axillary lymphadenopathy palpated.  BREAST EXAM:  deferred LUNGS:  Clear to auscultation bilaterally.  No wheezes or rhonchi. HEART:  Regular rate and rhythm. No murmur appreciated. ABDOMEN:  Soft, nontender.  Positive, normoactive bowel sounds. No organomegaly palpated. MSK:  No focal spinal tenderness to palpation. Full range of motion bilaterally in the upper extremities. EXTREMITIES:  No peripheral edema.    SKIN:  Clear with no obvious rashes or skin changes. No nail dyscrasia. NEURO:  Nonfocal. Well oriented.  Appropriate affect.    LAB RESULTS: Lab Results  Component Value Date   WBC 8.6 05/22/2017   NEUTROABS 5.2 05/22/2017   HGB 12.5 05/22/2017   HCT 38.5 05/22/2017   MCV 86.5 05/22/2017   PLT 262 05/22/2017      Chemistry      Component Value Date/Time   NA 141 05/08/2017 1501   K 3.8 05/08/2017 1501   CL 104 04/11/2017 1152   CL 108 (H) 12/25/2012 1235   CO2 25 05/08/2017 1501   BUN 17.1 05/08/2017 1501   CREATININE 1.0 05/08/2017 1501      Component Value Date/Time   CALCIUM 10.2 05/08/2017 1501   ALKPHOS 71 05/08/2017 1501   AST 11 05/08/2017 1501   ALT 6 05/08/2017 1501   BILITOT 0.22 05/08/2017 1501       Lab Results  Component Value Date   LABCA2 12 12/25/2012    No components found for: [id number]  No results for input(s): INR in the last 168 hours.  Urinalysis    Component Value Date/Time   COLORURINE YELLOW 03/13/2015 [place] 03/13/2015 1042   LABSPEC 1.007 03/13/2015 1042   PHURINE 6.5 03/13/2015 1042   GLUCOSEU NEGATIVE 03/13/2015 1042   HGBUR NEGATIVE 03/13/2015 1042   HGBUR negative 05/24/2010 1555   BILIRUBINUR neg 09/08/2015 1557   KETONESUR NEGATIVE 03/13/2015 1042   PROTEINUR neg 09/08/2015 1557   PROTEINUR NEGATIVE 03/13/2015 1042   UROBILINOGEN 0.2 09/08/2015 1557   UROBILINOGEN 0.2 03/13/2015 1042   NITRITE neg 09/08/2015 1557   NITRITE NEGATIVE 03/13/2015 1042   LEUKOCYTESUR Negative 09/08/2015 1557    STUDIES: Dg Chest 2 View  Result Date: 05/08/2017 CLINICAL DATA:  Cough and shortness of breath EXAM: CHEST  2 VIEW COMPARISON:  12/13/2016, PET-CT 03/20/2017 FINDINGS: Surgical clips in the left axilla. No acute consolidation or pleural effusion is seen. Asymmetric increased opacity in the left suprahilar region corresponding to mass noted on PET-CT. Atherosclerosis. No pneumothorax. IMPRESSION: 1. No acute  infiltrate or edema  2. Left hilar/suprahilar opacity, corresponding to CT demonstrated suspicious mass Electronically Signed   By: Donavan Foil M.D.   On: 05/08/2017 20:28     ASSESSMENT: 83 y.o. 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  (2) status post bilateral mastectomies 08/25/2011, showing  (a) on the right, ductal carcinoma in situ, low-grade, estrogen receptor 100% and progesterone receptor 80% positive, with ample margins  (b) on the left, a pT2 pN1, stage IIB invasive ductal carcinoma, grade 1, 100% estrogen and 100% progesterone receptor positive, with an MIB-1 of 36%, and no HER-2 amplification.  (3) left postmastectomy radiation completed 12/27/2011  (4) took aromatase inhibitors (letrozole and anastrozole) prior to her bilateral mastectomies, with poor tolerance. Took tamoxifen briefly after completing radiation, and again with poor tolerance. 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  (a) PET SCAN: Left upper lobe mass hypermetabolic with SUV max 149.2 Hypermetabolic subcarinal and contralateral right lower paratracheal metabolic adenopathy SUV 8.4, hypermetabolic right hilar lymph nodes SUV max 5.4, small hypermetabolic rigth supraclavicular lymph nodes, small but intense activity for size SUV max 4, Hypermetabolic activity along medial border of  left pectoralis muscle SUV 5.8. Mildly anterior margin of the right kidney 3.6cm mass concerning for renal cell carcinoma.    (b) 04/11/2017: core biopsy of left subpectoral mass: invasive mammary carcinoma, ER+(95%), PR+(95%), Ki 67 2%, HER-2 negative (ratio 1.25).   (2) Fulvestrant to start on 05/08/2017 with plan to add Palbociclib on 06/05/2017.  Will repeat CT chest in September-October, 2018.  PLAN:   LCarmellais doing well today.  Her labs are stable and I reviewed them with her today in detail.  She will proceed with Fulvestrant today, and again in 2 weeks after her appointment with Dr. MJana Hakim  I did give her some information in her AVS about Palbociclib so she can read about it prior to discussing it with Dr. MJana Hakimat her next appointment.    She knows to call for any questions or concerns prior to her next appointment here.    A total of (20) minutes of face-to-face time was spent with this patient with greater than 50% of that time in counseling and care-coordination.   LScot Dock   05/22/2017

## 2017-06-02 ENCOUNTER — Other Ambulatory Visit: Payer: Self-pay | Admitting: *Deleted

## 2017-06-02 DIAGNOSIS — Z170 Estrogen receptor positive status [ER+]: Principal | ICD-10-CM

## 2017-06-02 DIAGNOSIS — C50012 Malignant neoplasm of nipple and areola, left female breast: Secondary | ICD-10-CM

## 2017-06-05 ENCOUNTER — Ambulatory Visit (HOSPITAL_BASED_OUTPATIENT_CLINIC_OR_DEPARTMENT_OTHER): Payer: Medicare Other

## 2017-06-05 ENCOUNTER — Ambulatory Visit (HOSPITAL_BASED_OUTPATIENT_CLINIC_OR_DEPARTMENT_OTHER): Payer: Medicare Other | Admitting: Oncology

## 2017-06-05 ENCOUNTER — Other Ambulatory Visit (HOSPITAL_BASED_OUTPATIENT_CLINIC_OR_DEPARTMENT_OTHER): Payer: Medicare Other

## 2017-06-05 ENCOUNTER — Other Ambulatory Visit: Payer: Self-pay | Admitting: *Deleted

## 2017-06-05 DIAGNOSIS — C50912 Malignant neoplasm of unspecified site of left female breast: Secondary | ICD-10-CM | POA: Diagnosis not present

## 2017-06-05 DIAGNOSIS — Z5111 Encounter for antineoplastic chemotherapy: Secondary | ICD-10-CM

## 2017-06-05 DIAGNOSIS — C7800 Secondary malignant neoplasm of unspecified lung: Secondary | ICD-10-CM | POA: Insufficient documentation

## 2017-06-05 DIAGNOSIS — Z170 Estrogen receptor positive status [ER+]: Principal | ICD-10-CM

## 2017-06-05 DIAGNOSIS — C50812 Malignant neoplasm of overlapping sites of left female breast: Secondary | ICD-10-CM

## 2017-06-05 DIAGNOSIS — C50012 Malignant neoplasm of nipple and areola, left female breast: Secondary | ICD-10-CM

## 2017-06-05 DIAGNOSIS — C7802 Secondary malignant neoplasm of left lung: Secondary | ICD-10-CM

## 2017-06-05 LAB — COMPREHENSIVE METABOLIC PANEL
ALT: 8 U/L (ref 0–55)
AST: 12 U/L (ref 5–34)
Albumin: 3.7 g/dL (ref 3.5–5.0)
Alkaline Phosphatase: 66 U/L (ref 40–150)
Anion Gap: 6 mEq/L (ref 3–11)
BUN: 23.2 mg/dL (ref 7.0–26.0)
CALCIUM: 9.7 mg/dL (ref 8.4–10.4)
CHLORIDE: 106 meq/L (ref 98–109)
CO2: 25 meq/L (ref 22–29)
CREATININE: 0.9 mg/dL (ref 0.6–1.1)
EGFR: 57 mL/min/{1.73_m2} — ABNORMAL LOW (ref >90)
GLUCOSE: 115 mg/dL (ref 70–140)
POTASSIUM: 4.1 meq/L (ref 3.5–5.1)
SODIUM: 138 meq/L (ref 136–145)
Total Bilirubin: <0.22 mg/dL (ref 0.20–1.20)
Total Protein: 6.7 g/dL (ref 6.4–8.3)

## 2017-06-05 LAB — CBC WITH DIFFERENTIAL/PLATELET
BASO%: 0.3 % (ref 0.0–2.0)
Basophils Absolute: 0 10*3/uL (ref 0.0–0.1)
EOS%: 2.3 % (ref 0.0–7.0)
Eosinophils Absolute: 0.2 10*3/uL (ref 0.0–0.5)
HCT: 37 % (ref 34.8–46.6)
HGB: 12 g/dL (ref 11.6–15.9)
LYMPH#: 2.8 10*3/uL (ref 0.9–3.3)
LYMPH%: 31.8 % (ref 14.0–49.7)
MCH: 28 pg (ref 25.1–34.0)
MCHC: 32.4 g/dL (ref 31.5–36.0)
MCV: 86.2 fL (ref 79.5–101.0)
MONO#: 0.8 10*3/uL (ref 0.1–0.9)
MONO%: 9.2 % (ref 0.0–14.0)
NEUT#: 4.9 10*3/uL (ref 1.5–6.5)
NEUT%: 56.4 % (ref 38.4–76.8)
Platelets: 225 10*3/uL (ref 145–400)
RBC: 4.29 10*6/uL (ref 3.70–5.45)
RDW: 15.8 % — ABNORMAL HIGH (ref 11.2–14.5)
WBC: 8.7 10*3/uL (ref 3.9–10.3)

## 2017-06-05 MED ORDER — FULVESTRANT 250 MG/5ML IM SOLN
500.0000 mg | Freq: Once | INTRAMUSCULAR | Status: AC
  Administered 2017-06-05: 500 mg via INTRAMUSCULAR
  Filled 2017-06-05: qty 10

## 2017-06-05 MED ORDER — PALBOCICLIB 100 MG PO CAPS: 100.0000 mg | ORAL_CAPSULE | Freq: Every day | ORAL | 6 refills | Status: DC

## 2017-06-05 NOTE — Progress Notes (Signed)
ID: April [name]   DOB: [birth year]  MR#: [id number]  EYC#:[id number]  PCP: Laurey Morale, MD GYN:  SU: Rolm Bookbinder OTHER MD: Franchot Gallo, Kyung Rudd, Lucio Edward, Crista Luria  CHIEF COMPLAINT: bilateral breast cancer, status post bilateral mastectomies CURRENT THERAPY: observation  BREAST CANCER HISTORY: According to Dr. Julien Girt 02/23/2011 note: She presented with a palpable mass in her left breast.  She was referred for a mammogram on 02/14/2011.  At that time diagnostic mammogram and ultrasound showed scattered fibroglandular densities in the lateral aspect of the left breast there was an irregular spiculated mass measuring 3.5 x 4.1 x 3.8 cm.  The physical exam at that time showed a hard palpable mass at 3 o'clock position in the left breast with some skin retraction.  Ultrasound was performed, which showed a hypoechoic mass with shadowing 10 cm from the nipple measuring 2.2 x 2.0 mm.  In the right breast there was two hypoechoic masses at 12 o'clock position 3 cm from the nipple measuring 5 x 5 mm each.  Biopsies recommended.  Biopsy on the suspicious lymph node was done at the same time is the primary mass on 02/14/2011.  Pathology showed this to be invasive mammary carcinoma.  The left axillary mass has insufficient tissue for diagnosis, the tumor was ER and PR positive, 100% strong staining intensity, proliferative index low at 36%.  The HER-2 ratio was 1.19.  The patient underwent a MRI scan of both breasts on 02/21/2011.  In the tail of the left breast there was a mass measuring 2.7 x 2.6 x 2.9 cm medial margin of both pectoralis muscle.  In the right breast there was clumped nodular enhancement at 12 o'clock position.  This area measured 1.8 cm.  Sentinel left axillary lymph node was also noted, which appeared to be suspicious. Her subsequent history is as detailed below.  INTERVAL HISTORY: April [name] returns today for follow-up of her estrogen receptor positive metastatic breast  cancer accompanied by her friend Vaughan Basta. Autym was started on fulvestrant late May and will receive her fourth dose today. She is tolerating this with no side effects that she is aware of and particularly does not find the injections particularly uncomfortable.  At this point she is ready to start Ibrance.   REVIEW OF SYSTEMS: She denies unusual headaches, visual changes, nausea, vomiting, cough, phlegm production, pleurisy, shortness of breath, or change in bowel or bladder habits. She has some aches and pains which are not persistent or increasing in intensity and which are long-standing. Overall he detailed review of systems today was stable    PAST MEDICAL HISTORY: Past Medical History:  Diagnosis Date  . Adenomatous colon polyp 11/2005  . ALLERGIC RHINITIS 04/18/2007  . Anemia   . Arthritis   . Breast cancer [facility]) 2012    Dr. [name], ER/PR positive L breast  . Bronchitis    taking levaquin now for it.  Still runs a low grade fever at times  . BURSITIS 01/04/2008  . COSTOCHONDRITIS 11/05/2007  . GERD 04/18/2007  . Hiatal hernia   . History of gallstones   . History of pneumonia   . Hx of radiation therapy 10/26/11 to 12/16/11   L chest wall, L supraclavicular region  . HYPERTENSION 09/30/2009  . Hypothyroidism    YRS AGO.....THINGS ARE FINE NOW  . Internal hemorrhoids   . LOW BACK PAIN SYNDROME 07/13/2009  . Melanoma ([place])    removed from upper back  . Pneumonia   . ROTATOR CUFF  SYNDROME 05/06/2010    PAST SURGICAL HISTORY: Past Surgical History:  Procedure Laterality Date  . ANKLE SURGERY     right  . BREAST SURGERY  1968   left bx - noncancerous  . CATARACT EXTRACTION     bilateral  . CHOLECYSTECTOMY  1979  . excision of back melanoma    . JOINT REPLACEMENT     left knee  . MASTECTOMY  08/25/11   bilateral , per Dr. Donne Hazel  . SENTINEL LYMPH NODE BIOPSY  08/25/11   bilateral  . TONSILLECTOMY AND ADENOIDECTOMY    . TOTAL KNEE ARTHROPLASTY Right 03/27/2015   Procedure:  TOTAL KNEE ARTHROPLASTY;  Surgeon: [name], MD;  Location: [place];  Service: Orthopedics;  Laterality: Right;    FAMILY HISTORY Family History  Problem Relation Age of Onset  . Stroke Father   . Alcohol abuse Sister   . Cirrhosis Sister   . Thyroid cancer Mother   . Retinal detachment Mother   . Diabetes Paternal Uncle   . Asthma Maternal Grandmother    the patient's father died at the age of 88 following a stroke. He had an independent in driving up to one month prior to dying. [name] died from thyroid cancer at the age of 9. This had been diagnosed less than a year before her death. The patient had one brother and one sister. There is no history of breast or ovarian cancer in the family to her knowledge.  GYNECOLOGIC HISTORY: Menarche age 24, first live birth age 79, she is [place] P2. She underwent menopause around 51. She did not take hormone replacement.  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 She is a widow and lives by herself. Son April [name] the third lives in [place] and works for the [employer]. Daughter April [name] lives in [place] and works with a [employer]. The patient has 3 grandchildren. She attends a [employer].   ADVANCED DIRECTIVES: In place  HEALTH MAINTENANCE: Social History  Substance Use Topics  . Smoking status: Passive Smoke Exposure - Never Smoker  . Smokeless tobacco: Never Used     Comment: as a child.   . Alcohol use 0.6 oz/week    1 Standard drinks or equivalent per week     Comment: socially     Colonoscopy:  PAP:  Bone density:  Lipid panel:  Allergies  Allergen Reactions  . Cefaclor Hives and Itching    Purple splotches  . Lansoprazole Hives and Itching    Purple splotches, hurting  . Penicillins Hives and Itching    Purple splotches  . Sulfa Drugs Cross Reactors Other (See Comments)    Purple whelps, itch, "just go crazy"    Current  Outpatient Prescriptions  Medication Sig Dispense Refill  . albuterol (PROVENTIL HFA;VENTOLIN HFA) 108 (90 BASE) MCG/ACT inhaler Inhale 2 puffs into the lungs every 4 (four) hours as needed for wheezing or shortness of breath. (Patient not taking: Reported on 05/02/2017) 1 Inhaler 2  . ALPRAZolam (XANAX) 0.25 MG tablet Take 1 tablet (0.25 mg total) by mouth as directed. (Patient not taking: Reported on 05/02/2017) 2 tablet 0  . fluticasone (FLONASE) 50 MCG/ACT nasal spray Place 1 spray into both nostrils 2 (two) times daily. (Patient not taking: Reported on 05/02/2017) 16 g 6  . guaifenesin (ROBITUSSIN) 100 MG/5ML syrup Take 100 mg by mouth at bedtime.    Marland Kitchen ibuprofen (ADVIL,MOTRIN) 200 MG tablet Take 200 mg by  mouth every 6 (six) hours as needed.    Marland Kitchen lisinopril (PRINIVIL,ZESTRIL) 20 MG tablet TAKE 1 TABLET ONCE DAILY. 90 tablet 1  . omeprazole (PRILOSEC) 40 MG capsule TAKE (1) CAPSULE TWICE DAILY. 60 capsule 4  . palbociclib (IBRANCE) 100 MG capsule Take 1 capsule (100 mg total) by mouth daily with breakfast. Take whole with food. 21 capsule 6  . Polyethyl Glycol-Propyl Glycol (SYSTANE) 0.4-0.3 % SOLN Place 1 drop into both eyes 2 (two) times daily as needed (dry eyes).     . prochlorperazine (COMPAZINE) 5 MG tablet Take 1 tablet (5 mg total) by mouth every 6 (six) hours as needed for nausea or vomiting. 30 tablet 0   No current facility-administered medications for this visit.     OBJECTIVE: Older white woman who appears stated age 48:   06/05/17 1519  BP: (!) 122/56  Pulse: 70  Resp: 18  Temp: 98.3 F (36.8 C)     Body mass index is 28.92 kg/m.    ECOG FS: 1 Sclerae unicteric, pupils round and equal Oropharynx clear and moist No cervical or supraclavicular adenopathy Lungs no rales or rhonchi Heart regular rate and rhythm Abd soft, nontender, positive bowel sounds MSK no focal spinal tenderness, no upper extremity lymphedema Neuro: nonfocal, well oriented, appropriate  affect Breasts: Deferred   LAB RESULTS: Lab Results  Component Value Date   WBC 8.7 06/05/2017   NEUTROABS 4.9 06/05/2017   HGB 12.0 06/05/2017   HCT 37.0 06/05/2017   MCV 86.2 06/05/2017   PLT 225 06/05/2017      Chemistry      Component Value Date/Time   NA 138 06/05/2017 1456   K 4.1 06/05/2017 1456   CL 104 04/11/2017 1152   CL 108 (H) 12/25/2012 1235   CO2 25 06/05/2017 1456   BUN 23.2 06/05/2017 1456   CREATININE 0.9 06/05/2017 1456      Component Value Date/Time   CALCIUM 9.7 06/05/2017 1456   ALKPHOS 66 06/05/2017 1456   AST 12 06/05/2017 1456   ALT 8 06/05/2017 1456   BILITOT <0.22 06/05/2017 1456       Lab Results  Component Value Date   LABCA2 12 12/25/2012    No components found for: [id number]  No results for input(s): INR in the last 168 hours.  Urinalysis    Component Value Date/Time   COLORURINE YELLOW 03/13/2015 [place] 03/13/2015 1042   LABSPEC 1.007 03/13/2015 1042   PHURINE 6.5 03/13/2015 1042   GLUCOSEU NEGATIVE 03/13/2015 1042   HGBUR NEGATIVE 03/13/2015 1042   HGBUR negative 05/24/2010 1555   BILIRUBINUR neg 09/08/2015 1557   KETONESUR NEGATIVE 03/13/2015 1042   PROTEINUR neg 09/08/2015 1557   PROTEINUR NEGATIVE 03/13/2015 1042   UROBILINOGEN 0.2 09/08/2015 1557   UROBILINOGEN 0.2 03/13/2015 1042   NITRITE neg 09/08/2015 1557   NITRITE NEGATIVE 03/13/2015 1042   LEUKOCYTESUR Negative 09/08/2015 1557    STUDIES: Dg Chest 2 View  Result Date: 05/08/2017 CLINICAL DATA:  Cough and shortness of breath EXAM: CHEST  2 VIEW COMPARISON:  12/13/2016, PET-CT 03/20/2017 FINDINGS: Surgical clips in the left axilla. No acute consolidation or pleural effusion is seen. Asymmetric increased opacity in the left suprahilar region corresponding to mass noted on PET-CT. Atherosclerosis. No pneumothorax. IMPRESSION: 1. No acute infiltrate or edema 2. Left hilar/suprahilar opacity, corresponding to CT demonstrated suspicious mass  Electronically Signed   By: Donavan Foil M.D.   On: 05/08/2017 20:28     ASSESSMENT: 83  y.o. 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  (2) status post bilateral mastectomies 08/25/2011, showing  (a) on the right, ductal carcinoma in situ, low-grade, estrogen receptor 100% and progesterone receptor 80% positive, with ample margins  (b) on the left, a pT2 pN1, stage IIB invasive ductal carcinoma, grade 1, 100% estrogen and 100% progesterone receptor positive, with an MIB-1 of 36%, and no HER-2 amplification.  (3) left postmastectomy radiation completed 12/27/2011  (4) took aromatase inhibitors (letrozole and anastrozole) prior to her bilateral mastectomies, with poor tolerance. Took tamoxifen briefly after completing radiation, and again with poor tolerance. 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  (a) PET SCAN: Left upper lobe mass hypermetabolic with SUV max 150.3 Hypermetabolic subcarinal and contralateral right lower paratracheal metabolic adenopathy SUV 8.4, hypermetabolic right hilar lymph nodes SUV max 5.4, small hypermetabolic rigth supraclavicular lymph nodes, small but intense activity for size SUV max 4, Hypermetabolic activity along medial border of left pectoralis muscle SUV 5.8. Mildly  anterior margin of the right kidney 3.6cm mass concerning for renal cell carcinoma.    (b) 04/11/2017: core biopsy of left subpectoral mass: invasive mammary carcinoma, ER+(95%), PR+(95%), Ki 67 2%, HER-2 negative (ratio 1.25).   (c( CA-27-29 is noninformative  (2) Fulvestrant to start on 05/08/2017 with plan to add Palbociclib on 06/05/2017.  Will repeat CT chest in September-October, 2018.  (3) Ibrance/ palbociclib to be started as soon as available, starting dose 100 mg  PLAN:  LCanaryis tolerating the fulvestrant well and clinically is very stable.  Today we spent a little over 30 minutes going over her situation and discussing palbociclib. She understands when combined with fulvestrant, which she is receiving, it done as the time to disease progression. This was confirmed a separate study using an aromatase inhibitor.  We discussed the possible toxicities, side effects and complications of this agent. I have gone ahead and enter the order and I have alerted our oral chemotherapy pharmacy specialist to help u[place]orea[place]tain the drug at minimal cost to the patient if possible.  LCornellwill receive fulvestrant today. [facility]. She will see [name] again in a month and we will follow her lab work monthly while she is on the palbociclib.  We will likely obtain restaging studies in September.  She has a good understanding of the overall plan. She knows the goal of treatment in her case is control. She will call with any problems that may develop before the next visit.  MAGRINAT,GUSTAV C    06/05/2017

## 2017-06-06 ENCOUNTER — Telehealth: Payer: Self-pay | Admitting: Pharmacist

## 2017-06-06 ENCOUNTER — Telehealth: Payer: Self-pay | Admitting: Pharmacy Technician

## 2017-06-06 DIAGNOSIS — Z170 Estrogen receptor positive status [ER+]: Principal | ICD-10-CM

## 2017-06-06 DIAGNOSIS — C50012 Malignant neoplasm of nipple and areola, left female breast: Secondary | ICD-10-CM

## 2017-06-06 MED ORDER — PALBOCICLIB 100 MG PO CAPS: 100.0000 mg | ORAL_CAPSULE | Freq: Every day | ORAL | 6 refills | Status: DC

## 2017-06-06 MED FILL — IBRANCE 100 MG CAPSULE: 100 | 21 days supply | Qty: 21 | Fill #0

## 2017-06-06 NOTE — Telephone Encounter (Signed)
Oral Chemotherapy Pharmacist Encounter  Received new prescription for Ibrance for the treatment of metastatic hormone receptor positive, Her-2 negative breast cancer in conjunction with Faslodex injection  06/05/17 labs reviewed, OK for treatment  Current medication list in Epic assessed, no significant DDIs with April [name] identified  Prior authorization has been approved by [employer], copayment noted as $95 Patient states this is prohibitively expensive. We will use a manufacturer voucher to obtain 1st month supply and will ship from the [facility] on Tuesday 7/10 or Wednesday 7/11 for next day delivery to patient's [name].  Options for copayment assistance discussed with patient. There no foundation grants available for patient's diagnosis currently so will need to apply for manufacturer assistance. Patient to follow-up with Oral oncology Clinic on income. Manufacturer application will be updated in a separate encounter.  I spoke with patient for overview of new oral chemotherapy medication: Ibrance. Pt is doing well.   Counseled patient on administration, dosing, side effects, safe handling, and monitoring. Patient will take 1 capsule (132m total) by mouth once daily with breakfast for 21 days on and 7 days off. Patient will start the Ibrance with breakfast the day after it is received. Patient knows to avoid grapefruit or grapefruit juice while on Ibrance therapy.  Side effects include but not limited to: decreased white blood cells, fatigue, GI upset, and rash.  [name] understanding and appreciation.   All questions answered.  Oral Oncology Clinic will continue to follow.  Thank you,  JJohny Drilling PharmD, BCPS, BCOP 06/06/2017  9:09 AM Oral Oncology Clinic [phone]

## 2017-06-06 NOTE — Telephone Encounter (Signed)
.  Oral Oncology Patient Advocate Encounter  Prior Authorization request for [facility] has been submitted and approved.    PA# [id number]  Effective dates: 03/08/2017 through 06/05/2020  Oral Oncology Clinic will continue to follow.   Fabio Asa. Melynda Keller, [facility] Oral Oncology Patient Advocate [phone] 06/06/2017 9:15 AM

## 2017-06-06 NOTE — Telephone Encounter (Signed)
Oral Chemotherapy Pharmacist Encounter  I spoke with patient's son, Gershon Mussel, about Mrs. Arauz's new Ibrance prescription. Son stated they can afford the $95 copayment. First fill at $0 out of pocket cost with manufacturer voucher.  Anticipated start date: 06/08/17 Manufacturer recommends CBC check every 2 weeks for 1st 2 months of therapy  Oral Oncology Clinic phone number provided to son for further questions or concerns. Gershon Mussel did state that Nohea will be moving to [place] at some point in the recent future and will need assistance with coordination of care to new oncologist.  I then spoke with patient again, who stated she would like to apply for manufacturer assistance. She has located her tax documents and will bring them with her to her next office appointment.  Oral Oncology Clinic will continue to follow  Johny Drilling, PharmD, BCPS, BCOP 06/06/2017  11:39 AM Oral Oncology Clinic [phone]

## 2017-06-08 NOTE — Telephone Encounter (Signed)
Oral Chemotherapy Pharmacist Encounter  I called patient to follow-up on Ibrance delivery from the [facility]. Ibrance 125mg  capsules were delivered to patient's home yesterday (7/11). Patient took her 1st capsule of Ibrance 125mg  with breaskfast this morning (06/08/17), which she will continue on a 3 week on, 1 week off schedule.  Patient still with needs for [place] check in week of 7/23 for determination of Ibrance dose.  All questions answered. Patient knows to call the office with questions or concerns.  Oral Oncology Clinic will continue to follow.  Johny Drilling, PharmD, BCPS, BCOP 06/08/2017  12:58 PM Oral Oncology Clinic [phone]

## 2017-06-08 NOTE — Telephone Encounter (Signed)
LOS sent for a lab appointment on 7/23 - thanks [facility];

## 2017-06-09 ENCOUNTER — Telehealth: Payer: Self-pay | Admitting: Oncology

## 2017-06-09 NOTE — Telephone Encounter (Signed)
Scheduled appt per sch message from [facility] - left message with appt date and time and sent reminder letter in the mail.

## 2017-06-19 ENCOUNTER — Other Ambulatory Visit (HOSPITAL_BASED_OUTPATIENT_CLINIC_OR_DEPARTMENT_OTHER): Payer: Medicare Other

## 2017-06-19 DIAGNOSIS — Z170 Estrogen receptor positive status [ER+]: Principal | ICD-10-CM

## 2017-06-19 DIAGNOSIS — C50912 Malignant neoplasm of unspecified site of left female breast: Secondary | ICD-10-CM | POA: Diagnosis not present

## 2017-06-19 DIAGNOSIS — C50012 Malignant neoplasm of nipple and areola, left female breast: Secondary | ICD-10-CM

## 2017-06-19 LAB — COMPREHENSIVE METABOLIC PANEL
ALT: 9 U/L (ref 0–55)
AST: 9 U/L (ref 5–34)
Albumin: 3.7 g/dL (ref 3.5–5.0)
Alkaline Phosphatase: 62 U/L (ref 40–150)
Anion Gap: 8 mEq/L (ref 3–11)
BUN: 19.1 mg/dL (ref 7.0–26.0)
CO2: 27 mEq/L (ref 22–29)
Calcium: 9.9 mg/dL (ref 8.4–10.4)
Chloride: 106 mEq/L (ref 98–109)
Creatinine: 1 mg/dL (ref 0.6–1.1)
EGFR: 50 mL/min/{1.73_m2} — ABNORMAL LOW (ref >90)
Glucose: 83 mg/dl (ref 70–140)
Potassium: 4.2 mEq/L (ref 3.5–5.1)
Sodium: 140 mEq/L (ref 136–145)
Total Bilirubin: 0.33 mg/dL (ref 0.20–1.20)
Total Protein: 6.6 g/dL (ref 6.4–8.3)

## 2017-06-19 LAB — CBC WITH DIFFERENTIAL/PLATELET
BASO%: 0.6 % (ref 0.0–2.0)
BASOS ABS: 0 10*3/uL (ref 0.0–0.1)
EOS%: 1.5 % (ref 0.0–7.0)
Eosinophils Absolute: 0.1 10*3/uL (ref 0.0–0.5)
HEMATOCRIT: 34.6 % — AB (ref 34.8–46.6)
HEMOGLOBIN: 11.4 g/dL — AB (ref 11.6–15.9)
LYMPH#: 1.5 10*3/uL (ref 0.9–3.3)
LYMPH%: 33.7 % (ref 14.0–49.7)
MCH: 28 pg (ref 25.1–34.0)
MCHC: 32.9 g/dL (ref 31.5–36.0)
MCV: 85.1 fL (ref 79.5–101.0)
MONO#: 0.3 10*3/uL (ref 0.1–0.9)
MONO%: 6 % (ref 0.0–14.0)
NEUT#: 2.7 10*3/uL (ref 1.5–6.5)
NEUT%: 58.2 % (ref 38.4–76.8)
Platelets: 242 10*3/uL (ref 145–400)
RBC: 4.07 10*6/uL (ref 3.70–5.45)
RDW: 15.9 % — AB (ref 11.2–14.5)
WBC: 4.6 10*3/uL (ref 3.9–10.3)

## 2017-06-29 MED FILL — IBRANCE 100 MG CAPSULE: 100 | 21 days supply | Qty: 21 | Fill #1

## 2017-07-03 ENCOUNTER — Other Ambulatory Visit: Payer: Self-pay | Admitting: Family Medicine

## 2017-07-06 ENCOUNTER — Encounter: Payer: Self-pay | Admitting: Adult Health

## 2017-07-06 ENCOUNTER — Ambulatory Visit (HOSPITAL_BASED_OUTPATIENT_CLINIC_OR_DEPARTMENT_OTHER): Payer: Medicare Other

## 2017-07-06 ENCOUNTER — Ambulatory Visit (HOSPITAL_BASED_OUTPATIENT_CLINIC_OR_DEPARTMENT_OTHER): Payer: Medicare Other | Admitting: Adult Health

## 2017-07-06 ENCOUNTER — Other Ambulatory Visit (HOSPITAL_BASED_OUTPATIENT_CLINIC_OR_DEPARTMENT_OTHER): Payer: Medicare Other

## 2017-07-06 ENCOUNTER — Other Ambulatory Visit: Payer: Self-pay | Admitting: *Deleted

## 2017-07-06 VITALS — BP 125/48 | HR 69 | Temp 98.2°F | Resp 18 | Ht 62.0 in | Wt 162.1 lb

## 2017-07-06 DIAGNOSIS — C50912 Malignant neoplasm of unspecified site of left female breast: Secondary | ICD-10-CM

## 2017-07-06 DIAGNOSIS — Z170 Estrogen receptor positive status [ER+]: Principal | ICD-10-CM

## 2017-07-06 DIAGNOSIS — C7802 Secondary malignant neoplasm of left lung: Secondary | ICD-10-CM

## 2017-07-06 DIAGNOSIS — Z5111 Encounter for antineoplastic chemotherapy: Secondary | ICD-10-CM | POA: Diagnosis not present

## 2017-07-06 DIAGNOSIS — C50012 Malignant neoplasm of nipple and areola, left female breast: Secondary | ICD-10-CM

## 2017-07-06 LAB — CBC WITH DIFFERENTIAL/PLATELET
BASO%: 0.6 % (ref 0.0–2.0)
Basophils Absolute: 0 10*3/uL (ref 0.0–0.1)
EOS%: 1.3 % (ref 0.0–7.0)
Eosinophils Absolute: 0.1 10*3/uL (ref 0.0–0.5)
HEMATOCRIT: 33.9 % — AB (ref 34.8–46.6)
HGB: 11.1 g/dL — ABNORMAL LOW (ref 11.6–15.9)
LYMPH#: 1.8 10*3/uL (ref 0.9–3.3)
LYMPH%: 38.6 % (ref 14.0–49.7)
MCH: 28.8 pg (ref 25.1–34.0)
MCHC: 32.7 g/dL (ref 31.5–36.0)
MCV: 88.1 fL (ref 79.5–101.0)
MONO#: 0.2 10*3/uL (ref 0.1–0.9)
MONO%: 3.9 % (ref 0.0–14.0)
NEUT%: 55.6 % (ref 38.4–76.8)
NEUTROS ABS: 2.6 10*3/uL (ref 1.5–6.5)
PLATELETS: 263 10*3/uL (ref 145–400)
RBC: 3.85 10*6/uL (ref 3.70–5.45)
RDW: 16.7 % — AB (ref 11.2–14.5)
WBC: 4.7 10*3/uL (ref 3.9–10.3)

## 2017-07-06 LAB — COMPREHENSIVE METABOLIC PANEL
ALT: 9 U/L (ref 0–55)
ANION GAP: 7 meq/L (ref 3–11)
AST: 11 U/L (ref 5–34)
Albumin: 3.6 g/dL (ref 3.5–5.0)
Alkaline Phosphatase: 63 U/L (ref 40–150)
BILIRUBIN TOTAL: 0.28 mg/dL (ref 0.20–1.20)
BUN: 22.7 mg/dL (ref 7.0–26.0)
CO2: 27 meq/L (ref 22–29)
CREATININE: 1.1 mg/dL (ref 0.6–1.1)
Calcium: 9.8 mg/dL (ref 8.4–10.4)
Chloride: 107 mEq/L (ref 98–109)
EGFR: 47 mL/min/{1.73_m2} — ABNORMAL LOW (ref >90)
GLUCOSE: 101 mg/dL (ref 70–140)
Potassium: 4.1 mEq/L (ref 3.5–5.1)
SODIUM: 141 meq/L (ref 136–145)
TOTAL PROTEIN: 6.4 g/dL (ref 6.4–8.3)

## 2017-07-06 MED ORDER — FULVESTRANT 250 MG/5ML IM SOLN
500.0000 mg | Freq: Once | INTRAMUSCULAR | Status: AC
  Administered 2017-07-06: 500 mg via INTRAMUSCULAR
  Filled 2017-07-06: qty 10

## 2017-07-06 NOTE — Patient Instructions (Signed)

## 2017-07-06 NOTE — Progress Notes (Signed)
ID: April [name]   DOB: [birth year]  MR#: [id number]  WUJ#:[id number]  PCP: Laurey Morale, MD GYN:  SU: Rolm Bookbinder OTHER MD: Franchot Gallo, Kyung Rudd, Lucio Edward, [facility]  CHIEF COMPLAINT: bilateral breast cancer, status post bilateral mastectomies CURRENT THERAPY: Fulvestrant and Palbociclib.   BREAST CANCER HISTORY: According to Dr. Julien Girt 02/23/2011 note: She presented with a palpable mass in her left breast.  She was referred for a mammogram on 02/14/2011.  At that time diagnostic mammogram and ultrasound showed scattered fibroglandular densities in the lateral aspect of the left breast there was an irregular spiculated mass measuring 3.5 x 4.1 x 3.8 cm.  The physical exam at that time showed a hard palpable mass at 3 o'clock position in the left breast with some skin retraction.  Ultrasound was performed, which showed a hypoechoic mass with shadowing 10 cm from the nipple measuring 2.2 x 2.0 mm.  In the right breast there was two hypoechoic masses at 12 o'clock position 3 cm from the nipple measuring 5 x 5 mm each.  Biopsies recommended.  Biopsy on the suspicious lymph node was done at the same time is the primary mass on 02/14/2011.  Pathology showed this to be invasive mammary carcinoma.  The left axillary mass has insufficient tissue for diagnosis, the tumor was ER and PR positive, 100% strong staining intensity, proliferative index low at 36%.  The HER-2 ratio was 1.19.  The patient underwent a MRI scan of both breasts on 02/21/2011.  In the tail of the left breast there was a mass measuring 2.7 x 2.6 x 2.9 cm medial margin of both pectoralis muscle.  In the right breast there was clumped nodular enhancement at 12 o'clock position.  This area measured 1.8 cm.  Sentinel left axillary lymph node was also noted, which appeared to be suspicious. Her subsequent history is as detailed below.  INTERVAL HISTORY: April [name] is here today for evaluation of her metastatic breast cancer.  She  is currently receiving Palbociclib and Fulvestrant for this and is tolerating it very well.  She denies any fatigue, fevers, chills or any other concerns.    REVIEW OF SYSTEMS: April [name] denies any new pain.  A detailed ROS was conducted and is non contributory.    PAST MEDICAL HISTORY: Past Medical History:  Diagnosis Date  . Adenomatous colon polyp 11/2005  . ALLERGIC RHINITIS 04/18/2007  . Anemia   . Arthritis   . Breast cancer [name]) 2012    Dr. [name], ER/PR positive L breast  . Bronchitis    taking levaquin now for it.  Still runs a low grade fever at times  . BURSITIS 01/04/2008  . COSTOCHONDRITIS 11/05/2007  . GERD 04/18/2007  . Hiatal hernia   . History of gallstones   . History of pneumonia   . Hx of radiation therapy 10/26/11 to 12/16/11   L chest wall, L supraclavicular region  . HYPERTENSION 09/30/2009  . Hypothyroidism    YRS AGO.....THINGS ARE FINE NOW  . Internal hemorrhoids   . LOW BACK PAIN SYNDROME 07/13/2009  . Melanoma ([place])    removed from upper back  . Pneumonia   . ROTATOR CUFF SYNDROME 05/06/2010    PAST SURGICAL HISTORY: Past Surgical History:  Procedure Laterality Date  . ANKLE SURGERY     right  . BREAST SURGERY  1968   left bx - noncancerous  . CATARACT EXTRACTION     bilateral  . CHOLECYSTECTOMY  1979  . excision of back melanoma    .  JOINT REPLACEMENT     left knee  . MASTECTOMY  08/25/11   bilateral , per Dr. Donne Hazel  . SENTINEL LYMPH NODE BIOPSY  08/25/11   bilateral  . TONSILLECTOMY AND ADENOIDECTOMY    . TOTAL KNEE ARTHROPLASTY Right 03/27/2015   Procedure: TOTAL KNEE ARTHROPLASTY;  Surgeon: [name], MD;  [facility];  Service: Orthopedics;  Laterality: Right;    FAMILY HISTORY Family History  Problem Relation Age of Onset  . Stroke Father   . Alcohol abuse Sister   . Cirrhosis Sister   . Thyroid cancer Mother   . Retinal detachment Mother   . Diabetes Paternal Uncle   . Asthma Maternal Grandmother    the patient's father  died at the age of 73 following a stroke. He had an independent in driving up to one month prior to dying. [name] died from thyroid cancer at the age of 59. This had been diagnosed less than a year before her death. The patient had one brother and one sister. There is no history of breast or ovarian cancer in the family to her knowledge.  GYNECOLOGIC HISTORY: Menarche age 41, first live birth age 36, she is [place] P2. She underwent menopause around 48. She did not take hormone replacement.  SOCIAL HISTORY: [place] has always worked as a housewife. She is a widow and lives by herself. Son [name] the third lives in [place] and works for the [employer]. Daughter [name] lives in [place] and works with a [employer]. The patient has 3 grandchildren. She attends a [employer].   ADVANCED DIRECTIVES: In place  HEALTH MAINTENANCE: Social History  Substance Use Topics  . Smoking status: Passive Smoke Exposure - Never Smoker  . Smokeless tobacco: Never Used     Comment: as a child.   . Alcohol use 0.6 oz/week    1 Standard drinks or equivalent per week     Comment: socially     Colonoscopy:  PAP:  Bone density:  Lipid panel:  Allergies  Allergen Reactions  . Cefaclor Hives and Itching    Purple splotches  . Lansoprazole Hives and Itching    Purple splotches, hurting  . Penicillins Hives and Itching    Purple splotches  . Sulfa Drugs Cross Reactors Other (See Comments)    Purple whelps, itch, "just go crazy"    Current Outpatient Prescriptions  Medication Sig Dispense Refill  . lisinopril (PRINIVIL,ZESTRIL) 20 MG tablet TAKE 1 TABLET ONCE DAILY. 90 tablet 1  . omeprazole (PRILOSEC) 40 MG capsule TAKE (1) CAPSULE TWICE DAILY. 60 capsule 5  . palbociclib (IBRANCE) 100 MG capsule Take 1 capsule (100 mg total) by mouth daily with breakfast. Take for 21 days on, 7 days off. 21 capsule 6  . albuterol (PROVENTIL  HFA;VENTOLIN HFA) 108 (90 BASE) MCG/ACT inhaler Inhale 2 puffs into the lungs every 4 (four) hours as needed for wheezing or shortness of breath. (Patient not taking: Reported on 07/06/2017) 1 Inhaler 2  . ALPRAZolam (XANAX) 0.25 MG tablet Take 1 tablet (0.25 mg total) by mouth as directed. (Patient not taking: Reported on 07/06/2017) 2 tablet 0  . fluticasone (FLONASE) 50 MCG/ACT nasal spray Place 1 spray into both nostrils 2 (two) times daily. (Patient not taking: Reported on 07/06/2017) 16 g 6  . guaifenesin (ROBITUSSIN) 100 MG/5ML syrup Take 100 mg by mouth at bedtime.    Marland Kitchen ibuprofen (ADVIL,MOTRIN) 200 MG tablet Take 200 mg by mouth  every 6 (six) hours as needed.    Vladimir Faster Glycol-Propyl Glycol (SYSTANE) 0.4-0.3 % SOLN Place 1 drop into both eyes 2 (two) times daily as needed (dry eyes).     . prochlorperazine (COMPAZINE) 5 MG tablet Take 1 tablet (5 mg total) by mouth every 6 (six) hours as needed for nausea or vomiting. (Patient not taking: Reported on 07/06/2017) 30 tablet 0   No current facility-administered medications for this visit.     OBJECTIVE:  Vitals:   07/06/17 1301  BP: (!) 125/48  Pulse: 69  Resp: 18  Temp: 98.2 F (36.8 C)  SpO2: 98%     Body mass index is 29.65 kg/m.    ECOG FS: 1 GENERAL: Patient is a well appearing female in no acute distress HEENT:  Sclerae anicteric.  Oropharynx clear and moist. No ulcerations or evidence of oropharyngeal candidiasis. Neck is supple.  NODES:  No cervical, supraclavicular, or axillary lymphadenopathy palpated.  BREAST EXAM:  Deferred. LUNGS:  Clear to auscultation bilaterally.  No wheezes or rhonchi. HEART:  Regular rate and rhythm. No murmur appreciated. ABDOMEN:  Soft, nontender.  Positive, normoactive bowel sounds. No organomegaly palpated. MSK:  No focal spinal tenderness to palpation. Full range of motion bilaterally in the upper extremities. EXTREMITIES:  No peripheral edema.   SKIN:  Clear with no obvious rashes or skin  changes. No nail dyscrasia. NEURO:  Nonfocal. Well oriented.  Appropriate affect.    LAB RESULTS: Lab Results  Component Value Date   WBC 4.7 07/06/2017   NEUTROABS 2.6 07/06/2017   HGB 11.1 (L) 07/06/2017   HCT 33.9 (L) 07/06/2017   MCV 88.1 07/06/2017   PLT 263 07/06/2017      Chemistry      Component Value Date/Time   NA 140 06/19/2017 1022   K 4.2 06/19/2017 1022   CL 104 04/11/2017 1152   CL 108 (H) 12/25/2012 1235   CO2 27 06/19/2017 1022   BUN 19.1 06/19/2017 1022   CREATININE 1.0 06/19/2017 1022      Component Value Date/Time   CALCIUM 9.9 06/19/2017 1022   ALKPHOS 62 06/19/2017 1022   AST 9 06/19/2017 1022   ALT 9 06/19/2017 1022   BILITOT 0.33 06/19/2017 1022       Lab Results  Component Value Date   LABCA2 12 12/25/2012    No components found for: [id number]  No results for input(s): INR in the last 168 hours.  Urinalysis    Component Value Date/Time   COLORURINE YELLOW 03/13/2015 1042   APPEARANCEUR CLEAR 03/13/2015 1042   LABSPEC 1.007 03/13/2015 1042   PHURINE 6.5 03/13/2015 1042   GLUCOSEU NEGATIVE 03/13/2015 1042   HGBUR NEGATIVE 03/13/2015 1042   HGBUR negative 05/24/2010 1555   BILIRUBINUR neg 09/08/2015 1557   KETONESUR NEGATIVE 03/13/2015 1042   PROTEINUR neg 09/08/2015 1557   PROTEINUR NEGATIVE 03/13/2015 1042   UROBILINOGEN 0.2 09/08/2015 1557   UROBILINOGEN 0.2 03/13/2015 1042   NITRITE neg 09/08/2015 1557   NITRITE NEGATIVE 03/13/2015 1042   LEUKOCYTESUR Negative 09/08/2015 1557    STUDIES: No results found.   ASSESSMENT: 83 y.o. 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  (2) status post bilateral mastectomies 08/25/2011, showing  (a) on the right, ductal carcinoma in situ, low-grade, estrogen receptor 100% and progesterone receptor 80% positive, with ample margins  (b) on the left, a pT2 pN1,  stage  IIB invasive ductal carcinoma, grade 1, 100% estrogen and 100% progesterone receptor positive, with an MIB-1 of 36%, and no HER-2 amplification.  (3) left postmastectomy radiation completed 12/27/2011  (4) took aromatase inhibitors (letrozole and anastrozole) prior to her bilateral mastectomies, with poor tolerance. Took tamoxifen briefly after completing radiation, and again with poor tolerance. 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  (a) PET SCAN: Left upper lobe mass hypermetabolic with SUV max 182.8 Hypermetabolic subcarinal and contralateral right lower paratracheal metabolic adenopathy SUV 8.4, hypermetabolic right hilar lymph nodes SUV max 5.4, small hypermetabolic rigth supraclavicular lymph nodes, small but intense activity for size SUV max 4, Hypermetabolic activity along medial border of left pectoralis muscle SUV 5.8. Mildly anterior margin of the right kidney 3.6cm mass concerning for renal cell carcinoma.    (b) 04/11/2017: core biopsy of left subpectoral mass: invasive mammary carcinoma, ER+(95%), PR+(95%), Ki 67 2%, HER-2 negative (ratio 1.25).   (c( CA-27-29 is noninformative  (2) Fulvestrant to start on 05/08/2017 with plan to add Palbociclib on 06/05/2017.  Will repeat CT chest in September-October, 2018.  (3) Ibrance/ palbociclib, starting dose 100 mg started around 06/06/2017  PLAN:  LAlexsysis tolerating  the fulvestrant and Palbociclib very well.  She will proceed with the Fulvestrant today.  I reviewed her cbc with her and it was normal.  She does not have any new symptoms.  She will return in 4 weeks for labs and follow up with Dr. MJana Hakim along with an injection.  She will discuss restaging scans at that appointment with him.    She has a good understanding of the overall plan. She knows the goal of treatment in her case is control. She will call with any problems that may develop before the next visit.  A total of (20) minutes of face-to-face time was spent with this patient with greater than 50% of that time in counseling and care-coordination.   LScot Dock   07/06/2017

## 2017-07-25 ENCOUNTER — Encounter: Payer: Self-pay | Admitting: Family Medicine

## 2017-07-25 ENCOUNTER — Ambulatory Visit (INDEPENDENT_AMBULATORY_CARE_PROVIDER_SITE_OTHER): Payer: Medicare Other | Admitting: Family Medicine

## 2017-07-25 VITALS — BP 128/58 | Temp 98.3°F | Ht 62.0 in | Wt 168.0 lb

## 2017-07-25 DIAGNOSIS — J018 Other acute sinusitis: Secondary | ICD-10-CM | POA: Diagnosis not present

## 2017-07-25 MED ORDER — LEVOFLOXACIN 500 MG PO TABS: 500.0000 mg | ORAL_TABLET | Freq: Every day | ORAL | 0 refills | Status: AC

## 2017-07-25 MED FILL — IBRANCE 100 MG CAPSULE: 100 | 21 days supply | Qty: 21 | Fill #2

## 2017-07-25 NOTE — Progress Notes (Signed)
   Subjective:    Patient ID: April [name], female    DOB: 03/01/1929, 83 y.o.   MRN: [id number]  HPI Here for 3 days of sinus drainage, ST, and a dry cough. No fever.    Review of Systems  Constitutional: Negative.   HENT: Positive for congestion, postnasal drip, sinus pressure and sore throat. Negative for sinus pain.   Eyes: Negative.   Respiratory: Positive for cough.        Objective:   Physical Exam  Constitutional: She appears well-developed and well-nourished.  HENT:  Right Ear: External ear normal.  Left Ear: External ear normal.  Nose: Nose normal.  Mouth/Throat: Oropharynx is clear and moist.  Eyes: Conjunctivae are normal.  Neck: Neck supple. No thyromegaly present.  Pulmonary/Chest: Effort normal and breath sounds normal. No respiratory distress. She has no wheezes. She has no rales.  Lymphadenopathy:    She has no cervical adenopathy.          Assessment & Plan:  Sinusitis, treat with Levaquin.  Alysia Penna, MD

## 2017-07-25 NOTE — Patient Instructions (Signed)
WE NOW OFFER   April [name]'s FAST TRACK!!!  SAME DAY Appointments for ACUTE CARE  Such as: Sprains, Injuries, cuts, abrasions, rashes, muscle pain, joint pain, back pain Colds, flu, sore throats, headache, allergies, cough, fever  Ear pain, sinus and eye infections Abdominal pain, nausea, vomiting, diarrhea, upset stomach Animal/insect bites  3 Easy Ways to Schedule: Walk-In Scheduling Call in scheduling Mychart Sign-up: [email or web address]         

## 2017-08-01 ENCOUNTER — Other Ambulatory Visit: Payer: Self-pay | Admitting: Family Medicine

## 2017-08-03 ENCOUNTER — Other Ambulatory Visit: Payer: Self-pay | Admitting: *Deleted

## 2017-08-03 ENCOUNTER — Other Ambulatory Visit (HOSPITAL_BASED_OUTPATIENT_CLINIC_OR_DEPARTMENT_OTHER): Payer: Medicare Other

## 2017-08-03 ENCOUNTER — Ambulatory Visit (HOSPITAL_BASED_OUTPATIENT_CLINIC_OR_DEPARTMENT_OTHER): Payer: Medicare Other | Admitting: Oncology

## 2017-08-03 ENCOUNTER — Ambulatory Visit (HOSPITAL_BASED_OUTPATIENT_CLINIC_OR_DEPARTMENT_OTHER): Payer: Medicare Other

## 2017-08-03 VITALS — BP 141/66 | HR 72 | Temp 98.7°F | Resp 16 | Ht 62.0 in | Wt 166.3 lb

## 2017-08-03 DIAGNOSIS — C7802 Secondary malignant neoplasm of left lung: Secondary | ICD-10-CM | POA: Diagnosis not present

## 2017-08-03 DIAGNOSIS — Z170 Estrogen receptor positive status [ER+]: Secondary | ICD-10-CM

## 2017-08-03 DIAGNOSIS — C7800 Secondary malignant neoplasm of unspecified lung: Secondary | ICD-10-CM

## 2017-08-03 DIAGNOSIS — Z5111 Encounter for antineoplastic chemotherapy: Secondary | ICD-10-CM

## 2017-08-03 DIAGNOSIS — C50012 Malignant neoplasm of nipple and areola, left female breast: Secondary | ICD-10-CM

## 2017-08-03 DIAGNOSIS — C50812 Malignant neoplasm of overlapping sites of left female breast: Secondary | ICD-10-CM

## 2017-08-03 DIAGNOSIS — C50912 Malignant neoplasm of unspecified site of left female breast: Secondary | ICD-10-CM

## 2017-08-03 LAB — COMPREHENSIVE METABOLIC PANEL
ALT: 6 U/L (ref 0–55)
AST: 11 U/L (ref 5–34)
Albumin: 3.6 g/dL (ref 3.5–5.0)
Alkaline Phosphatase: 53 U/L (ref 40–150)
Anion Gap: 8 mEq/L (ref 3–11)
BILIRUBIN TOTAL: 0.35 mg/dL (ref 0.20–1.20)
BUN: 22.3 mg/dL (ref 7.0–26.0)
CO2: 26 meq/L (ref 22–29)
Calcium: 9.7 mg/dL (ref 8.4–10.4)
Chloride: 106 mEq/L (ref 98–109)
Creatinine: 1.1 mg/dL (ref 0.6–1.1)
EGFR: 47 mL/min/{1.73_m2} — AB (ref >90)
GLUCOSE: 86 mg/dL (ref 70–140)
Potassium: 4 mEq/L (ref 3.5–5.1)
SODIUM: 140 meq/L (ref 136–145)
TOTAL PROTEIN: 6.5 g/dL (ref 6.4–8.3)

## 2017-08-03 LAB — CBC WITH DIFFERENTIAL/PLATELET
BASO%: 1.5 % (ref 0.0–2.0)
BASOS ABS: 0.1 10*3/uL (ref 0.0–0.1)
EOS ABS: 0.1 10*3/uL (ref 0.0–0.5)
EOS%: 1.5 % (ref 0.0–7.0)
HEMATOCRIT: 32.3 % — AB (ref 34.8–46.6)
HEMOGLOBIN: 10.7 g/dL — AB (ref 11.6–15.9)
LYMPH#: 1 10*3/uL (ref 0.9–3.3)
LYMPH%: 29.9 % (ref 14.0–49.7)
MCH: 30.1 pg (ref 25.1–34.0)
MCHC: 33.1 g/dL (ref 31.5–36.0)
MCV: 90.7 fL (ref 79.5–101.0)
MONO#: 0.2 10*3/uL (ref 0.1–0.9)
MONO%: 6.5 % (ref 0.0–14.0)
NEUT#: 2.1 10*3/uL (ref 1.5–6.5)
NEUT%: 60.6 % (ref 38.4–76.8)
PLATELETS: 214 10*3/uL (ref 145–400)
RBC: 3.56 10*6/uL — AB (ref 3.70–5.45)
RDW: 19 % — AB (ref 11.2–14.5)
WBC: 3.4 10*3/uL — ABNORMAL LOW (ref 3.9–10.3)

## 2017-08-03 MED ORDER — FULVESTRANT 250 MG/5ML IM SOLN
500.0000 mg | Freq: Once | INTRAMUSCULAR | Status: AC
  Administered 2017-08-03: 500 mg via INTRAMUSCULAR
  Filled 2017-08-03: qty 10

## 2017-08-03 MED ORDER — LISINOPRIL 20 MG PO TABS: 20.0000 mg | ORAL_TABLET | Freq: Every day | ORAL | 0 refills | Status: DC

## 2017-08-03 NOTE — Patient Instructions (Signed)

## 2017-08-03 NOTE — Telephone Encounter (Signed)
Rx done. 

## 2017-08-03 NOTE — Progress Notes (Signed)
ID: [name]   DOB: 11/20/1929  MR#: [id number]  WUJ#:[id number]  PCP: Laurey Morale, MD GYN:  SU: Rolm Bookbinder OTHER MD: Franchot Gallo, Kyung Rudd, Lucio Edward, [facility]  CHIEF COMPLAINT: bilateral breast cancer, status post bilateral mastectomies  CURRENT THERAPY: Fulvestrant and Palbociclib.   BREAST CANCER HISTORY: According to Dr. Julien Girt 02/23/2011 note:  She presented with a palpable mass in her left breast.  She was referred for a mammogram on 02/14/2011.  At that time diagnostic mammogram and ultrasound showed scattered fibroglandular densities in the lateral aspect of the left breast there was an irregular spiculated mass measuring 3.5 x 4.1 x 3.8 cm.  The physical exam at that time showed a hard palpable mass at 3 o'clock position in the left breast with some skin retraction.  Ultrasound was performed, which showed a hypoechoic mass with shadowing 10 cm from the nipple measuring 2.2 x 2.0 mm.  In the right breast there was two hypoechoic masses at 12 o'clock position 3 cm from the nipple measuring 5 x 5 mm each.  Biopsies recommended.  Biopsy on the suspicious lymph node was done at the same time is the primary mass on 02/14/2011.  Pathology showed this to be invasive mammary carcinoma.  The left axillary mass has insufficient tissue for diagnosis, the tumor was ER and PR positive, 100% strong staining intensity, proliferative index low at 36%.  The HER-2 ratio was 1.19.  The patient underwent a MRI scan of both breasts on 02/21/2011.  In the tail of the left breast there was a mass measuring 2.7 x 2.6 x 2.9 cm medial margin of both pectoralis muscle.  In the right breast there was clumped nodular enhancement at 12 o'clock position.  This area measured 1.8 cm.  Sentinel left axillary lymph node was also noted, which appeared to be suspicious. Her subsequent history is as detailed below.  INTERVAL HISTORY: Zaeda returns today for follow-up and treatment of her stage IV  estrogen receptor positive breast cancer. She continues on fulvestrant, with a dose due today. She tolerates the injections well and has no other side effects from the treatment than the mild discomfort of administration.  She is also on palbociclib. She is not quite clear whether she is on the current cycle, she thinks she is somewhere in the second week, but at any rate when she finishes the 21 pills that she gets every month she weighed 7 days before restarting so they seven-day gap is pretty automatic.  She is tolerating the palbociclib also with no fatigue, nausea, or other side effects that she is aware of. Her counts are holding up quite well.  REVIEW OF SYSTEMS: Thandiwe d had an upper respiratory infection which was taken care of by her primary care physician Dr. Sarajane Jews. She is still on Levaquin. Those symptoms are already improving. Aside from that she is not exercising regularly. She tells me what she likes to do is swim. She remains very active socially. She has significant arthralgias and myalgias which are unrelated to her cancer and unchanged from baseline. A detailed review of systems today was otherwise benign  PAST MEDICAL HISTORY: Past Medical History:  Diagnosis Date  . Adenomatous colon polyp 11/2005  . ALLERGIC RHINITIS 04/18/2007  . Anemia   . Arthritis   . Breast cancer [facility]) 2012    Dr. [name], ER/PR positive L breast  . Bronchitis    taking levaquin now for it.  Still runs a low grade fever at  times  . BURSITIS 01/04/2008  . COSTOCHONDRITIS 11/05/2007  . GERD 04/18/2007  . Hiatal hernia   . History of gallstones   . History of pneumonia   . Hx of radiation therapy 10/26/11 to 12/16/11   L chest wall, L supraclavicular region  . HYPERTENSION 09/30/2009  . Hypothyroidism    YRS AGO.....THINGS ARE FINE NOW  . Internal hemorrhoids   . LOW BACK PAIN SYNDROME 07/13/2009  . Melanoma ([place])    removed from upper back  . Pneumonia   . ROTATOR CUFF SYNDROME 05/06/2010    PAST  SURGICAL HISTORY: Past Surgical History:  Procedure Laterality Date  . ANKLE SURGERY     right  . BREAST SURGERY  1968   left bx - noncancerous  . CATARACT EXTRACTION     bilateral  . CHOLECYSTECTOMY  1979  . excision of back melanoma    . JOINT REPLACEMENT     left knee  . MASTECTOMY  08/25/11   bilateral , per Dr. Donne Hazel  . SENTINEL LYMPH NODE BIOPSY  08/25/11   bilateral  . TONSILLECTOMY AND ADENOIDECTOMY    . TOTAL KNEE ARTHROPLASTY Right 03/27/2015   Procedure: TOTAL KNEE ARTHROPLASTY;  Surgeon: [name], MD;  Location: [place];  Service: Orthopedics;  Laterality: Right;    FAMILY HISTORY Family History  Problem Relation Age of Onset  . Stroke Father   . Alcohol abuse Sister   . Cirrhosis Sister   . Thyroid cancer Mother   . Retinal detachment Mother   . Diabetes Paternal Uncle   . Asthma Maternal Grandmother    the patient's father died at the age of 46 following a stroke. He had an independent in driving up to one month prior to dying. [name] died from thyroid cancer at the age of 24. This had been diagnosed less than a year before her death. The patient had one brother and one sister. There is no history of breast or ovarian cancer in the family to her knowledge.  GYNECOLOGIC HISTORY: Menarche age 25, first live birth age 82, she is [place] P2. She underwent menopause around 74. She did not take hormone replacement.  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 She is a widow and lives by herself. Son Casey Burkitt Horace the third lives in [place] and works for the [employer]. Daughter [name] lives in [place] and works with a [employer]. The patient has 3 grandchildren. She attends a [employer].   ADVANCED DIRECTIVES: In place  HEALTH MAINTENANCE: Social History  Substance Use Topics  . Smoking status: Passive Smoke Exposure - Never Smoker  . Smokeless tobacco: Never Used      Comment: as a child.   . Alcohol use 0.6 oz/week    1 Standard drinks or equivalent per week     Comment: socially     Colonoscopy:  PAP:  Bone density:  Lipid panel:  Allergies  Allergen Reactions  . Cefaclor Hives and Itching    Purple splotches  . Lansoprazole Hives and Itching    Purple splotches, hurting  . Penicillins Hives and Itching    Purple splotches  . Sulfa Drugs Cross Reactors Other (See Comments)    Purple whelps, itch, "just go crazy"    Current Outpatient Prescriptions  Medication Sig Dispense Refill  . albuterol (PROVENTIL HFA;VENTOLIN HFA) 108 (90 BASE) MCG/ACT inhaler Inhale 2 puffs into the lungs every 4 (four) hours as needed for wheezing  or shortness of breath. 1 Inhaler 2  . ALPRAZolam (XANAX) 0.25 MG tablet Take 1 tablet (0.25 mg total) by mouth as directed. 2 tablet 0  . fluticasone (FLONASE) 50 MCG/ACT nasal spray Place 1 spray into both nostrils 2 (two) times daily. 16 g 6  . guaifenesin (ROBITUSSIN) 100 MG/5ML syrup Take 100 mg by mouth at bedtime.    Marland Kitchen ibuprofen (ADVIL,MOTRIN) 200 MG tablet Take 200 mg by mouth every 6 (six) hours as needed.    Marland Kitchen levofloxacin (LEVAQUIN) 500 MG tablet Take 1 tablet (500 mg total) by mouth daily. 10 tablet 0  . lisinopril (PRINIVIL,ZESTRIL) 20 MG tablet TAKE 1 TABLET ONCE DAILY. 90 tablet 1  . omeprazole (PRILOSEC) 40 MG capsule TAKE (1) CAPSULE TWICE DAILY. 60 capsule 5  . palbociclib (IBRANCE) 100 MG capsule Take 1 capsule (100 mg total) by mouth daily with breakfast. Take for 21 days on, 7 days off. 21 capsule 6  . Polyethyl Glycol-Propyl Glycol (SYSTANE) 0.4-0.3 % SOLN Place 1 drop into both eyes 2 (two) times daily as needed (dry eyes).     . prochlorperazine (COMPAZINE) 5 MG tablet Take 1 tablet (5 mg total) by mouth every 6 (six) hours as needed for nausea or vomiting. (Patient not taking: Reported on 07/06/2017) 30 tablet 0   No current facility-administered medications for this visit.     OBJECTIVE: Older  white woman who appears stated age  83:   08/03/17 1011  BP: (!) 141/66  Pulse: 72  Resp: 16  Temp: 98.7 F (37.1 C)  SpO2: 98%     Body mass index is 30.42 kg/m.    ECOG FS: 1  Sclerae unicteric, EOMs intact Oropharynx clear and moist No cervical or supraclavicular adenopathy Lungs no rales or rhonchi Heart regular rate and rhythm Abd soft, nontender, positive bowel sounds MSK no focal spinal tenderness, no upper extremity lymphedema, hands show significant degenerative changes Neuro: nonfocal, well oriented, appropriate affect Breasts: Status post bilateral mastectomies. The left side also had radiation resulting in telangiectasias. There is no evidence of chest wall recurrence. Both axillae are benign.   LAB RESULTS: Lab Results  Component Value Date   WBC 3.4 (L) 08/03/2017   NEUTROABS 2.1 08/03/2017   HGB 10.7 (L) 08/03/2017   HCT 32.3 (L) 08/03/2017   MCV 90.7 08/03/2017   PLT 214 08/03/2017      Chemistry      Component Value Date/Time   NA 141 07/06/2017 1241   K 4.1 07/06/2017 1241   CL 104 04/11/2017 1152   CL 108 (H) 12/25/2012 1235   CO2 27 07/06/2017 1241   BUN 22.7 07/06/2017 1241   CREATININE 1.1 07/06/2017 1241      Component Value Date/Time   CALCIUM 9.8 07/06/2017 1241   ALKPHOS 63 07/06/2017 1241   AST 11 07/06/2017 1241   ALT 9 07/06/2017 1241   BILITOT 0.28 07/06/2017 1241       Lab Results  Component Value Date   LABCA2 12 12/25/2012    No components found for: [id number]  No results for input(s): INR in the last 168 hours.  Urinalysis    Component Value Date/Time   COLORURINE YELLOW 03/13/2015 1042   APPEARANCEUR CLEAR 03/13/2015 1042   LABSPEC 1.007 03/13/2015 1042   PHURINE 6.5 03/13/2015 1042   GLUCOSEU NEGATIVE 03/13/2015 1042   HGBUR NEGATIVE 03/13/2015 1042   HGBUR negative 05/24/2010 1555   BILIRUBINUR neg 09/08/2015 1557   KETONESUR NEGATIVE 03/13/2015 1042   PROTEINUR  neg 09/08/2015 1557   PROTEINUR  NEGATIVE 03/13/2015 1042   UROBILINOGEN 0.2 09/08/2015 1557   UROBILINOGEN 0.2 03/13/2015 1042   NITRITE neg 09/08/2015 1557   NITRITE NEGATIVE 03/13/2015 1042   LEUKOCYTESUR Negative 09/08/2015 1557    STUDIES: No results found.   ASSESSMENT: 83 y.o. 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  (2) status post bilateral mastectomies 08/25/2011, showing  (a) on the right, ductal carcinoma in situ, low-grade, estrogen receptor 100% and progesterone receptor 80% positive, with ample margins  (b) on the left, a pT2 pN1, stage IIB invasive ductal carcinoma, grade 1, 100% estrogen and 100% progesterone receptor positive, with an MIB-1 of 36%, and no HER-2 amplification.  (3) left postmastectomy radiation completed 12/27/2011  (4) took aromatase inhibitors (letrozole and anastrozole) prior to her bilateral mastectomies, with poor tolerance. Took tamoxifen briefly after completing radiation, and again with poor tolerance. 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  (a) PET SCAN: Left upper lobe mass hypermetabolic with SUV max 145.6 Hypermetabolic subcarinal and contralateral right lower paratracheal metabolic adenopathy SUV 8.4, hypermetabolic right hilar lymph nodes SUV max 5.4, small hypermetabolic  rigth supraclavicular lymph nodes, small but intense activity for size SUV max 4, Hypermetabolic activity along medial border of left pectoralis muscle SUV 5.8. Mildly anterior margin of the right kidney 3.6cm mass concerning for renal cell carcinoma.    (b) 04/11/2017: core biopsy of left subpectoral mass: invasive mammary carcinoma, ER+(95%), PR+(95%), Ki 67 2%, HER-2 negative (ratio 1.25).   (c( CA-27-29 is noninformative  (2) Fulvestrant started on 05/08/2017 with Palbociclib added on 7/9/2018at 100 mg/d      PLAN:  LDesiraeis now about 7 months out from definitive diagnosis of stage IV breast cancer, and clinically she is doing very well. She has no symptoms related to the disease.  Furthermore she is tolerating treatment remarkably well. Neither the palbociclib nor the fulvestrant is causing her any side effects that she is aware of.  The plan accordingly is to continue. She will have a restaging CT scan of the chest late October. She will see [place] in November 1--that will be a shared visit with my APP since I did not have any availability that morning. Assuming at least disease control, the plan will be to continue the treatment until there is evidence of disease progression or poor tolerance.  She knows to call for any problems that may develop before her next visit here.   Stepfon Rawles C    08/03/2017

## 2017-08-16 MED FILL — IBRANCE 100 MG CAPSULE: 100 | 21 days supply | Qty: 21 | Fill #3

## 2017-08-18 ENCOUNTER — Encounter: Payer: Self-pay | Admitting: Family Medicine

## 2017-08-18 ENCOUNTER — Ambulatory Visit (INDEPENDENT_AMBULATORY_CARE_PROVIDER_SITE_OTHER): Payer: Medicare Other | Admitting: Family Medicine

## 2017-08-18 VITALS — BP 109/59 | HR 75 | Temp 98.8°F | Ht 62.0 in | Wt 163.0 lb

## 2017-08-18 DIAGNOSIS — J209 Acute bronchitis, unspecified: Secondary | ICD-10-CM

## 2017-08-18 MED ORDER — LEVOFLOXACIN 500 MG PO TABS: 500.0000 mg | ORAL_TABLET | Freq: Every day | ORAL | 0 refills | Status: AC

## 2017-08-18 MED ORDER — HYDROCODONE-HOMATROPINE 5-1.5 MG/5ML PO SYRP: 5.0000 mL | ORAL_SOLUTION | ORAL | 0 refills | Status: DC | PRN

## 2017-08-18 NOTE — Progress Notes (Signed)
   Subjective:    Patient ID: April [name], female    DOB: 16-Jul-1929, 83 y.o.   MRN: [id number]  HPI Here for 5 days of chest congestion and a dry cough. No fever.    Review of Systems  Constitutional: Negative.   HENT: Negative.   Eyes: Negative.   Respiratory: Positive for cough and wheezing.   Cardiovascular: Negative.        Objective:   Physical Exam  Constitutional: She appears well-developed and well-nourished.  HENT:  Right Ear: External ear normal.  Left Ear: External ear normal.  Nose: Nose normal.  Mouth/Throat: Oropharynx is clear and moist.  Eyes: Conjunctivae are normal.  Neck: Neck supple. No thyromegaly present.  Cardiovascular: Normal rate, regular rhythm, normal heart sounds and intact distal pulses.   Pulmonary/Chest: Effort normal. No respiratory distress. She has no rales.  Scattered wheezes and rhonchi   Lymphadenopathy:    She has no cervical adenopathy.          Assessment & Plan:  Bronchitis, treat with Levaquin. Use inhaler prn.  Alysia Penna, MD

## 2017-08-18 NOTE — Patient Instructions (Signed)
WE NOW OFFER   April [name]'s FAST TRACK!!!  SAME DAY Appointments for ACUTE CARE  Such as: Sprains, Injuries, cuts, abrasions, rashes, muscle pain, joint pain, back pain Colds, flu, sore throats, headache, allergies, cough, fever  Ear pain, sinus and eye infections Abdominal pain, nausea, vomiting, diarrhea, upset stomach Animal/insect bites  3 Easy Ways to Schedule: Walk-In Scheduling Call in scheduling Mychart Sign-up: [email or web address]         

## 2017-08-31 ENCOUNTER — Ambulatory Visit: Payer: Medicare Other

## 2017-08-31 ENCOUNTER — Other Ambulatory Visit: Payer: Medicare Other

## 2017-09-01 ENCOUNTER — Telehealth: Payer: Self-pay | Admitting: Oncology

## 2017-09-01 ENCOUNTER — Other Ambulatory Visit: Payer: Medicare Other

## 2017-09-01 ENCOUNTER — Ambulatory Visit: Payer: Medicare Other

## 2017-09-01 NOTE — Telephone Encounter (Signed)
Patient is sick and cannot come in today. Spoke and rescheduled her appts.

## 2017-09-05 MED FILL — IBRANCE 100 MG CAPSULE: 100 | 21 days supply | Qty: 21 | Fill #4

## 2017-09-07 ENCOUNTER — Other Ambulatory Visit: Payer: Medicare Other

## 2017-09-07 ENCOUNTER — Telehealth: Payer: Self-pay | Admitting: Oncology

## 2017-09-07 ENCOUNTER — Ambulatory Visit: Payer: Medicare Other

## 2017-09-07 NOTE — Telephone Encounter (Signed)
Spoke with patient and rescheduled her appts per 10/11 sch msg.

## 2017-09-12 ENCOUNTER — Ambulatory Visit (HOSPITAL_BASED_OUTPATIENT_CLINIC_OR_DEPARTMENT_OTHER): Payer: Medicare Other

## 2017-09-12 ENCOUNTER — Other Ambulatory Visit (HOSPITAL_BASED_OUTPATIENT_CLINIC_OR_DEPARTMENT_OTHER): Payer: Medicare Other

## 2017-09-12 VITALS — BP 150/84 | HR 73 | Temp 97.8°F | Resp 20

## 2017-09-12 DIAGNOSIS — C50912 Malignant neoplasm of unspecified site of left female breast: Secondary | ICD-10-CM | POA: Diagnosis not present

## 2017-09-12 DIAGNOSIS — C7802 Secondary malignant neoplasm of left lung: Secondary | ICD-10-CM

## 2017-09-12 DIAGNOSIS — Z170 Estrogen receptor positive status [ER+]: Principal | ICD-10-CM

## 2017-09-12 DIAGNOSIS — Z5111 Encounter for antineoplastic chemotherapy: Secondary | ICD-10-CM

## 2017-09-12 DIAGNOSIS — C50012 Malignant neoplasm of nipple and areola, left female breast: Secondary | ICD-10-CM

## 2017-09-12 LAB — COMPREHENSIVE METABOLIC PANEL
ALBUMIN: 3.5 g/dL (ref 3.5–5.0)
ALK PHOS: 59 U/L (ref 40–150)
ALT: 10 U/L (ref 0–55)
AST: 12 U/L (ref 5–34)
Anion Gap: 6 mEq/L (ref 3–11)
BUN: 17.8 mg/dL (ref 7.0–26.0)
CALCIUM: 9.3 mg/dL (ref 8.4–10.4)
CHLORIDE: 107 meq/L (ref 98–109)
CO2: 27 mEq/L (ref 22–29)
Creatinine: 1 mg/dL (ref 0.6–1.1)
EGFR: 49 mL/min/{1.73_m2} — AB (ref >60)
Glucose: 89 mg/dl (ref 70–140)
POTASSIUM: 4.3 meq/L (ref 3.5–5.1)
Sodium: 140 mEq/L (ref 136–145)
Total Bilirubin: 0.28 mg/dL (ref 0.20–1.20)
Total Protein: 6.6 g/dL (ref 6.4–8.3)

## 2017-09-12 LAB — CBC WITH DIFFERENTIAL/PLATELET
BASO%: 1.7 % (ref 0.0–2.0)
BASOS ABS: 0.1 10*3/uL (ref 0.0–0.1)
EOS ABS: 0.1 10*3/uL (ref 0.0–0.5)
EOS%: 1.4 % (ref 0.0–7.0)
HEMATOCRIT: 35.1 % (ref 34.8–46.6)
HEMOGLOBIN: 11.6 g/dL (ref 11.6–15.9)
LYMPH%: 26.3 % (ref 14.0–49.7)
MCH: 31.7 pg (ref 25.1–34.0)
MCHC: 33 g/dL (ref 31.5–36.0)
MCV: 95.9 fL (ref 79.5–101.0)
MONO#: 0.3 10*3/uL (ref 0.1–0.9)
MONO%: 6.9 % (ref 0.0–14.0)
NEUT#: 2.7 10*3/uL (ref 1.5–6.5)
NEUT%: 63.7 % (ref 38.4–76.8)
Platelets: 218 10*3/uL (ref 145–400)
RBC: 3.66 10*6/uL — ABNORMAL LOW (ref 3.70–5.45)
RDW: 18.5 % — ABNORMAL HIGH (ref 11.2–14.5)
WBC: 4.2 10*3/uL (ref 3.9–10.3)
lymph#: 1.1 10*3/uL (ref 0.9–3.3)

## 2017-09-12 MED ORDER — FULVESTRANT 250 MG/5ML IM SOLN
500.0000 mg | Freq: Once | INTRAMUSCULAR | Status: AC
  Administered 2017-09-12: 500 mg via INTRAMUSCULAR
  Filled 2017-09-12: qty 10

## 2017-09-26 ENCOUNTER — Ambulatory Visit (HOSPITAL_COMMUNITY)
Admission: RE | Admit: 2017-09-26 | Discharge: 2017-09-26 | Disposition: A | Discharge status: Home / Self Care | Payer: Medicare Other | Source: Ambulatory Visit | Attending: Oncology | Admitting: Oncology

## 2017-09-26 ENCOUNTER — Encounter (HOSPITAL_COMMUNITY): Payer: Self-pay

## 2017-09-26 DIAGNOSIS — C50812 Malignant neoplasm of overlapping sites of left female breast: Secondary | ICD-10-CM | POA: Diagnosis not present

## 2017-09-26 DIAGNOSIS — Z170 Estrogen receptor positive status [ER+]: Secondary | ICD-10-CM | POA: Diagnosis present

## 2017-09-26 DIAGNOSIS — C7800 Secondary malignant neoplasm of unspecified lung: Secondary | ICD-10-CM | POA: Diagnosis present

## 2017-09-26 DIAGNOSIS — J9819 Other pulmonary collapse: Secondary | ICD-10-CM | POA: Insufficient documentation

## 2017-09-26 DIAGNOSIS — R911 Solitary pulmonary nodule: Secondary | ICD-10-CM | POA: Diagnosis not present

## 2017-09-26 DIAGNOSIS — D4101 Neoplasm of uncertain behavior of right kidney: Secondary | ICD-10-CM | POA: Insufficient documentation

## 2017-09-26 DIAGNOSIS — C7951 Secondary malignant neoplasm of bone: Secondary | ICD-10-CM | POA: Diagnosis not present

## 2017-09-26 DIAGNOSIS — R590 Localized enlarged lymph nodes: Secondary | ICD-10-CM | POA: Diagnosis not present

## 2017-09-26 MED ORDER — IOPAMIDOL (ISOVUE-300) INJECTION 61%
INTRAVENOUS | Status: AC
  Administered 2017-09-26: 75 mL via INTRAVENOUS
  Filled 2017-09-26: qty 75

## 2017-09-26 MED ORDER — IOPAMIDOL (ISOVUE-300) INJECTION 61%
75.0000 mL | Freq: Once | INTRAVENOUS | Status: AC | PRN
  Administered 2017-09-26: 75 mL via INTRAVENOUS

## 2017-09-27 MED FILL — IBRANCE 100 MG CAPSULE: 100 | 21 days supply | Qty: 21 | Fill #5

## 2017-09-28 ENCOUNTER — Other Ambulatory Visit: Payer: Medicare Other

## 2017-09-28 ENCOUNTER — Ambulatory Visit: Payer: Medicare Other

## 2017-09-28 ENCOUNTER — Ambulatory Visit: Payer: Medicare Other | Admitting: Adult Health

## 2017-10-06 ENCOUNTER — Telehealth: Payer: Self-pay | Admitting: Adult Health

## 2017-10-06 ENCOUNTER — Encounter: Payer: Self-pay | Admitting: Adult Health

## 2017-10-06 ENCOUNTER — Ambulatory Visit (HOSPITAL_BASED_OUTPATIENT_CLINIC_OR_DEPARTMENT_OTHER): Payer: Medicare Other

## 2017-10-06 ENCOUNTER — Other Ambulatory Visit (HOSPITAL_BASED_OUTPATIENT_CLINIC_OR_DEPARTMENT_OTHER): Payer: Medicare Other

## 2017-10-06 ENCOUNTER — Ambulatory Visit (HOSPITAL_BASED_OUTPATIENT_CLINIC_OR_DEPARTMENT_OTHER): Payer: Medicare Other | Admitting: Adult Health

## 2017-10-06 VITALS — BP 151/64 | HR 90 | Temp 98.2°F | Resp 18 | Ht 62.0 in | Wt 165.0 lb

## 2017-10-06 DIAGNOSIS — R21 Rash and other nonspecific skin eruption: Secondary | ICD-10-CM | POA: Diagnosis not present

## 2017-10-06 DIAGNOSIS — Z170 Estrogen receptor positive status [ER+]: Principal | ICD-10-CM

## 2017-10-06 DIAGNOSIS — C50012 Malignant neoplasm of nipple and areola, left female breast: Secondary | ICD-10-CM

## 2017-10-06 DIAGNOSIS — C50912 Malignant neoplasm of unspecified site of left female breast: Secondary | ICD-10-CM

## 2017-10-06 DIAGNOSIS — C7802 Secondary malignant neoplasm of left lung: Secondary | ICD-10-CM | POA: Diagnosis not present

## 2017-10-06 DIAGNOSIS — R110 Nausea: Secondary | ICD-10-CM

## 2017-10-06 DIAGNOSIS — C50812 Malignant neoplasm of overlapping sites of left female breast: Secondary | ICD-10-CM

## 2017-10-06 DIAGNOSIS — Z5111 Encounter for antineoplastic chemotherapy: Secondary | ICD-10-CM | POA: Diagnosis not present

## 2017-10-06 LAB — COMPREHENSIVE METABOLIC PANEL
ALBUMIN: 3.7 g/dL (ref 3.5–5.0)
ALT: 13 U/L (ref 0–55)
AST: 12 U/L (ref 5–34)
Alkaline Phosphatase: 59 U/L (ref 40–150)
Anion Gap: 7 mEq/L (ref 3–11)
BUN: 22 mg/dL (ref 7.0–26.0)
CO2: 25 meq/L (ref 22–29)
CREATININE: 1 mg/dL (ref 0.6–1.1)
Calcium: 9.3 mg/dL (ref 8.4–10.4)
Chloride: 108 mEq/L (ref 98–109)
EGFR: 49 mL/min/{1.73_m2} — ABNORMAL LOW (ref >60)
GLUCOSE: 100 mg/dL (ref 70–140)
Potassium: 3.7 mEq/L (ref 3.5–5.1)
SODIUM: 140 meq/L (ref 136–145)
TOTAL PROTEIN: 6.8 g/dL (ref 6.4–8.3)
Total Bilirubin: 0.31 mg/dL (ref 0.20–1.20)

## 2017-10-06 LAB — CBC WITH DIFFERENTIAL/PLATELET
BASO%: 2.2 % — ABNORMAL HIGH (ref 0.0–2.0)
BASOS ABS: 0.1 10*3/uL (ref 0.0–0.1)
EOS%: 1.2 % (ref 0.0–7.0)
Eosinophils Absolute: 0.1 10*3/uL (ref 0.0–0.5)
HEMATOCRIT: 37.1 % (ref 34.8–46.6)
HGB: 12.3 g/dL (ref 11.6–15.9)
LYMPH%: 28.5 % (ref 14.0–49.7)
MCH: 32.3 pg (ref 25.1–34.0)
MCHC: 33.2 g/dL (ref 31.5–36.0)
MCV: 97.4 fL (ref 79.5–101.0)
MONO#: 0.2 10*3/uL (ref 0.1–0.9)
MONO%: 5.1 % (ref 0.0–14.0)
NEUT#: 2.6 10*3/uL (ref 1.5–6.5)
NEUT%: 63 % (ref 38.4–76.8)
Platelets: 292 10*3/uL (ref 145–400)
RBC: 3.81 10*6/uL (ref 3.70–5.45)
RDW: 16.7 % — ABNORMAL HIGH (ref 11.2–14.5)
WBC: 4.1 10*3/uL (ref 3.9–10.3)
lymph#: 1.2 10*3/uL (ref 0.9–3.3)

## 2017-10-06 MED ORDER — FULVESTRANT 250 MG/5ML IM SOLN
500.0000 mg | Freq: Once | INTRAMUSCULAR | Status: AC
  Administered 2017-10-06: 500 mg via INTRAMUSCULAR
  Filled 2017-10-06: qty 10

## 2017-10-06 MED ORDER — CLOTRIMAZOLE-BETAMETHASONE 1-0.05 % EX CREA: 1.0000 "application " | TOPICAL_CREAM | Freq: Two times a day (BID) | CUTANEOUS | 0 refills | Status: DC

## 2017-10-06 MED ORDER — PROCHLORPERAZINE MALEATE 5 MG PO TABS: 5.0000 mg | ORAL_TABLET | Freq: Four times a day (QID) | ORAL | 0 refills | Status: DC | PRN

## 2017-10-06 NOTE — Progress Notes (Signed)
ID: April [name]   DOB: 13-Dec-1928  MR#: [id number]  WOE#:[id number]  PCP: Laurey Morale, MD GYN:  SU: Rolm Bookbinder OTHER MD: Franchot Gallo, Kyung Rudd, Lucio Edward, [facility]  CHIEF COMPLAINT: bilateral breast cancer, status post bilateral mastectomies  CURRENT THERAPY: Fulvestrant and Palbociclib.   BREAST CANCER HISTORY: According to Dr. Julien Girt 02/23/2011 note:  She presented with a palpable mass in her left breast.  She was referred for a mammogram on 02/14/2011.  At that time diagnostic mammogram and ultrasound showed scattered fibroglandular densities in the lateral aspect of the left breast there was an irregular spiculated mass measuring 3.5 x 4.1 x 3.8 cm.  The physical exam at that time showed a hard palpable mass at 3 o'clock position in the left breast with some skin retraction.  Ultrasound was performed, which showed a hypoechoic mass with shadowing 10 cm from the nipple measuring 2.2 x 2.0 mm.  In the right breast there was two hypoechoic masses at 12 o'clock position 3 cm from the nipple measuring 5 x 5 mm each.  Biopsies recommended.  Biopsy on the suspicious lymph node was done at the same time is the primary mass on 02/14/2011.  Pathology showed this to be invasive mammary carcinoma.  The left axillary mass has insufficient tissue for diagnosis, the tumor was ER and PR positive, 100% strong staining intensity, proliferative index low at 36%.  The HER-2 ratio was 1.19.  The patient underwent a MRI scan of both breasts on 02/21/2011.  In the tail of the left breast there was a mass measuring 2.7 x 2.6 x 2.9 cm medial margin of both pectoralis muscle.  In the right breast there was clumped nodular enhancement at 12 o'clock position.  This area measured 1.8 cm.  Sentinel left axillary lymph node was also noted, which appeared to be suspicious. Her subsequent history is as detailed below.  INTERVAL HISTORY: April [name] returns today for follow-up and treatment of her stage IV  estrogen receptor positive breast cancer. She continues on fulvestrant, with a dose due today. She tolerates this well.  She also takes Palbociclib 3 weeks on one week off and tolerates this as well.      REVIEW OF SYSTEMS: April [name] has a rash under her breasts that itches.  She is concerned it is possibly related to the Palbociclib and is seeing dermatology about it on Monday.  She is slightly nauseated and this happens frequently.  [employer] makes it better.  Otherwise, a detailed ROS was conducted and is non contributory.    PAST MEDICAL HISTORY: Past Medical History:  Diagnosis Date  . Adenomatous colon polyp 11/2005  . ALLERGIC RHINITIS 04/18/2007  . Anemia   . Arthritis   . Breast cancer [facility]) 2012    Dr. [name], ER/PR positive L breast  . Bronchitis    taking levaquin now for it.  Still runs a low grade fever at times  . BURSITIS 01/04/2008  . COSTOCHONDRITIS 11/05/2007  . GERD 04/18/2007  . Hiatal hernia   . History of gallstones   . History of pneumonia   . Hx of radiation therapy 10/26/11 to 12/16/11   L chest wall, L supraclavicular region  . HYPERTENSION 09/30/2009  . Hypothyroidism    YRS AGO.....THINGS ARE FINE NOW  . Internal hemorrhoids   . LOW BACK PAIN SYNDROME 07/13/2009  . Melanoma ([place])    removed from upper back  . Pneumonia   . ROTATOR CUFF SYNDROME 05/06/2010    PAST  SURGICAL HISTORY: Past Surgical History:  Procedure Laterality Date  . ANKLE SURGERY     right  . BREAST SURGERY  1968   left bx - noncancerous  . CATARACT EXTRACTION     bilateral  . CHOLECYSTECTOMY  1979  . excision of back melanoma    . JOINT REPLACEMENT     left knee  . MASTECTOMY  08/25/11   bilateral , per Dr. Donne Hazel  . SENTINEL LYMPH NODE BIOPSY  08/25/11   bilateral  . TONSILLECTOMY AND ADENOIDECTOMY      FAMILY HISTORY Family History  Problem Relation Age of Onset  . Stroke Father   . Alcohol abuse Sister   . Cirrhosis Sister   . Thyroid cancer Mother   . Retinal  detachment Mother   . Diabetes Paternal Uncle   . Asthma Maternal Grandmother    the patient's father died at the age of 65 following a stroke. He had an independent in driving up to one month prior to dying. [name] died from thyroid cancer at the age of 59. This had been diagnosed less than a year before her death. The patient had one brother and one sister. There is no history of breast or ovarian cancer in the family to her knowledge.  GYNECOLOGIC HISTORY: Menarche age 52, first live birth age 19, she is [place] P2. She underwent menopause around 89. She did not take hormone replacement.  SOCIAL HISTORY: [place] has always worked as a housewife. She is a widow and lives by herself. Son Casey Burkitt [place] the third lives in [place] and works for the [employer]. Daughter [name] lives in [place] and works with a [employer]. The patient has 3 grandchildren. She attends a [employer].   ADVANCED DIRECTIVES: In place  HEALTH MAINTENANCE: Social History   Tobacco Use  . Smoking status: Passive Smoke Exposure - Never Smoker  . Smokeless tobacco: Never Used  . Tobacco comment: as a child.   Substance Use Topics  . Alcohol use: Yes    Alcohol/week: 0.6 oz    Types: 1 Standard drinks or equivalent per week    Comment: socially  . Drug use: No     Colonoscopy:  PAP:  Bone density:  Lipid panel:  Allergies  Allergen Reactions  . Cefaclor Hives and Itching    Purple splotches  . Lansoprazole Hives and Itching    Purple splotches, hurting  . Penicillins Hives and Itching    Purple splotches  . Sulfa Drugs Cross Reactors Other (See Comments)    Purple whelps, itch, "just go crazy"    Current Outpatient Medications  Medication Sig Dispense Refill  . albuterol (PROVENTIL HFA;VENTOLIN HFA) 108 (90 BASE) MCG/ACT inhaler Inhale 2 puffs into the lungs every 4 (four) hours as needed for wheezing or shortness of breath. 1  Inhaler 2  . ALPRAZolam (XANAX) 0.25 MG tablet Take 1 tablet (0.25 mg total) by mouth as directed. 2 tablet 0  . fluticasone (FLONASE) 50 MCG/ACT nasal spray Place 1 spray into both nostrils 2 (two) times daily. 16 g 6  . guaifenesin (ROBITUSSIN) 100 MG/5ML syrup Take 100 mg by mouth at bedtime.    Marland Kitchen HYDROcodone-homatropine (HYDROMET) 5-1.5 MG/5ML syrup Take 5 mLs by mouth every 4 (four) hours as needed. 240 mL 0  . ibuprofen (ADVIL,MOTRIN) 200 MG tablet Take 200 mg by mouth every 6 (six) hours as needed.    Marland Kitchen lisinopril (PRINIVIL,ZESTRIL) 20 MG tablet Take 1  tablet (20 mg total) by mouth daily. 90 tablet 0  . omeprazole (PRILOSEC) 40 MG capsule TAKE (1) CAPSULE TWICE DAILY. 60 capsule 5  . palbociclib (IBRANCE) 100 MG capsule Take 1 capsule (100 mg total) by mouth daily with breakfast. Take for 21 days on, 7 days off. 21 capsule 6  . Polyethyl Glycol-Propyl Glycol (SYSTANE) 0.4-0.3 % SOLN Place 1 drop into both eyes 2 (two) times daily as needed (dry eyes).     . prochlorperazine (COMPAZINE) 5 MG tablet Take 1 tablet (5 mg total) every 6 (six) hours as needed by mouth for nausea or vomiting. 30 tablet 0  . clotrimazole-betamethasone (LOTRISONE) cream Apply 1 application 2 (two) times daily topically. 30 g 0   No current facility-administered medications for this visit.     OBJECTIVE:   Vitals:   10/06/17 1434  BP: (!) 151/64  Pulse: 90  Resp: 18  Temp: 98.2 F (36.8 C)  SpO2: 98%     Body mass index is 30.18 kg/m.    ECOG FS: 1 GENERAL: Patient is a well appearing female in no acute distress HEENT:  Sclerae anicteric.  Oropharynx clear and moist. No ulcerations or evidence of oropharyngeal candidiasis. Neck is supple.  NODES:  No cervical, supraclavicular, or axillary lymphadenopathy palpated.  BREAST EXAM:  S/p bilateral mastectomies.  Dry, erythematous rash horizontally across chest wall, appears fungal LUNGS:  Clear to auscultation bilaterally.  No wheezes or rhonchi. HEART:   Regular rate and rhythm. No murmur appreciated. ABDOMEN:  Soft, nontender.  Positive, normoactive bowel sounds. No organomegaly palpated. MSK:  No focal spinal tenderness to palpation. Full range of motion bilaterally in the upper extremities. EXTREMITIES:  No peripheral edema.   SKIN:  Clear with no obvious rashes or skin changes. No nail dyscrasia. NEURO:  Nonfocal. Well oriented.  Appropriate affect.    LAB RESULTS: Lab Results  Component Value Date   WBC 4.1 10/06/2017   NEUTROABS 2.6 10/06/2017   HGB 12.3 10/06/2017   HCT 37.1 10/06/2017   MCV 97.4 10/06/2017   PLT 292 10/06/2017      Chemistry      Component Value Date/Time   NA 140 10/06/2017 1415   K 3.7 10/06/2017 1415   CL 104 04/11/2017 1152   CL 108 (H) 12/25/2012 1235   CO2 25 10/06/2017 1415   BUN 22.0 10/06/2017 1415   CREATININE 1.0 10/06/2017 1415      Component Value Date/Time   CALCIUM 9.3 10/06/2017 1415   ALKPHOS 59 10/06/2017 1415   AST 12 10/06/2017 1415   ALT 13 10/06/2017 1415   BILITOT 0.31 10/06/2017 1415       Lab Results  Component Value Date   LABCA2 12 12/25/2012    No components found for: [id number]  No results for input(s): INR in the last 168 hours.  Urinalysis    Component Value Date/Time   COLORURINE YELLOW 03/13/2015 1042   APPEARANCEUR CLEAR 03/13/2015 1042   LABSPEC 1.007 03/13/2015 1042   PHURINE 6.5 03/13/2015 1042   GLUCOSEU NEGATIVE 03/13/2015 1042   HGBUR NEGATIVE 03/13/2015 1042   HGBUR negative 05/24/2010 1555   BILIRUBINUR neg 09/08/2015 1557   KETONESUR NEGATIVE 03/13/2015 1042   PROTEINUR neg 09/08/2015 1557   PROTEINUR NEGATIVE 03/13/2015 1042   UROBILINOGEN 0.2 09/08/2015 1557   UROBILINOGEN 0.2 03/13/2015 1042   NITRITE neg 09/08/2015 1557   NITRITE NEGATIVE 03/13/2015 1042   LEUKOCYTESUR Negative 09/08/2015 1557    STUDIES: Ct Chest W Contrast  Result Date: 09/26/2017 CLINICAL DATA:  Followup breast carcinoma diagnosed 2012 with bone and  lung metastasis. Chemotherapy ongoing. Radiation therapy complete. EXAM: CT CHEST WITH CONTRAST TECHNIQUE: Multidetector CT imaging of the chest was performed during intravenous contrast administration. CONTRAST:  100 cc Isovue COMPARISON:  PET-CT 03/20/2017 FINDINGS: Cardiovascular: No significant vascular findings. Normal heart size. No pericardial effusion. Mediastinum/Nodes: No axillary or supraclavicular adenopathy. RIGHT hilar lymph node and small paratracheal lymph nodes are similar in size to comparison PET-CT scan. Lungs/Pleura: Persistent atelectasis in the medial aspect of the LEFT upper lobe. Nodule within the RIGHT middle lobe measuring 6 mm is unchanged. No new pulmonary nodules. Upper Abdomen: Limited view of the liver demonstrates no hepatic metastasis. Enhancing lesion extends from lower pole of the RIGHT kidney measuring 3 cm consistent with renal neoplasm. No glands are normal. Musculoskeletal: Sclerotic lesion in the manubrium unchanged. No progression of nodule anterior to the LEFT humerus. IMPRESSION: 1. No evidence disease progression. 2. Stable mild mediastinal adenopathy and postobstructive collapse of the medial aspect of the LEFT upper lobe. 3. Stable sclerotic metastasis in the sternum. 4. Stable nodule in the RIGHT middle lobe. 5. RIGHT renal neoplasm again noted. Electronically Signed   By: Suzy Bouchard M.D.   On: 09/26/2017 16:02     ASSESSMENT: 83 y.o. 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  (2) status post bilateral mastectomies 08/25/2011, showing  (a) on the right, ductal carcinoma in situ, low-grade, estrogen receptor 100% and progesterone receptor 80% positive, with ample margins  (b) on the left, a pT2 pN1, stage IIB invasive ductal carcinoma, grade 1, 100% estrogen and 100% progesterone receptor positive, with an MIB-1 of 36%, and no HER-2  amplification.  (3) left postmastectomy radiation completed 12/27/2011  (4) took aromatase inhibitors (letrozole and anastrozole) prior to her bilateral mastectomies, with poor tolerance. Took tamoxifen briefly after completing radiation, and again with poor tolerance. 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  (a) PET SCAN: Left upper lobe mass hypermetabolic with SUV max 172.0 Hypermetabolic subcarinal and contralateral right lower paratracheal metabolic adenopathy SUV 8.4, hypermetabolic right hilar lymph nodes SUV max 5.4, small hypermetabolic rigth supraclavicular lymph nodes, small but intense activity for size SUV max 4, Hypermetabolic activity along medial border of left pectoralis muscle SUV 5.8. Mildly anterior margin of the right kidney 3.6cm mass concerning for renal cell carcinoma.    (b) 04/11/2017: core biopsy of left subpectoral mass: invasive mammary carcinoma, ER+(95%), PR+(95%), Ki 67 2%, HER-2 negative (ratio 1.25).   (c( CA-27-29 is noninformative  (2) Fulvestrant started on 05/08/2017 with Palbociclib added on 06/05/2017 at 100 mg/d      PLAN:  LJadeneis doing well.  She will continue taking Palbociclib 3 weeks on one week off.  She is tolerating it well and her labs are normal today.  She will also proceed with Fulvestrant  today as she is doing well with this.  I reviewed her recent CT chest from late  October which showed no evidence of progression.  She is happy to hear this.  In regards to the rash, I prescribed Lotrisone cream for her to apply.  She was recommended to let it completely absorb prior to putting on her bra with her breast prostheses.  She will return in 4 weeks for labs and injection and in 8 weeks for labs, f/u with Dr. Jana Hakim, and injection.    She knows to call for any problems that may develop before her next visit here.  A total of (30) minutes of face-to-face time was spent with this patient with greater than 50% of that time in counseling and care-coordination.    Scot Dock    10/06/2017

## 2017-10-06 NOTE — Telephone Encounter (Signed)
Gave patient AVS and calendar of upcoming December and January appointments.

## 2017-10-06 NOTE — Patient Instructions (Signed)

## 2017-10-09 DIAGNOSIS — L814 Other melanin hyperpigmentation: Secondary | ICD-10-CM | POA: Diagnosis not present

## 2017-10-09 DIAGNOSIS — Z23 Encounter for immunization: Secondary | ICD-10-CM | POA: Diagnosis not present

## 2017-10-09 DIAGNOSIS — L570 Actinic keratosis: Secondary | ICD-10-CM | POA: Diagnosis not present

## 2017-10-09 DIAGNOSIS — D1800 Hemangioma unspecified site: Secondary | ICD-10-CM | POA: Diagnosis not present

## 2017-10-09 DIAGNOSIS — D225 Melanocytic nevi of trunk: Secondary | ICD-10-CM | POA: Diagnosis not present

## 2017-10-09 DIAGNOSIS — Z85828 Personal history of other malignant neoplasm of skin: Secondary | ICD-10-CM | POA: Diagnosis not present

## 2017-10-09 DIAGNOSIS — Z85820 Personal history of malignant melanoma of skin: Secondary | ICD-10-CM | POA: Diagnosis not present

## 2017-10-09 DIAGNOSIS — L309 Dermatitis, unspecified: Secondary | ICD-10-CM | POA: Diagnosis not present

## 2017-10-09 DIAGNOSIS — L821 Other seborrheic keratosis: Secondary | ICD-10-CM | POA: Diagnosis not present

## 2017-10-18 MED FILL — IBRANCE 100 MG CAPSULE: 100 | 21 days supply | Qty: 21 | Fill #6

## 2017-11-03 ENCOUNTER — Other Ambulatory Visit (HOSPITAL_BASED_OUTPATIENT_CLINIC_OR_DEPARTMENT_OTHER): Payer: Medicare Other

## 2017-11-03 ENCOUNTER — Ambulatory Visit (HOSPITAL_BASED_OUTPATIENT_CLINIC_OR_DEPARTMENT_OTHER): Payer: Medicare Other

## 2017-11-03 VITALS — BP 159/70 | HR 65 | Temp 96.6°F | Resp 16

## 2017-11-03 DIAGNOSIS — C7802 Secondary malignant neoplasm of left lung: Secondary | ICD-10-CM | POA: Diagnosis not present

## 2017-11-03 DIAGNOSIS — Z170 Estrogen receptor positive status [ER+]: Principal | ICD-10-CM

## 2017-11-03 DIAGNOSIS — C50912 Malignant neoplasm of unspecified site of left female breast: Secondary | ICD-10-CM

## 2017-11-03 DIAGNOSIS — Z5111 Encounter for antineoplastic chemotherapy: Secondary | ICD-10-CM | POA: Diagnosis not present

## 2017-11-03 DIAGNOSIS — C50012 Malignant neoplasm of nipple and areola, left female breast: Secondary | ICD-10-CM

## 2017-11-03 LAB — COMPREHENSIVE METABOLIC PANEL
ALBUMIN: 3.8 g/dL (ref 3.5–5.0)
ALK PHOS: 55 U/L (ref 40–150)
ALT: 8 U/L (ref 0–55)
AST: 12 U/L (ref 5–34)
Anion Gap: 8 mEq/L (ref 3–11)
BILIRUBIN TOTAL: 0.28 mg/dL (ref 0.20–1.20)
BUN: 22.2 mg/dL (ref 7.0–26.0)
CO2: 25 meq/L (ref 22–29)
CREATININE: 1.1 mg/dL (ref 0.6–1.1)
Calcium: 9.5 mg/dL (ref 8.4–10.4)
Chloride: 106 mEq/L (ref 98–109)
EGFR: 46 mL/min/{1.73_m2} — ABNORMAL LOW (ref >60)
GLUCOSE: 82 mg/dL (ref 70–140)
Potassium: 4.4 mEq/L (ref 3.5–5.1)
SODIUM: 139 meq/L (ref 136–145)
TOTAL PROTEIN: 6.7 g/dL (ref 6.4–8.3)

## 2017-11-03 LAB — CBC WITH DIFFERENTIAL/PLATELET
BASO%: 2.2 % — AB (ref 0.0–2.0)
Basophils Absolute: 0.1 10*3/uL (ref 0.0–0.1)
EOS ABS: 0.1 10*3/uL (ref 0.0–0.5)
EOS%: 2 % (ref 0.0–7.0)
HCT: 35.6 % (ref 34.8–46.6)
HEMOGLOBIN: 11.9 g/dL (ref 11.6–15.9)
LYMPH%: 34.9 % (ref 14.0–49.7)
MCH: 32.7 pg (ref 25.1–34.0)
MCHC: 33.4 g/dL (ref 31.5–36.0)
MCV: 97.8 fL (ref 79.5–101.0)
MONO#: 0.2 10*3/uL (ref 0.1–0.9)
MONO%: 6.7 % (ref 0.0–14.0)
NEUT%: 54.2 % (ref 38.4–76.8)
NEUTROS ABS: 1.9 10*3/uL (ref 1.5–6.5)
PLATELETS: 306 10*3/uL (ref 145–400)
RBC: 3.64 10*6/uL — AB (ref 3.70–5.45)
RDW: 15.7 % — AB (ref 11.2–14.5)
WBC: 3.6 10*3/uL — AB (ref 3.9–10.3)
lymph#: 1.3 10*3/uL (ref 0.9–3.3)

## 2017-11-03 MED ORDER — FULVESTRANT 250 MG/5ML IM SOLN
500.0000 mg | Freq: Once | INTRAMUSCULAR | Status: AC
  Administered 2017-11-03: 500 mg via INTRAMUSCULAR

## 2017-12-01 ENCOUNTER — Encounter: Payer: Self-pay | Admitting: Adult Health

## 2017-12-01 ENCOUNTER — Other Ambulatory Visit (HOSPITAL_BASED_OUTPATIENT_CLINIC_OR_DEPARTMENT_OTHER): Payer: Medicare Other

## 2017-12-01 ENCOUNTER — Telehealth: Payer: Self-pay | Admitting: Adult Health

## 2017-12-01 ENCOUNTER — Ambulatory Visit (HOSPITAL_BASED_OUTPATIENT_CLINIC_OR_DEPARTMENT_OTHER): Payer: Medicare Other | Admitting: Adult Health

## 2017-12-01 ENCOUNTER — Ambulatory Visit: Payer: Medicare Other

## 2017-12-01 VITALS — BP 141/72 | HR 70 | Temp 98.2°F | Resp 18 | Ht 62.0 in | Wt 172.1 lb

## 2017-12-01 DIAGNOSIS — C7802 Secondary malignant neoplasm of left lung: Secondary | ICD-10-CM

## 2017-12-01 DIAGNOSIS — C50912 Malignant neoplasm of unspecified site of left female breast: Secondary | ICD-10-CM

## 2017-12-01 DIAGNOSIS — Z170 Estrogen receptor positive status [ER+]: Secondary | ICD-10-CM | POA: Diagnosis not present

## 2017-12-01 DIAGNOSIS — C50012 Malignant neoplasm of nipple and areola, left female breast: Secondary | ICD-10-CM

## 2017-12-01 DIAGNOSIS — C7800 Secondary malignant neoplasm of unspecified lung: Secondary | ICD-10-CM

## 2017-12-01 LAB — COMPREHENSIVE METABOLIC PANEL
ALT: 10 U/L (ref 0–55)
AST: 12 U/L (ref 5–34)
Albumin: 3.6 g/dL (ref 3.5–5.0)
Alkaline Phosphatase: 57 U/L (ref 40–150)
Anion Gap: 5 mEq/L (ref 3–11)
BUN: 20.7 mg/dL (ref 7.0–26.0)
CHLORIDE: 108 meq/L (ref 98–109)
CO2: 27 meq/L (ref 22–29)
CREATININE: 1.1 mg/dL (ref 0.6–1.1)
Calcium: 9 mg/dL (ref 8.4–10.4)
EGFR: 46 mL/min/{1.73_m2} — ABNORMAL LOW (ref >60)
GLUCOSE: 91 mg/dL (ref 70–140)
POTASSIUM: 4.3 meq/L (ref 3.5–5.1)
SODIUM: 139 meq/L (ref 136–145)
Total Bilirubin: 0.36 mg/dL (ref 0.20–1.20)
Total Protein: 6.5 g/dL (ref 6.4–8.3)

## 2017-12-01 LAB — CBC WITH DIFFERENTIAL/PLATELET
BASO%: 2.5 % — ABNORMAL HIGH (ref 0.0–2.0)
BASOS ABS: 0.1 10*3/uL (ref 0.0–0.1)
EOS%: 1.7 % (ref 0.0–7.0)
Eosinophils Absolute: 0.1 10*3/uL (ref 0.0–0.5)
HCT: 35.8 % (ref 34.8–46.6)
HGB: 11.6 g/dL (ref 11.6–15.9)
LYMPH%: 32.7 % (ref 14.0–49.7)
MCH: 31.8 pg (ref 25.1–34.0)
MCHC: 32.4 g/dL (ref 31.5–36.0)
MCV: 98.1 fL (ref 79.5–101.0)
MONO#: 0.2 10*3/uL (ref 0.1–0.9)
MONO%: 5.5 % (ref 0.0–14.0)
NEUT#: 2.1 10*3/uL (ref 1.5–6.5)
NEUT%: 57.6 % (ref 38.4–76.8)
PLATELETS: 315 10*3/uL (ref 145–400)
RBC: 3.65 10*6/uL — AB (ref 3.70–5.45)
RDW: 15.9 % — ABNORMAL HIGH (ref 11.2–14.5)
WBC: 3.6 10*3/uL — ABNORMAL LOW (ref 3.9–10.3)
lymph#: 1.2 10*3/uL (ref 0.9–3.3)

## 2017-12-01 MED ORDER — FULVESTRANT 250 MG/5ML IM SOLN
INTRAMUSCULAR | Status: AC
  Filled 2017-12-01: qty 5

## 2017-12-01 MED ORDER — FULVESTRANT 250 MG/5ML IM SOLN: 500.0000 mg | Freq: Once | INTRAMUSCULAR | Status: DC

## 2017-12-01 NOTE — Progress Notes (Signed)
Pt did not receive injection today due to leaving facility. Pt was arrived later to this nurse. Called and talked to pt son Meta Kroenke), stated "I'll call the person driving my and I'll let her know." Requested that pt can either come back and receive injection or pt can call facility with another date to receive injection. Sherril Croon NP desk nurse Cecille Rubin) to inform of pt not being available for injection. Mattia Liford LPN

## 2017-12-01 NOTE — Telephone Encounter (Signed)
Gave patient AVS and calendar of upcoming January appointments °

## 2017-12-01 NOTE — Progress Notes (Signed)
ID: April [name]   DOB: October 04, 1929  MR#: [id number]  VOH#:[id number]  PCP: Laurey Morale, MD GYN:  SU: Rolm Bookbinder OTHER MD: Franchot Gallo, Kyung Rudd, Lucio Edward, [facility]  CHIEF COMPLAINT: bilateral breast cancer, status post bilateral mastectomies  CURRENT THERAPY: Fulvestrant and Palbociclib.   BREAST CANCER HISTORY: According to Dr. Julien Girt 02/23/2011 note:  She presented with a palpable mass in her left breast.  She was referred for a mammogram on 02/14/2011.  At that time diagnostic mammogram and ultrasound showed scattered fibroglandular densities in the lateral aspect of the left breast there was an irregular spiculated mass measuring 3.5 x 4.1 x 3.8 cm.  The physical exam at that time showed a hard palpable mass at 3 o'clock position in the left breast with some skin retraction.  Ultrasound was performed, which showed a hypoechoic mass with shadowing 10 cm from the nipple measuring 2.2 x 2.0 mm.  In the right breast there was two hypoechoic masses at 12 o'clock position 3 cm from the nipple measuring 5 x 5 mm each.  Biopsies recommended.  Biopsy on the suspicious lymph node was done at the same time is the primary mass on 02/14/2011.  Pathology showed this to be invasive mammary carcinoma.  The left axillary mass has insufficient tissue for diagnosis, the tumor was ER and PR positive, 100% strong staining intensity, proliferative index low at 36%.  The HER-2 ratio was 1.19.  The patient underwent a MRI scan of both breasts on 02/21/2011.  In the tail of the left breast there was a mass measuring 2.7 x 2.6 x 2.9 cm medial margin of both pectoralis muscle.  In the right breast there was clumped nodular enhancement at 12 o'clock position.  This area measured 1.8 cm.  Sentinel left axillary lymph node was also noted, which appeared to be suspicious. Her subsequent history is as detailed below.  INTERVAL HISTORY: April [name] returns today for follow-up and treatment of her stage IV  estrogen receptor positive breast cancer. She continues on fulvestrant, with a dose due today. She tolerates this well.  She also takes Palbociclib 3 weeks on one week off and tolerates this as well.     REVIEW OF SYSTEMS: Ranelle is doing well today.  She had a good holiday.  She flew to [facility] to see her son.  She is taking the Palbociclib three weeks on and one week off.  She denies any issues with taking it.  She denies any nausea, vomiting, fatigue, new pain, or any other concerns today.  A detailed ROS was non contributory.    PAST MEDICAL HISTORY: Past Medical History:  Diagnosis Date  . Adenomatous colon polyp 11/2005  . ALLERGIC RHINITIS 04/18/2007  . Anemia   . Arthritis   . Breast cancer [facility]) 2012    Dr. [name], ER/PR positive L breast  . Bronchitis    taking levaquin now for it.  Still runs a low grade fever at times  . BURSITIS 01/04/2008  . COSTOCHONDRITIS 11/05/2007  . GERD 04/18/2007  . Hiatal hernia   . History of gallstones   . History of pneumonia   . Hx of radiation therapy 10/26/11 to 12/16/11   L chest wall, L supraclavicular region  . HYPERTENSION 09/30/2009  . Hypothyroidism    YRS AGO.....THINGS ARE FINE NOW  . Internal hemorrhoids   . LOW BACK PAIN SYNDROME 07/13/2009  . Melanoma ([place])    removed from upper back  . Pneumonia   . ROTATOR  CUFF SYNDROME 05/06/2010    PAST SURGICAL HISTORY: Past Surgical History:  Procedure Laterality Date  . ANKLE SURGERY     right  . BREAST SURGERY  1968   left bx - noncancerous  . CATARACT EXTRACTION     bilateral  . CHOLECYSTECTOMY  1979  . excision of back melanoma    . JOINT REPLACEMENT     left knee  . MASTECTOMY  08/25/11   bilateral , per Dr. Donne Hazel  . SENTINEL LYMPH NODE BIOPSY  08/25/11   bilateral  . TONSILLECTOMY AND ADENOIDECTOMY    . TOTAL KNEE ARTHROPLASTY Right 03/27/2015   Procedure: TOTAL KNEE ARTHROPLASTY;  Surgeon: [name], MD;  Location: [facility];  Service: Orthopedics;  Laterality: Right;     FAMILY HISTORY Family History  Problem Relation Age of Onset  . Stroke Father   . Alcohol abuse Sister   . Cirrhosis Sister   . Thyroid cancer Mother   . Retinal detachment Mother   . Diabetes Paternal Uncle   . Asthma Maternal Grandmother    the patient's father died at the age of 65 following a stroke. He had an independent in driving up to one month prior to dying. [name] died from thyroid cancer at the age of 36. This had been diagnosed less than a year before her death. The patient had one brother and one sister. There is no history of breast or ovarian cancer in the family to her knowledge.  GYNECOLOGIC HISTORY: Menarche age 74, first live birth age 30, she is [place] P2. She underwent menopause around 33. She did not take hormone replacement.  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 She is a widow and lives by herself. Son Casey Burkitt [place] the third lives in [place] and works for the [employer]. Daughter [name] lives in [place] and works with a [employer]. The patient has 3 grandchildren. She attends a [employer].   ADVANCED DIRECTIVES: In place  HEALTH MAINTENANCE: Social History   Tobacco Use  . Smoking status: Passive Smoke Exposure - Never Smoker  . Smokeless tobacco: Never Used  . Tobacco comment: as a child.   Substance Use Topics  . Alcohol use: Yes    Alcohol/week: 0.6 oz    Types: 1 Standard drinks or equivalent per week    Comment: socially  . Drug use: No     Colonoscopy:  PAP:  Bone density:  Lipid panel:  Allergies  Allergen Reactions  . Cefaclor Hives and Itching    Purple splotches  . Lansoprazole Hives and Itching    Purple splotches, hurting  . Penicillins Hives and Itching    Purple splotches  . Sulfa Drugs Cross Reactors Other (See Comments)    Purple whelps, itch, "just go crazy"    Current Outpatient Medications  Medication Sig Dispense  Refill  . albuterol (PROVENTIL HFA;VENTOLIN HFA) 108 (90 BASE) MCG/ACT inhaler Inhale 2 puffs into the lungs every 4 (four) hours as needed for wheezing or shortness of breath. 1 Inhaler 2  . ALPRAZolam (XANAX) 0.25 MG tablet Take 1 tablet (0.25 mg total) by mouth as directed. 2 tablet 0  . clotrimazole-betamethasone (LOTRISONE) cream Apply 1 application 2 (two) times daily topically. 30 g 0  . fluticasone (FLONASE) 50 MCG/ACT nasal spray Place 1 spray into both nostrils 2 (two) times daily. 16 g 6  . guaifenesin (ROBITUSSIN) 100 MG/5ML syrup Take 100 mg by mouth at bedtime.    Marland Kitchen  HYDROcodone-homatropine (HYDROMET) 5-1.5 MG/5ML syrup Take 5 mLs by mouth every 4 (four) hours as needed. 240 mL 0  . ibuprofen (ADVIL,MOTRIN) 200 MG tablet Take 200 mg by mouth every 6 (six) hours as needed.    Marland Kitchen lisinopril (PRINIVIL,ZESTRIL) 20 MG tablet Take 1 tablet (20 mg total) by mouth daily. 90 tablet 0  . omeprazole (PRILOSEC) 40 MG capsule TAKE (1) CAPSULE TWICE DAILY. 60 capsule 5  . palbociclib (IBRANCE) 100 MG capsule Take 1 capsule (100 mg total) by mouth daily with breakfast. Take for 21 days on, 7 days off. 21 capsule 6  . Polyethyl Glycol-Propyl Glycol (SYSTANE) 0.4-0.3 % SOLN Place 1 drop into both eyes 2 (two) times daily as needed (dry eyes).     . prochlorperazine (COMPAZINE) 5 MG tablet Take 1 tablet (5 mg total) every 6 (six) hours as needed by mouth for nausea or vomiting. 30 tablet 0   No current facility-administered medications for this visit.     OBJECTIVE:   There were no vitals filed for this visit.   There is no height or weight on file to calculate BMI.    ECOG FS: 1 GENERAL: Patient is a well appearing female in no acute distress HEENT:  Sclerae anicteric.  Oropharynx clear and moist. No ulcerations or evidence of oropharyngeal candidiasis. Neck is supple.  NODES:  No cervical, supraclavicular, or axillary lymphadenopathy palpated.  BREAST EXAM:  S/p bilateral mastectomies.  Dry,  erythematous rash horizontally across chest wall, appears fungal LUNGS:  Clear to auscultation bilaterally.  No wheezes or rhonchi. HEART:  Regular rate and rhythm. No murmur appreciated. ABDOMEN:  Soft, nontender.  Positive, normoactive bowel sounds. No organomegaly palpated. MSK:  No focal spinal tenderness to palpation. Full range of motion bilaterally in the upper extremities. EXTREMITIES:  No peripheral edema.   SKIN:  Clear with no obvious rashes or skin changes. No nail dyscrasia. NEURO:  Nonfocal. Well oriented.  Appropriate affect.    LAB RESULTS: Lab Results  Component Value Date   WBC 3.6 (L) 12/01/2017   NEUTROABS 2.1 12/01/2017   HGB 11.6 12/01/2017   HCT 35.8 12/01/2017   MCV 98.1 12/01/2017   PLT 315 12/01/2017      Chemistry      Component Value Date/Time   NA 139 11/03/2017 1111   K 4.4 11/03/2017 1111   CL 104 04/11/2017 1152   CL 108 (H) 12/25/2012 1235   CO2 25 11/03/2017 1111   BUN 22.2 11/03/2017 1111   CREATININE 1.1 11/03/2017 1111      Component Value Date/Time   CALCIUM 9.5 11/03/2017 1111   ALKPHOS 55 11/03/2017 1111   AST 12 11/03/2017 1111   ALT 8 11/03/2017 1111   BILITOT 0.28 11/03/2017 1111       Lab Results  Component Value Date   LABCA2 12 12/25/2012    No components found for: [id number]  No results for input(s): INR in the last 168 hours.  Urinalysis    Component Value Date/Time   COLORURINE YELLOW 03/13/2015 1042   APPEARANCEUR CLEAR 03/13/2015 1042   LABSPEC 1.007 03/13/2015 1042   PHURINE 6.5 03/13/2015 1042   GLUCOSEU NEGATIVE 03/13/2015 1042   HGBUR NEGATIVE 03/13/2015 1042   HGBUR negative 05/24/2010 1555   BILIRUBINUR neg 09/08/2015 1557   KETONESUR NEGATIVE 03/13/2015 1042   PROTEINUR neg 09/08/2015 1557   PROTEINUR NEGATIVE 03/13/2015 1042   UROBILINOGEN 0.2 09/08/2015 1557   UROBILINOGEN 0.2 03/13/2015 1042   NITRITE neg 09/08/2015 1557  NITRITE NEGATIVE 03/13/2015 1042   LEUKOCYTESUR Negative  09/08/2015 1557    STUDIES: No results found.   ASSESSMENT: 84 y.o. 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  (2) status post bilateral mastectomies 08/25/2011, showing  (a) on the right, ductal carcinoma in situ, low-grade, estrogen receptor 100% and progesterone receptor 80% positive, with ample margins  (b) on the left, a pT2 pN1, stage IIB invasive ductal carcinoma, grade 1, 100% estrogen and 100% progesterone receptor positive, with an MIB-1 of 36%, and no HER-2 amplification.  (3) left postmastectomy radiation completed 12/27/2011  (4) took aromatase inhibitors (letrozole and anastrozole) prior to her bilateral mastectomies, with poor tolerance. Took tamoxifen briefly after completing radiation, and again with poor tolerance. 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  (a) PET SCAN: Left upper lobe mass hypermetabolic with SUV max 129.1 Hypermetabolic subcarinal and contralateral right lower paratracheal metabolic adenopathy SUV 8.4, hypermetabolic right hilar lymph nodes SUV max 5.4, small hypermetabolic rigth supraclavicular lymph nodes, small but intense activity for size SUV max 4, Hypermetabolic activity along medial border of left pectoralis muscle SUV 5.8. Mildly anterior margin of the  right kidney 3.6cm mass concerning for renal cell carcinoma.    (b) 04/11/2017: core biopsy of left subpectoral mass: invasive mammary carcinoma, ER+(95%), PR+(95%), Ki 67 2%, HER-2 negative (ratio 1.25).   (c) CA-27-29 is noninformative  (d) CT chest 09/26/2017 shows no evidence of progression  (2) Fulvestrant started on 05/08/2017 with Palbociclib added on 06/05/2017 at 100 mg/d      PLAN:  LTraceis doing well.  She is tolerating her treatment well and will continue with Fulvestrant and Palbociclib.  She will proceed with Fulvestrant injection.  She will return in 4 weeks for labs, f/u with Dr. MJana Hakim and her next Fulvestrant injection.  I have ordered a CT chest to be done prior to that appointment.  I reviewed this in detail with the patient and she verbalized understanding.    She knows to call for any problems that may develop before her next visit here.  A total of (20) minutes of face-to-face time was spent with this patient with greater than 50% of that time in counseling and care-coordination.  The above was reviewed with Dr. MJana Hakimtoday.  LScot Dock   12/01/2017

## 2017-12-06 ENCOUNTER — Other Ambulatory Visit: Payer: Self-pay | Admitting: Pharmacist

## 2017-12-08 ENCOUNTER — Other Ambulatory Visit: Payer: Self-pay | Admitting: Oncology

## 2017-12-08 DIAGNOSIS — C50012 Malignant neoplasm of nipple and areola, left female breast: Secondary | ICD-10-CM

## 2017-12-08 DIAGNOSIS — Z170 Estrogen receptor positive status [ER+]: Principal | ICD-10-CM

## 2017-12-11 MED FILL — IBRANCE 100 MG CAPSULE: 100 | 21 days supply | Qty: 21 | Fill #0 | Status: TO

## 2017-12-18 ENCOUNTER — Ambulatory Visit (HOSPITAL_COMMUNITY): Payer: Medicare Other

## 2017-12-25 NOTE — Progress Notes (Signed)
ID: April [name]   DOB: 06/21/1929  MR#: [id number]  YIF#:[id number]  PCP: Laurey Morale, MD GYN:  SU: Rolm Bookbinder OTHER MD: Franchot Gallo, Kyung Rudd, Lucio Edward, [facility]  CHIEF COMPLAINT: bilateral breast cancer, status post bilateral mastectomies  CURRENT THERAPY: Fulvestrant and Palbociclib. Xgeva pending  BREAST CANCER HISTORY: According to Dr. Julien Girt 02/23/2011 note:  She presented with a palpable mass in her left breast.  She was referred for a mammogram on 02/14/2011.  At that time diagnostic mammogram and ultrasound showed scattered fibroglandular densities in the lateral aspect of the left breast there was an irregular spiculated mass measuring 3.5 x 4.1 x 3.8 cm.  The physical exam at that time showed a hard palpable mass at 3 o'clock position in the left breast with some skin retraction.  Ultrasound was performed, which showed a hypoechoic mass with shadowing 10 cm from the nipple measuring 2.2 x 2.0 mm.  In the right breast there was two hypoechoic masses at 12 o'clock position 3 cm from the nipple measuring 5 x 5 mm each.  Biopsies recommended.  Biopsy on the suspicious lymph node was done at the same time is the primary mass on 02/14/2011.  Pathology showed this to be invasive mammary carcinoma.  The left axillary mass has insufficient tissue for diagnosis, the tumor was ER and PR positive, 100% strong staining intensity, proliferative index low at 36%.  The HER-2 ratio was 1.19.  The patient underwent a MRI scan of both breasts on 02/21/2011.  In the tail of the left breast there was a mass measuring 2.7 x 2.6 x 2.9 cm medial margin of both pectoralis muscle.  In the right breast there was clumped nodular enhancement at 12 o'clock position.  This area measured 1.8 cm.  Sentinel left axillary lymph node was also noted, which appeared to be suspicious. Her subsequent history is as detailed below.  INTERVAL HISTORY: April [name] returns today for follow-up and treatment of  her stage IV estrogen receptor positive breast cancer. She continues on fulvestrant, with her next dose due 12/29/2017. She tolerates this well.  She also receives palbociclib, currently at 100 mg daily, 21 days on 7 days off. She notes that she is either on the second or third week. She notes that she is fatigued during the day. She notes that she can only do one major activity like shopping, and then she is very tired.  However overall she says she feels better on Ibrance then off it.   REVIEW OF SYSTEMS: April [name] reports that she is doing well. She notes that she spent her holidays visiting her son in [place], [place]. She denies unusual headaches, visual changes, nausea, vomiting, or dizziness. There has been no unusual cough, phlegm production, or pleurisy. This been no change in bowel or bladder habits. She denies unexplained fatigue or unexplained weight loss, bleeding, rash, or fever. A detailed review of systems was otherwise stable.   PAST MEDICAL HISTORY: Past Medical History:  Diagnosis Date  . Adenomatous colon polyp 11/2005  . ALLERGIC RHINITIS 04/18/2007  . Anemia   . Arthritis   . Breast cancer [facility] 2012    Dr. [name], ER/PR positive L breast  . Bronchitis    taking levaquin now for it.  Still runs a low grade fever at times  . BURSITIS 01/04/2008  . COSTOCHONDRITIS 11/05/2007  . GERD 04/18/2007  . Hiatal hernia   . History of gallstones   . History of pneumonia   . Hx of  radiation therapy 10/26/11 to 12/16/11   L chest wall, L supraclavicular region  . HYPERTENSION 09/30/2009  . Hypothyroidism    YRS AGO.....THINGS ARE FINE NOW  . Internal hemorrhoids   . LOW BACK PAIN SYNDROME 07/13/2009  . Melanoma ([place])    removed from upper back  . Pneumonia   . ROTATOR CUFF SYNDROME 05/06/2010    PAST SURGICAL HISTORY: Past Surgical History:  Procedure Laterality Date  . ANKLE SURGERY     right  . BREAST SURGERY  1968   left bx - noncancerous  . CATARACT EXTRACTION     bilateral  .  CHOLECYSTECTOMY  1979  . excision of back melanoma    . JOINT REPLACEMENT     left knee  . MASTECTOMY  08/25/11   bilateral , per Dr. Donne Hazel  . SENTINEL LYMPH NODE BIOPSY  08/25/11   bilateral  . TONSILLECTOMY AND ADENOIDECTOMY    . TOTAL KNEE ARTHROPLASTY Right 03/27/2015   Procedure: TOTAL KNEE ARTHROPLASTY;  Surgeon: [name], MD;  Location: [facility];  Service: Orthopedics;  Laterality: Right;    FAMILY HISTORY Family History  Problem Relation Age of Onset  . Stroke Father   . Alcohol abuse Sister   . Cirrhosis Sister   . Thyroid cancer Mother   . Retinal detachment Mother   . Diabetes Paternal Uncle   . Asthma Maternal Grandmother    the patient's father died at the age of 60 following a stroke. He had an independent in driving up to one month prior to dying. [name] died from thyroid cancer at the age of 59. This had been diagnosed less than a year before her death. The patient had one brother and one sister. There is no history of breast or ovarian cancer in the family to her knowledge.  GYNECOLOGIC HISTORY: Menarche age 21, first live birth age 28, she is [place] P2. She underwent menopause around 50. She did not take hormone replacement.  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 She is a widow and lives by herself. Son Casey Burkitt Pesotum the third lives in [place] and works for the [employer]. Daughter [name] lives in [place] and works with a [employer]. The patient has 3 grandchildren. She attends a [employer].   ADVANCED DIRECTIVES: In place  HEALTH MAINTENANCE: Social History   Tobacco Use  . Smoking status: Passive Smoke Exposure - Never Smoker  . Smokeless tobacco: Never Used  . Tobacco comment: as a child.   Substance Use Topics  . Alcohol use: Yes    Alcohol/week: 0.6 oz    Types: 1 Standard drinks or equivalent per week    Comment: socially  . Drug use: No      Colonoscopy:  PAP:  Bone density:  Lipid panel:  Allergies  Allergen Reactions  . Cefaclor Hives and Itching    Purple splotches  . Lansoprazole Hives and Itching    Purple splotches, hurting  . Penicillins Hives and Itching    Purple splotches  . Sulfa Drugs Cross Reactors Other (See Comments)    Purple whelps, itch, "just go crazy"    Current Outpatient Medications  Medication Sig Dispense Refill  . albuterol (PROVENTIL HFA;VENTOLIN HFA) 108 (90 BASE) MCG/ACT inhaler Inhale 2 puffs into the lungs every 4 (four) hours as needed for wheezing or shortness of breath. 1 Inhaler 2  . ALPRAZolam (XANAX) 0.25 MG tablet Take 1 tablet (0.25 mg total) by  mouth as directed. 2 tablet 0  . clotrimazole-betamethasone (LOTRISONE) cream Apply 1 application 2 (two) times daily topically. 30 g 0  . fluticasone (FLONASE) 50 MCG/ACT nasal spray Place 1 spray into both nostrils 2 (two) times daily. 16 g 6  . guaifenesin (ROBITUSSIN) 100 MG/5ML syrup Take 100 mg by mouth at bedtime.    Marland Kitchen HYDROcodone-homatropine (HYDROMET) 5-1.5 MG/5ML syrup Take 5 mLs by mouth every 4 (four) hours as needed. 240 mL 0  . IBRANCE 100 MG capsule TAKE 1 CAPSULE (100 MG TOTAL) BY MOUTH DAILY WITH BREAKFAST. TAKE FOR 21 DAYS ON, 7 DAYS OFF. 21 capsule 6  . ibuprofen (ADVIL,MOTRIN) 200 MG tablet Take 200 mg by mouth every 6 (six) hours as needed.    Marland Kitchen lisinopril (PRINIVIL,ZESTRIL) 20 MG tablet Take 1 tablet (20 mg total) by mouth daily. 90 tablet 0  . omeprazole (PRILOSEC) 40 MG capsule TAKE (1) CAPSULE TWICE DAILY. 60 capsule 5  . Polyethyl Glycol-Propyl Glycol (SYSTANE) 0.4-0.3 % SOLN Place 1 drop into both eyes 2 (two) times daily as needed (dry eyes).     . prochlorperazine (COMPAZINE) 5 MG tablet Take 1 tablet (5 mg total) every 6 (six) hours as needed by mouth for nausea or vomiting. 30 tablet 0   No current facility-administered medications for this visit.     OBJECTIVE: Elderly white woman in no acute  distress  Vitals:   12/27/17 1307  BP: (!) 169/74  Pulse: 74  Resp: 18  Temp: 98.3 F (36.8 C)  SpO2: 100%     Body mass index is 32.26 kg/m.    ECOG FS: 1  Sclerae unicteric, EOMs intact Oropharynx clear and moist No cervical or supraclavicular adenopathy Lungs no rales or rhonchi Heart regular rate and rhythm Abd soft, nontender, positive bowel sounds MSK no focal spinal tenderness, no upper extremity lymphedema, uses a cane Neuro: nonfocal, well oriented, appropriate affect Breasts: Deferred     LAB RESULTS: Lab Results  Component Value Date   WBC 3.8 (L) 12/27/2017   NEUTROABS 2.0 12/27/2017   HGB 11.5 (L) 12/27/2017   HCT 34.8 12/27/2017   MCV 95.8 12/27/2017   PLT 297 12/27/2017      Chemistry      Component Value Date/Time   NA 139 12/27/2017 1235   NA 139 12/01/2017 1127   K 4.2 12/27/2017 1235   K 4.3 12/01/2017 1127   CL 109 12/27/2017 1235   CL 108 (H) 12/25/2012 1235   CO2 23 12/27/2017 1235   CO2 27 12/01/2017 1127   BUN 24 12/27/2017 1235   BUN 20.7 12/01/2017 1127   CREATININE 1.01 12/27/2017 1235   CREATININE 1.1 12/01/2017 1127      Component Value Date/Time   CALCIUM 9.3 12/27/2017 1235   CALCIUM 9.0 12/01/2017 1127   ALKPHOS 62 12/27/2017 1235   ALKPHOS 57 12/01/2017 1127   AST 12 12/27/2017 1235   AST 12 12/01/2017 1127   ALT 9 12/27/2017 1235   ALT 10 12/01/2017 1127   BILITOT <0.2 (L) 12/27/2017 1235   BILITOT 0.36 12/01/2017 1127       Lab Results  Component Value Date   LABCA2 12 12/25/2012    No components found for: [id number]  No results for input(s): INR in the last 168 hours.  Urinalysis    Component Value Date/Time   COLORURINE YELLOW 03/13/2015 1042   APPEARANCEUR CLEAR 03/13/2015 1042   LABSPEC 1.007 03/13/2015 1042   PHURINE 6.5 03/13/2015 1042   GLUCOSEU  NEGATIVE 03/13/2015 1042   HGBUR NEGATIVE 03/13/2015 1042   HGBUR negative 05/24/2010 1555   BILIRUBINUR neg 09/08/2015 [place]  03/13/2015 1042   PROTEINUR neg 09/08/2015 1557   PROTEINUR NEGATIVE 03/13/2015 1042   UROBILINOGEN 0.2 09/08/2015 1557   UROBILINOGEN 0.2 03/13/2015 1042   NITRITE neg 09/08/2015 1557   NITRITE NEGATIVE 03/13/2015 1042   LEUKOCYTESUR Negative 09/08/2015 1557    STUDIES: Chest CT scan 09/26/2017 describes a sclerotic manubrium lesion as "unchanged", and no change in the nodule anterior to the left humerus  ASSESSMENT: 84 y.o. 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  (2) status post bilateral mastectomies 08/25/2011, showing  (a) on the right, ductal carcinoma in situ, low-grade, estrogen receptor 100% and progesterone receptor 80% positive, with ample margins  (b) on the left, a pT2 pN1, stage IIB invasive ductal carcinoma, grade 1, 100% estrogen and 100% progesterone receptor positive, with an MIB-1 of 36%, and no HER-2 amplification.  (3) left postmastectomy radiation completed 12/27/2011  (4) took aromatase inhibitors (letrozole and anastrozole) prior to her bilateral mastectomies, with poor tolerance. Took tamoxifen briefly after completing radiation, and again with poor tolerance. 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  (a) PET SCAN: Left upper lobe mass hypermetabolic with SUV max  194.7 Hypermetabolic subcarinal and contralateral right lower paratracheal metabolic adenopathy SUV 8.4, hypermetabolic right hilar lymph nodes SUV max 5.4, small hypermetabolic rigth supraclavicular lymph nodes, small but intense activity for size SUV max 4, Hypermetabolic activity along medial border of left pectoralis muscle SUV 5.8. Mildly anterior margin of the right kidney 3.6cm mass concerning for renal cell carcinoma.    (b) 04/11/2017: core biopsy of left subpectoral mass: invasive mammary carcinoma, ER+(95%), PR+(95%), Ki 67 2%, HER-2 negative (ratio 1.25).   (c( CA-27-29 is noninformative  (2) Fulvestrant started on 05/08/2017 with Palbociclib added on 06/05/2017 at 100 mg/d    (a) chest CT scan 09/26/2017 stable, question sclerotic sternal metastasis, stable left humeral metastasis  (3) denosumab/Xgeva to be added 01/24/2018, to be continued every 4 weeks x3 then at longer intervals   PLAN:  LVirginais doing remarkably well, and "if I did not know I had cancer I would not know I have added.".  She is tolerating the fulvestrant and palbociclib without any significant side effects.  Her counts remain ample and I do not have to adjust the palbociclib dose at this point.  I reviewed the most recent CT results with her.  She does have 2 bone lesions.  This will allow uKoreato add denosumab/Xgeva.  I think this will be helpful to her long-term.  We discussed the possible toxicity side effects and complications and including the rare occurrence of osteonecrosis of the jaw.  She just saw her dentist and her teeth are in good repair.  I have asked her to get  some Caltrate D to take daily and to take 2 tablets, 1 twice a day, on the day of her Xgeva.  We are going to start 4 weeks from now since this needs to be pre-approved  I am going to see her again in May.  She will have a PET scan shortly before that visit.   Criselda Starke, Virgie Dad, MD  12/27/17 1:39 PM Medical Oncology and Hematology [facility] [address] [place], [place] [address] Tel. [phone]    Fax. [phone]  This document serves as a record of services personally performed by Lurline Del, MD. It was created on his behalf by Sheron Nightingale, a trained medical scribe. The creation of this record is based on the scribe's personal observations and the provider's statements to them.   I have reviewed the above documentation for accuracy and completeness, and I agree with the above.

## 2017-12-27 ENCOUNTER — Inpatient Hospital Stay: Payer: Medicare Other

## 2017-12-27 ENCOUNTER — Telehealth: Payer: Self-pay | Admitting: Oncology

## 2017-12-27 ENCOUNTER — Inpatient Hospital Stay: Payer: Medicare Other | Attending: Oncology

## 2017-12-27 ENCOUNTER — Inpatient Hospital Stay (HOSPITAL_BASED_OUTPATIENT_CLINIC_OR_DEPARTMENT_OTHER): Payer: Medicare Other | Admitting: Oncology

## 2017-12-27 VITALS — BP 169/74 | HR 74 | Temp 98.3°F | Resp 18 | Ht 62.0 in | Wt 176.4 lb

## 2017-12-27 DIAGNOSIS — E039 Hypothyroidism, unspecified: Secondary | ICD-10-CM | POA: Diagnosis not present

## 2017-12-27 DIAGNOSIS — Z808 Family history of malignant neoplasm of other organs or systems: Secondary | ICD-10-CM | POA: Insufficient documentation

## 2017-12-27 DIAGNOSIS — M545 Low back pain: Secondary | ICD-10-CM | POA: Diagnosis not present

## 2017-12-27 DIAGNOSIS — R5383 Other fatigue: Secondary | ICD-10-CM | POA: Insufficient documentation

## 2017-12-27 DIAGNOSIS — C7800 Secondary malignant neoplasm of unspecified lung: Secondary | ICD-10-CM

## 2017-12-27 DIAGNOSIS — Z923 Personal history of irradiation: Secondary | ICD-10-CM | POA: Diagnosis not present

## 2017-12-27 DIAGNOSIS — M129 Arthropathy, unspecified: Secondary | ICD-10-CM | POA: Diagnosis not present

## 2017-12-27 DIAGNOSIS — D649 Anemia, unspecified: Secondary | ICD-10-CM | POA: Insufficient documentation

## 2017-12-27 DIAGNOSIS — Z79818 Long term (current) use of other agents affecting estrogen receptors and estrogen levels: Secondary | ICD-10-CM | POA: Insufficient documentation

## 2017-12-27 DIAGNOSIS — Z86010 Personal history of colonic polyps: Secondary | ICD-10-CM | POA: Diagnosis not present

## 2017-12-27 DIAGNOSIS — Z8701 Personal history of pneumonia (recurrent): Secondary | ICD-10-CM

## 2017-12-27 DIAGNOSIS — Z9049 Acquired absence of other specified parts of digestive tract: Secondary | ICD-10-CM | POA: Insufficient documentation

## 2017-12-27 DIAGNOSIS — Z85820 Personal history of malignant melanoma of skin: Secondary | ICD-10-CM | POA: Diagnosis not present

## 2017-12-27 DIAGNOSIS — Z170 Estrogen receptor positive status [ER+]: Secondary | ICD-10-CM | POA: Insufficient documentation

## 2017-12-27 DIAGNOSIS — D0511 Intraductal carcinoma in situ of right breast: Secondary | ICD-10-CM | POA: Insufficient documentation

## 2017-12-27 DIAGNOSIS — I10 Essential (primary) hypertension: Secondary | ICD-10-CM

## 2017-12-27 DIAGNOSIS — K219 Gastro-esophageal reflux disease without esophagitis: Secondary | ICD-10-CM | POA: Diagnosis not present

## 2017-12-27 DIAGNOSIS — C7951 Secondary malignant neoplasm of bone: Secondary | ICD-10-CM | POA: Insufficient documentation

## 2017-12-27 DIAGNOSIS — C7802 Secondary malignant neoplasm of left lung: Secondary | ICD-10-CM

## 2017-12-27 DIAGNOSIS — C50912 Malignant neoplasm of unspecified site of left female breast: Secondary | ICD-10-CM

## 2017-12-27 DIAGNOSIS — K449 Diaphragmatic hernia without obstruction or gangrene: Secondary | ICD-10-CM | POA: Diagnosis not present

## 2017-12-27 DIAGNOSIS — Z9013 Acquired absence of bilateral breasts and nipples: Secondary | ICD-10-CM | POA: Diagnosis not present

## 2017-12-27 DIAGNOSIS — C50012 Malignant neoplasm of nipple and areola, left female breast: Secondary | ICD-10-CM

## 2017-12-27 DIAGNOSIS — K649 Unspecified hemorrhoids: Secondary | ICD-10-CM | POA: Diagnosis not present

## 2017-12-27 DIAGNOSIS — C50812 Malignant neoplasm of overlapping sites of left female breast: Secondary | ICD-10-CM | POA: Insufficient documentation

## 2017-12-27 LAB — COMPREHENSIVE METABOLIC PANEL
ALT: 9 U/L (ref 0–55)
AST: 12 U/L (ref 5–34)
Albumin: 3.7 g/dL (ref 3.5–5.0)
Alkaline Phosphatase: 62 U/L (ref 40–150)
Anion gap: 7 (ref 3–11)
BUN: 24 mg/dL (ref 7–26)
CO2: 23 mmol/L (ref 22–29)
CREATININE: 1.01 mg/dL (ref 0.60–1.10)
Calcium: 9.3 mg/dL (ref 8.4–10.4)
Chloride: 109 mmol/L (ref 98–109)
GFR calc non Af Amer: 49 mL/min — ABNORMAL LOW (ref >60)
GFR, EST AFRICAN AMERICAN: 57 mL/min — AB (ref >60)
Glucose, Bld: 117 mg/dL (ref 70–140)
POTASSIUM: 4.2 mmol/L (ref 3.5–5.1)
Sodium: 139 mmol/L (ref 136–145)
Total Bilirubin: <0.2 mg/dL — ABNORMAL LOW (ref 0.2–1.2)
Total Protein: 6.7 g/dL (ref 6.4–8.3)

## 2017-12-27 LAB — CBC WITH DIFFERENTIAL/PLATELET
BASOS ABS: 0.1 10*3/uL (ref 0.0–0.1)
Basophils Relative: 3 %
EOS ABS: 0 10*3/uL (ref 0.0–0.5)
EOS PCT: 1 %
HCT: 34.8 % (ref 34.8–46.6)
Hemoglobin: 11.5 g/dL — ABNORMAL LOW (ref 11.6–15.9)
Lymphocytes Relative: 35 %
Lymphs Abs: 1.3 10*3/uL (ref 0.9–3.3)
MCH: 31.6 pg (ref 25.1–34.0)
MCHC: 33 g/dL (ref 31.5–36.0)
MCV: 95.8 fL (ref 79.5–101.0)
Monocytes Absolute: 0.2 10*3/uL (ref 0.1–0.9)
Monocytes Relative: 6 %
Neutro Abs: 2 10*3/uL (ref 1.5–6.5)
Neutrophils Relative %: 55 %
PLATELETS: 297 10*3/uL (ref 145–400)
RBC: 3.63 MIL/uL — AB (ref 3.70–5.45)
RDW: 16.4 % — ABNORMAL HIGH (ref 11.2–14.5)
WBC: 3.8 10*3/uL — AB (ref 3.9–10.3)

## 2017-12-27 MED ORDER — FULVESTRANT 250 MG/5ML IM SOLN
500.0000 mg | Freq: Once | INTRAMUSCULAR | Status: AC
  Administered 2017-12-27: 500 mg via INTRAMUSCULAR

## 2017-12-27 MED ORDER — FULVESTRANT 250 MG/5ML IM SOLN
INTRAMUSCULAR | Status: AC
  Filled 2017-12-27: qty 5

## 2017-12-27 NOTE — Telephone Encounter (Signed)
Gave patient AVs and calendar of upcoming February through May appointments.

## 2017-12-27 NOTE — Patient Instructions (Signed)

## 2018-01-04 ENCOUNTER — Other Ambulatory Visit: Payer: Self-pay | Admitting: Family Medicine

## 2018-01-05 MED FILL — IBRANCE 100 MG CAPSULE: 100 | 21 days supply | Qty: 21 | Fill #1 | Status: TO

## 2018-01-24 ENCOUNTER — Inpatient Hospital Stay: Payer: Medicare Other

## 2018-01-24 ENCOUNTER — Inpatient Hospital Stay: Payer: Medicare Other | Attending: Oncology

## 2018-01-24 VITALS — BP 160/70 | HR 74 | Temp 97.9°F | Resp 18

## 2018-01-24 DIAGNOSIS — Z170 Estrogen receptor positive status [ER+]: Secondary | ICD-10-CM | POA: Insufficient documentation

## 2018-01-24 DIAGNOSIS — C50812 Malignant neoplasm of overlapping sites of left female breast: Secondary | ICD-10-CM | POA: Insufficient documentation

## 2018-01-24 DIAGNOSIS — C50912 Malignant neoplasm of unspecified site of left female breast: Secondary | ICD-10-CM

## 2018-01-24 DIAGNOSIS — Z79818 Long term (current) use of other agents affecting estrogen receptors and estrogen levels: Secondary | ICD-10-CM | POA: Diagnosis not present

## 2018-01-24 DIAGNOSIS — Z79899 Other long term (current) drug therapy: Secondary | ICD-10-CM | POA: Insufficient documentation

## 2018-01-24 DIAGNOSIS — C50012 Malignant neoplasm of nipple and areola, left female breast: Secondary | ICD-10-CM

## 2018-01-24 LAB — CBC WITH DIFFERENTIAL/PLATELET
BASOS ABS: 0.1 10*3/uL (ref 0.0–0.1)
BASOS PCT: 3 %
Eosinophils Absolute: 0 10*3/uL (ref 0.0–0.5)
Eosinophils Relative: 1 %
HEMATOCRIT: 33.5 % — AB (ref 34.8–46.6)
HEMOGLOBIN: 11.1 g/dL — AB (ref 11.6–15.9)
LYMPHS PCT: 31 %
Lymphs Abs: 1 10*3/uL (ref 0.9–3.3)
MCH: 32.4 pg (ref 25.1–34.0)
MCHC: 33.3 g/dL (ref 31.5–36.0)
MCV: 97.4 fL (ref 79.5–101.0)
Monocytes Absolute: 0.2 10*3/uL (ref 0.1–0.9)
Monocytes Relative: 6 %
NEUTROS ABS: 2 10*3/uL (ref 1.5–6.5)
NEUTROS PCT: 59 %
Platelets: 283 10*3/uL (ref 145–400)
RBC: 3.44 MIL/uL — ABNORMAL LOW (ref 3.70–5.45)
RDW: 16.3 % — ABNORMAL HIGH (ref 11.2–14.5)
WBC: 3.3 10*3/uL — ABNORMAL LOW (ref 3.9–10.3)

## 2018-01-24 LAB — COMPREHENSIVE METABOLIC PANEL
ALBUMIN: 3.6 g/dL (ref 3.5–5.0)
ALK PHOS: 60 U/L (ref 40–150)
ALT: 12 U/L (ref 0–55)
AST: 12 U/L (ref 5–34)
Anion gap: 9 (ref 3–11)
BILIRUBIN TOTAL: 0.3 mg/dL (ref 0.2–1.2)
BUN: 23 mg/dL (ref 7–26)
CALCIUM: 9.4 mg/dL (ref 8.4–10.4)
CO2: 24 mmol/L (ref 22–29)
CREATININE: 1.11 mg/dL — AB (ref 0.60–1.10)
Chloride: 108 mmol/L (ref 98–109)
GFR calc Af Amer: 51 mL/min — ABNORMAL LOW (ref >60)
GFR calc non Af Amer: 44 mL/min — ABNORMAL LOW (ref >60)
GLUCOSE: 96 mg/dL (ref 70–140)
Potassium: 4 mmol/L (ref 3.5–5.1)
Sodium: 141 mmol/L (ref 136–145)
TOTAL PROTEIN: 6.5 g/dL (ref 6.4–8.3)

## 2018-01-24 MED ORDER — DENOSUMAB 120 MG/1.7ML ~~LOC~~ SOLN
SUBCUTANEOUS | Status: AC
  Filled 2018-01-24: qty 1.7

## 2018-01-24 MED ORDER — FULVESTRANT 250 MG/5ML IM SOLN
500.0000 mg | Freq: Once | INTRAMUSCULAR | Status: AC
  Administered 2018-01-24: 500 mg via INTRAMUSCULAR

## 2018-01-24 MED ORDER — FULVESTRANT 250 MG/5ML IM SOLN
INTRAMUSCULAR | Status: AC
  Filled 2018-01-24: qty 5

## 2018-01-24 MED ORDER — DENOSUMAB 120 MG/1.7ML ~~LOC~~ SOLN
120.0000 mg | Freq: Once | SUBCUTANEOUS | Status: AC
Start: 2018-01-24 — End: 2018-01-24
  Administered 2018-01-24: 120 mg via SUBCUTANEOUS

## 2018-01-30 MED FILL — IBRANCE 100 MG CAPSULE: 100 | 21 days supply | Qty: 21 | Fill #2 | Status: TO

## 2018-02-01 ENCOUNTER — Other Ambulatory Visit: Payer: Self-pay | Admitting: Family Medicine

## 2018-02-02 ENCOUNTER — Ambulatory Visit (HOSPITAL_COMMUNITY): Payer: Medicare Other

## 2018-02-02 ENCOUNTER — Encounter (HOSPITAL_COMMUNITY): Payer: Medicare Other

## 2018-02-05 ENCOUNTER — Other Ambulatory Visit: Payer: Self-pay | Admitting: Family Medicine

## 2018-02-08 ENCOUNTER — Ambulatory Visit (HOSPITAL_COMMUNITY): Payer: Medicare Other

## 2018-02-13 ENCOUNTER — Ambulatory Visit (HOSPITAL_COMMUNITY)
Admission: RE | Admit: 2018-02-13 | Discharge: 2018-02-13 | Disposition: A | Discharge status: Home / Self Care | Payer: Medicare Other | Source: Ambulatory Visit | Attending: Oncology | Admitting: Oncology

## 2018-02-13 ENCOUNTER — Ambulatory Visit (HOSPITAL_COMMUNITY)
Admission: RE | Admit: 2018-02-13 | Discharge: 2018-02-13 | Disposition: A | Discharge status: Home / Self Care | Payer: Medicare Other | Source: Ambulatory Visit | Attending: Adult Health | Admitting: Adult Health

## 2018-02-13 ENCOUNTER — Encounter (HOSPITAL_COMMUNITY): Payer: Self-pay

## 2018-02-13 DIAGNOSIS — C7800 Secondary malignant neoplasm of unspecified lung: Secondary | ICD-10-CM

## 2018-02-13 DIAGNOSIS — C50012 Malignant neoplasm of nipple and areola, left female breast: Secondary | ICD-10-CM

## 2018-02-13 DIAGNOSIS — Z170 Estrogen receptor positive status [ER+]: Secondary | ICD-10-CM | POA: Insufficient documentation

## 2018-02-13 DIAGNOSIS — R918 Other nonspecific abnormal finding of lung field: Secondary | ICD-10-CM | POA: Insufficient documentation

## 2018-02-13 DIAGNOSIS — C7951 Secondary malignant neoplasm of bone: Secondary | ICD-10-CM | POA: Diagnosis not present

## 2018-02-13 DIAGNOSIS — Z7951 Long term (current) use of inhaled steroids: Secondary | ICD-10-CM | POA: Diagnosis not present

## 2018-02-13 DIAGNOSIS — I700 Atherosclerosis of aorta: Secondary | ICD-10-CM | POA: Diagnosis not present

## 2018-02-13 DIAGNOSIS — R911 Solitary pulmonary nodule: Secondary | ICD-10-CM | POA: Insufficient documentation

## 2018-02-13 DIAGNOSIS — C7901 Secondary malignant neoplasm of right kidney and renal pelvis: Secondary | ICD-10-CM | POA: Diagnosis not present

## 2018-02-13 DIAGNOSIS — C50812 Malignant neoplasm of overlapping sites of left female breast: Secondary | ICD-10-CM

## 2018-02-13 LAB — GLUCOSE, CAPILLARY: GLUCOSE-CAPILLARY: 89 mg/dL (ref 65–99)

## 2018-02-13 MED ORDER — IOPAMIDOL (ISOVUE-300) INJECTION 61%
INTRAVENOUS | Status: AC
  Administered 2018-02-13: 60 mL via INTRAVENOUS
  Filled 2018-02-13: qty 75

## 2018-02-13 MED ORDER — IOPAMIDOL (ISOVUE-300) INJECTION 61%
75.0000 mL | Freq: Once | INTRAVENOUS | Status: AC | PRN
  Administered 2018-02-13: 60 mL via INTRAVENOUS

## 2018-02-13 MED ORDER — FLUDEOXYGLUCOSE F - 18 (FDG) INJECTION
7.7400 | Freq: Once | INTRAVENOUS | Status: AC | PRN
  Administered 2018-02-13: 7.74 via INTRAVENOUS

## 2018-02-14 ENCOUNTER — Other Ambulatory Visit: Payer: Self-pay | Admitting: Oncology

## 2018-02-19 ENCOUNTER — Other Ambulatory Visit: Payer: Self-pay | Admitting: Oncology

## 2018-02-19 NOTE — Progress Notes (Signed)
I called April [name] and gave her the results of her PET scan which are improved.  She actually does not have a visit with me until May but I have asked her to stop by this week when she comes in for her shot and we will give her a copy of that study.

## 2018-02-21 ENCOUNTER — Ambulatory Visit: Payer: Medicare Other

## 2018-02-21 ENCOUNTER — Other Ambulatory Visit: Payer: Medicare Other

## 2018-02-21 MED ORDER — FULVESTRANT 250 MG/5ML IM SOLN
INTRAMUSCULAR | Status: AC
  Filled 2018-02-21: qty 5

## 2018-02-21 MED ORDER — DENOSUMAB 120 MG/1.7ML ~~LOC~~ SOLN
SUBCUTANEOUS | Status: AC
  Filled 2018-02-21: qty 1.7

## 2018-02-23 ENCOUNTER — Inpatient Hospital Stay: Payer: Medicare Other | Attending: Oncology

## 2018-02-23 ENCOUNTER — Inpatient Hospital Stay: Payer: Medicare Other

## 2018-02-23 ENCOUNTER — Other Ambulatory Visit: Payer: Self-pay

## 2018-02-23 DIAGNOSIS — Z79818 Long term (current) use of other agents affecting estrogen receptors and estrogen levels: Secondary | ICD-10-CM | POA: Diagnosis not present

## 2018-02-23 DIAGNOSIS — C50012 Malignant neoplasm of nipple and areola, left female breast: Secondary | ICD-10-CM

## 2018-02-23 DIAGNOSIS — C50812 Malignant neoplasm of overlapping sites of left female breast: Secondary | ICD-10-CM | POA: Insufficient documentation

## 2018-02-23 DIAGNOSIS — Z170 Estrogen receptor positive status [ER+]: Principal | ICD-10-CM

## 2018-02-23 DIAGNOSIS — C50912 Malignant neoplasm of unspecified site of left female breast: Secondary | ICD-10-CM

## 2018-02-23 DIAGNOSIS — Z79899 Other long term (current) drug therapy: Secondary | ICD-10-CM | POA: Insufficient documentation

## 2018-02-23 LAB — CMP (CANCER CENTER ONLY)
ALBUMIN: 3.7 g/dL (ref 3.5–5.0)
ALK PHOS: 49 U/L (ref 40–150)
ALT: 11 U/L (ref 0–55)
ANION GAP: 7 (ref 3–11)
AST: 13 U/L (ref 5–34)
BUN: 17 mg/dL (ref 7–26)
CALCIUM: 8.7 mg/dL (ref 8.4–10.4)
CO2: 25 mmol/L (ref 22–29)
Chloride: 108 mmol/L (ref 98–109)
Creatinine: 1.13 mg/dL — ABNORMAL HIGH (ref 0.60–1.10)
GFR, EST AFRICAN AMERICAN: 50 mL/min — AB (ref >60)
GFR, Estimated: 43 mL/min — ABNORMAL LOW (ref >60)
Glucose, Bld: 104 mg/dL (ref 70–140)
Potassium: 4.5 mmol/L (ref 3.5–5.1)
SODIUM: 140 mmol/L (ref 136–145)
TOTAL PROTEIN: 6.4 g/dL (ref 6.4–8.3)
Total Bilirubin: <0.2 mg/dL — ABNORMAL LOW (ref 0.2–1.2)

## 2018-02-23 LAB — CBC WITH DIFFERENTIAL/PLATELET
BASOS PCT: 4 %
Basophils Absolute: 0.1 10*3/uL (ref 0.0–0.1)
EOS ABS: 0 10*3/uL (ref 0.0–0.5)
Eosinophils Relative: 1 %
HCT: 32.5 % — ABNORMAL LOW (ref 34.8–46.6)
HEMOGLOBIN: 11 g/dL — AB (ref 11.6–15.9)
LYMPHS ABS: 1 10*3/uL (ref 0.9–3.3)
Lymphocytes Relative: 36 %
MCH: 33.5 pg (ref 25.1–34.0)
MCHC: 33.8 g/dL (ref 31.5–36.0)
MCV: 99.3 fL (ref 79.5–101.0)
MONOS PCT: 8 %
Monocytes Absolute: 0.2 10*3/uL (ref 0.1–0.9)
Neutro Abs: 1.4 10*3/uL — ABNORMAL LOW (ref 1.5–6.5)
Neutrophils Relative %: 51 %
Platelets: 225 10*3/uL (ref 145–400)
RBC: 3.28 MIL/uL — ABNORMAL LOW (ref 3.70–5.45)
RDW: 16.7 % — ABNORMAL HIGH (ref 11.2–14.5)
WBC: 2.7 10*3/uL — ABNORMAL LOW (ref 3.9–10.3)

## 2018-02-23 MED ORDER — FULVESTRANT 250 MG/5ML IM SOLN
500.0000 mg | Freq: Once | INTRAMUSCULAR | Status: AC
  Administered 2018-02-23: 500 mg via INTRAMUSCULAR

## 2018-02-23 MED ORDER — DENOSUMAB 120 MG/1.7ML ~~LOC~~ SOLN
SUBCUTANEOUS | Status: AC
  Filled 2018-02-23: qty 1.7

## 2018-02-23 MED ORDER — FULVESTRANT 250 MG/5ML IM SOLN
INTRAMUSCULAR | Status: AC
Start: 2018-02-23 — End: ?
  Filled 2018-02-23: qty 10

## 2018-02-23 MED ORDER — DENOSUMAB 120 MG/1.7ML ~~LOC~~ SOLN
120.0000 mg | Freq: Once | SUBCUTANEOUS | Status: AC
  Administered 2018-02-23: 120 mg via SUBCUTANEOUS

## 2018-02-23 NOTE — Patient Instructions (Signed)
Fulvestrant injection What is this medicine? FULVESTRANT (ful VES trant) blocks the effects of estrogen. It is used to treat breast cancer. This medicine may be used for other purposes; ask your health care provider or pharmacist if you have questions. COMMON BRAND NAME(S): FASLODEX What should I tell my health care provider before I take this medicine? They need to know if you have any of these conditions: -bleeding problems -liver disease -low levels of platelets in the blood -an unusual or allergic reaction to fulvestrant, other medicines, foods, dyes, or preservatives -pregnant or trying to get pregnant -breast-feeding How should I use this medicine? This medicine is for injection into a muscle. It is usually given by a health care professional in a hospital or clinic setting. Talk to your pediatrician regarding the use of this medicine in children. Special care may be needed. Overdosage: If you think you have taken too much of this medicine contact a poison control center or emergency room at once. NOTE: This medicine is only for you. Do not share this medicine with others. What if I miss a dose? It is important not to miss your dose. Call your doctor or health care professional if you are unable to keep an appointment. What may interact with this medicine? -medicines that treat or prevent blood clots like warfarin, enoxaparin, and dalteparin This list may not describe all possible interactions. Give your health care provider a list of all the medicines, herbs, non-prescription drugs, or dietary supplements you use. Also tell them if you smoke, drink alcohol, or use illegal drugs. Some items may interact with your medicine. What should I watch for while using this medicine? Your condition will be monitored carefully while you are receiving this medicine. You will need important blood work done while you are taking this medicine. Do not become pregnant while taking this medicine or for  at least 1 year after stopping it. Women of child-bearing potential will need to have a negative pregnancy test before starting this medicine. Women should inform their doctor if they wish to become pregnant or think they might be pregnant. There is a potential for serious side effects to an unborn child. Men should inform their doctors if they wish to father a child. This medicine may lower sperm counts. Talk to your health care professional or pharmacist for more information. Do not breast-feed an infant while taking this medicine or for 1 year after the last dose. What side effects may I notice from receiving this medicine? Side effects that you should report to your doctor or health care professional as soon as possible: -allergic reactions like skin rash, itching or hives, swelling of the face, lips, or tongue -feeling faint or lightheaded, falls -pain, tingling, numbness, or weakness in the legs -signs and symptoms of infection like fever or chills; cough; flu-like symptoms; sore throat -vaginal bleeding Side effects that usually do not require medical attention (report to your doctor or health care professional if they continue or are bothersome): -aches, pains -constipation -diarrhea -headache -hot flashes -nausea, vomiting -pain at site where injected -stomach pain This list may not describe all possible side effects. Call your doctor for medical advice about side effects. You may report side effects to FDA at 1-800-FDA-1088. Where should I keep my medicine? This drug is given in a hospital or clinic and will not be stored at home. NOTE: This sheet is a summary. It may not cover all possible information. If you have questions about this medicine, talk to your   doctor, pharmacist, or health care provider.  2018 Elsevier/Gold Standard (2015-06-12 11:03:55) Denosumab injection What is this medicine? DENOSUMAB (den oh sue mab) slows bone breakdown. Prolia is used to treat osteoporosis in  women after menopause and in men. Delton See is used to treat a high calcium level due to cancer and to prevent bone fractures and other bone problems caused by multiple myeloma or cancer bone metastases. Delton See is also used to treat giant cell tumor of the bone. This medicine may be used for other purposes; ask your health care provider or pharmacist if you have questions. COMMON BRAND NAME(S): Prolia, XGEVA What should I tell my health care provider before I take this medicine? They need to know if you have any of these conditions: -dental disease -having surgery or tooth extraction -infection -kidney disease -low levels of calcium or Vitamin D in the blood -malnutrition -on hemodialysis -skin conditions or sensitivity -thyroid or parathyroid disease -an unusual reaction to denosumab, other medicines, foods, dyes, or preservatives -pregnant or trying to get pregnant -breast-feeding How should I use this medicine? This medicine is for injection under the skin. It is given by a health care professional in a hospital or clinic setting. If you are getting Prolia, a special MedGuide will be given to you by the pharmacist with each prescription and refill. Be sure to read this information carefully each time. For Prolia, talk to your pediatrician regarding the use of this medicine in children. Special care may be needed. For Delton See, talk to your pediatrician regarding the use of this medicine in children. While this drug may be prescribed for children as young as 13 years for selected conditions, precautions do apply. Overdosage: If you think you have taken too much of this medicine contact a poison control center or emergency room at once. NOTE: This medicine is only for you. Do not share this medicine with others. What if I miss a dose? It is important not to miss your dose. Call your doctor or health care professional if you are unable to keep an appointment. What may interact with this  medicine? Do not take this medicine with any of the following medications: -other medicines containing denosumab This medicine may also interact with the following medications: -medicines that lower your chance of fighting infection -steroid medicines like prednisone or cortisone This list may not describe all possible interactions. Give your health care provider a list of all the medicines, herbs, non-prescription drugs, or dietary supplements you use. Also tell them if you smoke, drink alcohol, or use illegal drugs. Some items may interact with your medicine. What should I watch for while using this medicine? Visit your doctor or health care professional for regular checks on your progress. Your doctor or health care professional may order blood tests and other tests to see how you are doing. Call your doctor or health care professional for advice if you get a fever, chills or sore throat, or other symptoms of a cold or flu. Do not treat yourself. This drug may decrease your body's ability to fight infection. Try to avoid being around people who are sick. You should make sure you get enough calcium and vitamin D while you are taking this medicine, unless your doctor tells you not to. Discuss the foods you eat and the vitamins you take with your health care professional. See your dentist regularly. Brush and floss your teeth as directed. Before you have any dental work done, tell your dentist you are receiving this medicine. Do  not become pregnant while taking this medicine or for 5 months after stopping it. Talk with your doctor or health care professional about your birth control options while taking this medicine. Women should inform their doctor if they wish to become pregnant or think they might be pregnant. There is a potential for serious side effects to an unborn child. Talk to your health care professional or pharmacist for more information. What side effects may I notice from receiving this  medicine? Side effects that you should report to your doctor or health care professional as soon as possible: -allergic reactions like skin rash, itching or hives, swelling of the face, lips, or tongue -bone pain -breathing problems -dizziness -jaw pain, especially after dental work -redness, blistering, peeling of the skin -signs and symptoms of infection like fever or chills; cough; sore throat; pain or trouble passing urine -signs of low calcium like fast heartbeat, muscle cramps or muscle pain; pain, tingling, numbness in the hands or feet; seizures -unusual bleeding or bruising -unusually weak or tired Side effects that usually do not require medical attention (report to your doctor or health care professional if they continue or are bothersome): -constipation -diarrhea -headache -joint pain -loss of appetite -muscle pain -runny nose -tiredness -upset stomach This list may not describe all possible side effects. Call your doctor for medical advice about side effects. You may report side effects to FDA at 1-800-FDA-1088. Where should I keep my medicine? This medicine is only given in a clinic, doctor's office, or other health care setting and will not be stored at home. NOTE: This sheet is a summary. It may not cover all possible information. If you have questions about this medicine, talk to your doctor, pharmacist, or health care provider.  2018 Elsevier/Gold Standard (2016-12-06 19:17:21)  

## 2018-02-26 MED FILL — IBRANCE 100 MG CAPSULE: 100 | 28 days supply | Qty: 21 | Fill #3 | Status: TO

## 2018-03-12 ENCOUNTER — Other Ambulatory Visit: Payer: Self-pay | Admitting: Pharmacist

## 2018-03-21 ENCOUNTER — Telehealth: Payer: Self-pay | Admitting: *Deleted

## 2018-03-21 ENCOUNTER — Inpatient Hospital Stay: Payer: Medicare Other | Attending: Oncology

## 2018-03-21 ENCOUNTER — Inpatient Hospital Stay: Payer: Medicare Other

## 2018-03-21 ENCOUNTER — Encounter: Payer: Self-pay | Admitting: Emergency Medicine

## 2018-03-21 VITALS — BP 146/76 | HR 72 | Temp 98.3°F | Resp 18

## 2018-03-21 DIAGNOSIS — C50912 Malignant neoplasm of unspecified site of left female breast: Secondary | ICD-10-CM

## 2018-03-21 DIAGNOSIS — Z170 Estrogen receptor positive status [ER+]: Secondary | ICD-10-CM | POA: Diagnosis not present

## 2018-03-21 DIAGNOSIS — Z79818 Long term (current) use of other agents affecting estrogen receptors and estrogen levels: Secondary | ICD-10-CM | POA: Diagnosis not present

## 2018-03-21 DIAGNOSIS — Z79899 Other long term (current) drug therapy: Secondary | ICD-10-CM | POA: Diagnosis not present

## 2018-03-21 DIAGNOSIS — C50812 Malignant neoplasm of overlapping sites of left female breast: Secondary | ICD-10-CM | POA: Insufficient documentation

## 2018-03-21 DIAGNOSIS — C50012 Malignant neoplasm of nipple and areola, left female breast: Secondary | ICD-10-CM

## 2018-03-21 LAB — CBC WITH DIFFERENTIAL/PLATELET
Basophils Absolute: 0.1 10*3/uL (ref 0.0–0.1)
Basophils Relative: 3 %
EOS PCT: 2 %
Eosinophils Absolute: 0 10*3/uL (ref 0.0–0.5)
HCT: 32.5 % — ABNORMAL LOW (ref 34.8–46.6)
Hemoglobin: 10.9 g/dL — ABNORMAL LOW (ref 11.6–15.9)
LYMPHS ABS: 1 10*3/uL (ref 0.9–3.3)
LYMPHS PCT: 35 %
MCH: 33.1 pg (ref 25.1–34.0)
MCHC: 33.5 g/dL (ref 31.5–36.0)
MCV: 98.9 fL (ref 79.5–101.0)
Monocytes Absolute: 0.2 10*3/uL (ref 0.1–0.9)
Monocytes Relative: 8 %
Neutro Abs: 1.4 10*3/uL — ABNORMAL LOW (ref 1.5–6.5)
Neutrophils Relative %: 52 %
PLATELETS: 237 10*3/uL (ref 145–400)
RBC: 3.28 MIL/uL — AB (ref 3.70–5.45)
RDW: 15.8 % — ABNORMAL HIGH (ref 11.2–14.5)
WBC: 2.7 10*3/uL — AB (ref 3.9–10.3)

## 2018-03-21 MED ORDER — FULVESTRANT 250 MG/5ML IM SOLN
INTRAMUSCULAR | Status: AC
  Filled 2018-03-21: qty 5

## 2018-03-21 MED ORDER — DENOSUMAB 120 MG/1.7ML ~~LOC~~ SOLN
120.0000 mg | Freq: Once | SUBCUTANEOUS | Status: AC
  Administered 2018-03-21: 120 mg via SUBCUTANEOUS

## 2018-03-21 MED ORDER — FULVESTRANT 250 MG/5ML IM SOLN
500.0000 mg | Freq: Once | INTRAMUSCULAR | Status: AC
  Administered 2018-03-21: 500 mg via INTRAMUSCULAR

## 2018-03-21 MED ORDER — DENOSUMAB 120 MG/1.7ML ~~LOC~~ SOLN
SUBCUTANEOUS | Status: AC
  Filled 2018-03-21: qty 1.7

## 2018-03-21 NOTE — Telephone Encounter (Signed)
Per MD may use CMET from 02/23/2018 for xgeva injection today

## 2018-03-21 NOTE — Progress Notes (Signed)
Ok per Dr.Magrinat to proceed w/ xgeva today with CMET drawn from 02/23/18. Pharmacy made aware.

## 2018-03-26 MED FILL — IBRANCE 100 MG CAPSULE: 100 | 28 days supply | Qty: 21 | Fill #4 | Status: TO

## 2018-04-04 ENCOUNTER — Telehealth: Payer: Self-pay | Admitting: Oncology

## 2018-04-04 NOTE — Telephone Encounter (Signed)
Left message for patient regarding VM patient left. She stated she will be moving out of state and asked to cancel all future appointments.

## 2018-04-06 ENCOUNTER — Telehealth: Payer: Self-pay | Admitting: *Deleted

## 2018-04-06 NOTE — Telephone Encounter (Signed)
Call from pt's son Milanie [facility]) reporting she is moving to [place]. He requests records to be faxed to [facility] at [phone]. He will be the contact person for appointments.  Message forwarded to HIM and Dr. Virgie Dad nurse.

## 2018-04-09 ENCOUNTER — Telehealth: Payer: Self-pay | Admitting: Oncology

## 2018-04-09 NOTE — Telephone Encounter (Signed)
Spoke with patient's son, Kateleen Encarnacion, regarding medical records being faxed to the [facility].  Informed him they will need to fax a request asking for records.  Gave him fax number to give to facility to expedite a request.

## 2018-04-10 ENCOUNTER — Telehealth: Payer: Self-pay | Admitting: Oncology

## 2018-04-10 NOTE — Telephone Encounter (Signed)
FAXED RECORDS TO [place] RELEASE ID [id number]

## 2018-04-17 MED FILL — IBRANCE 100 MG CAPSULE: 100 | 28 days supply | Qty: 21 | Fill #5 | Status: TO

## 2018-04-18 ENCOUNTER — Ambulatory Visit: Payer: Medicare Other | Admitting: Oncology

## 2018-04-18 ENCOUNTER — Other Ambulatory Visit: Payer: Medicare Other

## 2018-04-18 ENCOUNTER — Ambulatory Visit: Payer: Medicare Other

## 2018-05-07 ENCOUNTER — Other Ambulatory Visit: Payer: Self-pay | Admitting: Family Medicine

## 2018-06-04 IMAGING — CT NM PET TUM IMG RESTAG (PS) SKULL BASE T - THIGH
1 of 9 series · 1 of 25 positions shown · non-contrast
Comparison: Chest CT 02/13/2018.  PET-CT 03/20/2017

CLINICAL DATA: Subsequent treatment strategy for left breast
cancer.

EXAM:
NUCLEAR MEDICINE PET SKULL BASE TO THIGH
TECHNIQUE: 7.74 mCi F-18 FDG was injected intravenously. Full-ring PET imaging
was performed from the skull base to thigh after the radiotracer. CT
data was obtained and used for attenuation correction and anatomic
localization.
Fasting blood glucose: 89 mg/dl
Mediastinal blood pool activity: SUV max

[Series 4: ct sk_thigh 5.0 b31f · axial · 5.0mm · 0.98mm/px · 1 of 220 slices shown]
[im 220/220  brain]
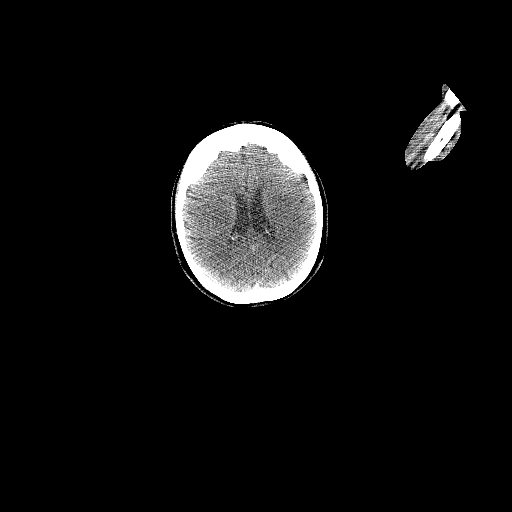

[1 of 25 positions shown; findings below may reference images not displayed]

FINDINGS: NECK:

No hypermetabolic cervical lymph nodes are identified.There are no
lesions of the pharyngeal mucosal space.

Incidental CT findings: none

CHEST:

Interval significant improvement in the previously demonstrated
hypermetabolic mediastinal and left hilar adenopathy. There is
residual hypermetabolic activity in the left hilum (SUV max 5.7).
Previously 18.5. There is no other residual hypermetabolic nodal
activity. The left subpectoral activity has also improved with an
SUV max 2.4 (previously 5.8). No suspicious pulmonary activity.

Incidental CT findings: 7 x 6 mm right upper lobe nodule on image
33/8 is unchanged and not hypermetabolic. There is residual chronic
atelectasis in the lingula with volume loss and narrowing of the
central left upper lobe bronchus.

ABDOMEN/PELVIS:

There is no hypermetabolic activity within the liver, adrenal
glands, spleen or pancreas. There is no hypermetabolic nodal
activity. 3.8 x 3.2 cm mass projecting anteriorly from the right
kidney demonstrates low level hypermetabolic activity (SUV max 2.7).
This has been previously biopsied in 5611 and was not oncocytic
neoplasm. This lesion has mildly enlarged from abdominal MRI
07/22/2015 at which time it measured 3.1 x 3.0 cm.

Incidental CT findings: Cyst centrally in the right hepatic lobe,
prior cholecystectomy and bilateral renal cysts are noted. There is
aortic and branch vessel atherosclerosis.

SKELETON:

There is no hypermetabolic activity to suggest osseous metastatic
disease. Stable sclerotic osseous lesions, largest in the manubrium.

Incidental CT findings: none
IMPRESSION: 1. Interval significant response to therapy. There is mild residual
hypermetabolic activity within the left hilum and left subpectoral
regions. No other hypermetabolic lymph nodes or progressive disease.
2. Known oncocytic neoplasm involving the anterior right kidney
again noted, mildly enlarged from abdominal MRI 07/22/2015.
[DATE]. Stable small right upper lobe pulmonary nodule.

## 2018-06-04 IMAGING — CT CT CHEST W/ CM
2 of 4 series · 15 of 36 positions shown, 18 images · IV contrast (isovue)
Comparison: CT chest 09/26/2017

CLINICAL DATA: Patient with history of breast cancer. Follow-up
evaluation.

EXAM:
CT CHEST WITH CONTRAST
TECHNIQUE: Multidetector CT imaging of the chest was performed during
intravenous contrast administration.
CONTRAST:  60 cc Isovue 300

[Series 4: axial st · axial · 0.69mm/px · z∈[-304,-40]mm · 12 of 155 slices shown, 15 images]
[im 12/155  mediastinal]
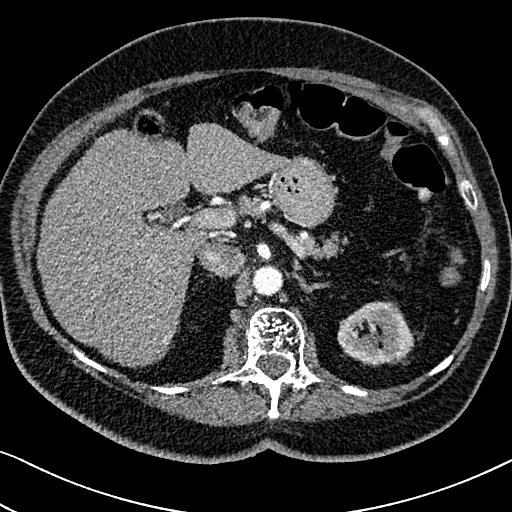
[im 12/155  lung]
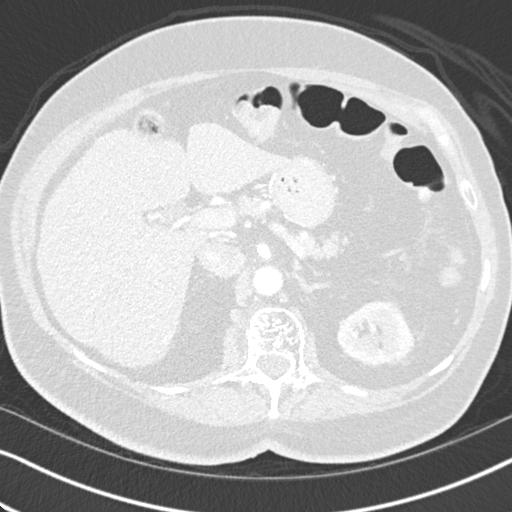
[im 23/155  lung]
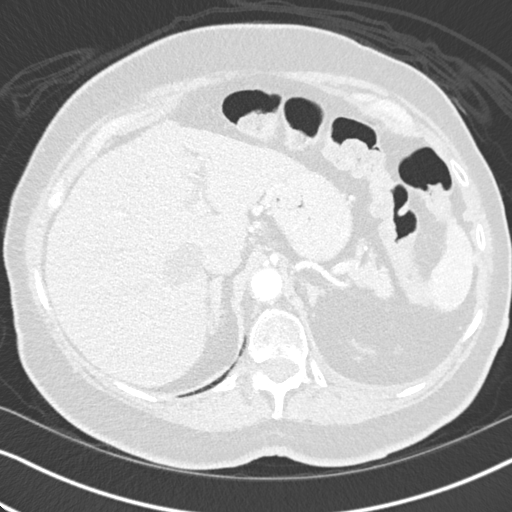
[im 34/155  lung]
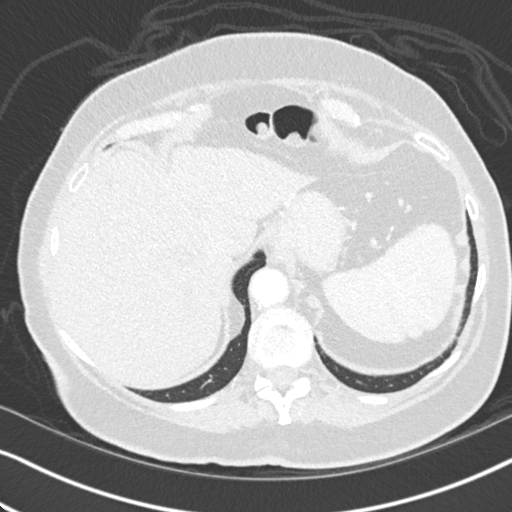
[im 45/155  lung]
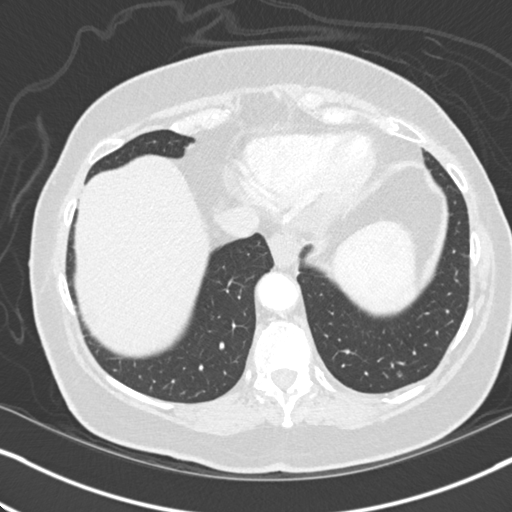
[im 56/155  mediastinal]
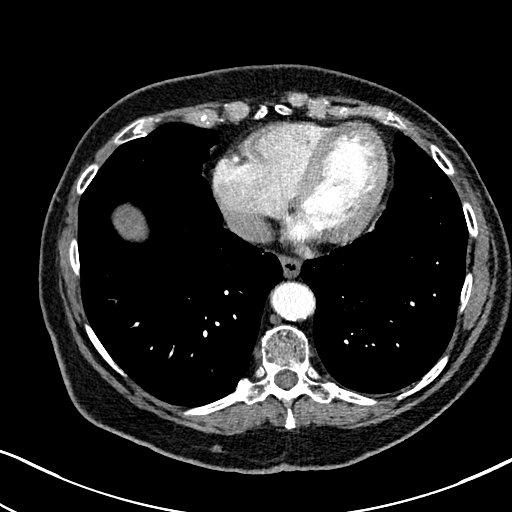
[im 56/155  lung]
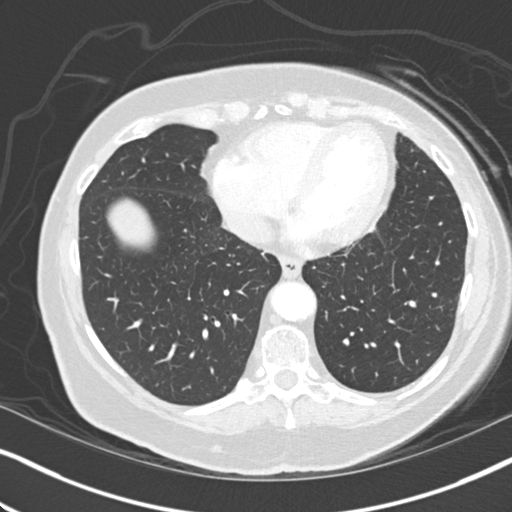
[im 67/155  lung]
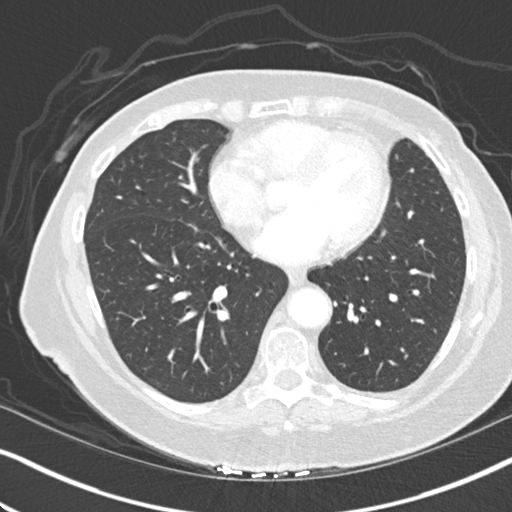
[im 89/155  lung]
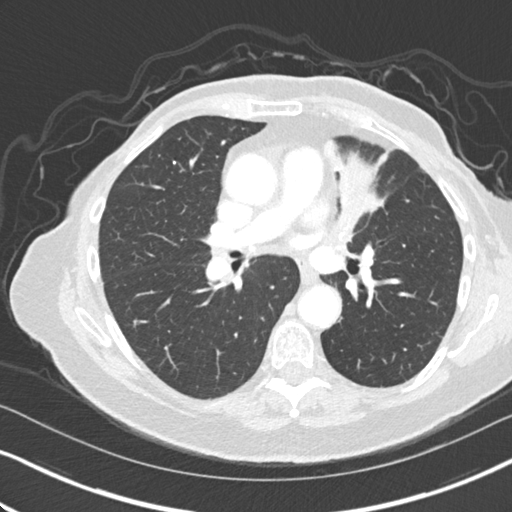
[im 100/155  lung]
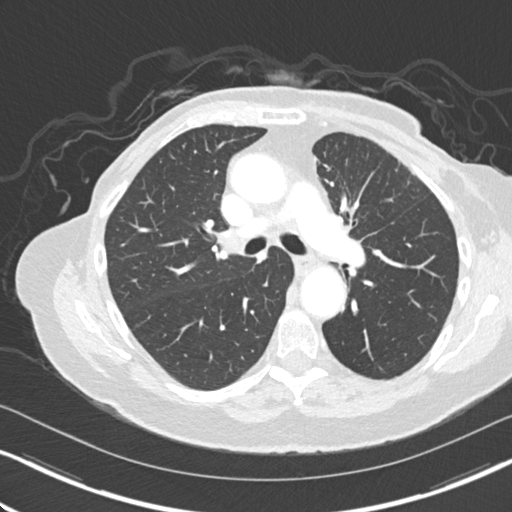
[im 111/155  mediastinal]
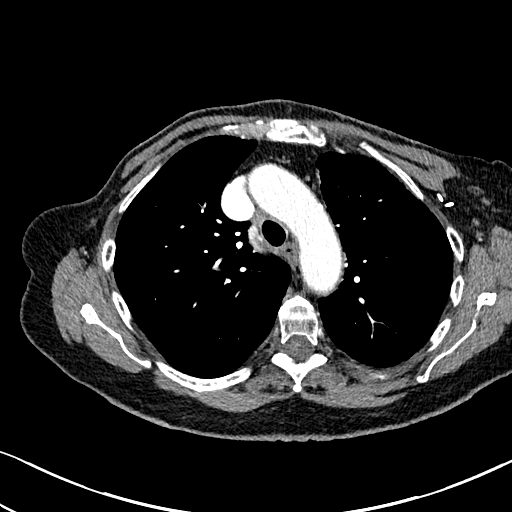
[im 111/155  lung]
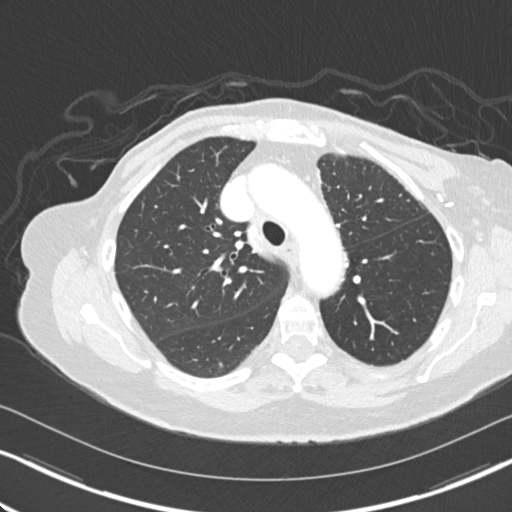
[im 122/155  lung]
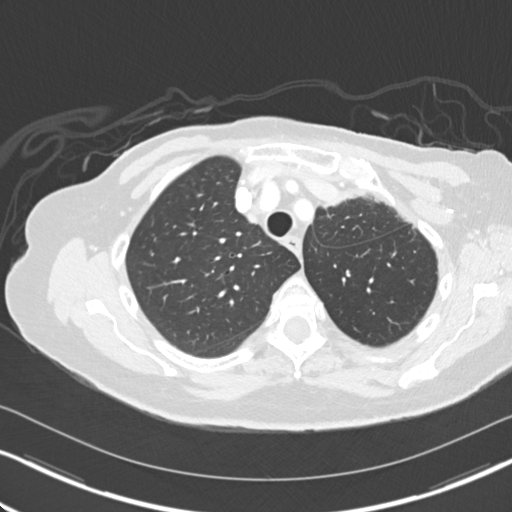
[im 133/155  lung]
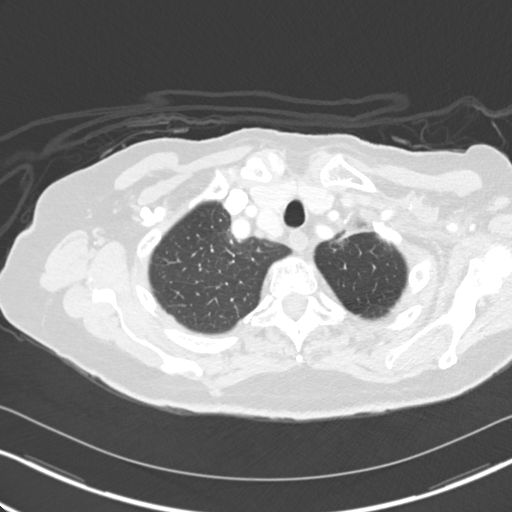
[im 144/155  lung]
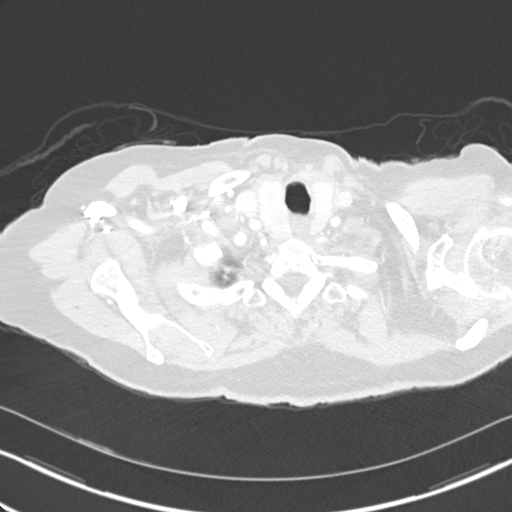

[Series 7: coronal · coronal · 0.63mm/px · 3 of 161 slices shown]
[im 33/161  lung]
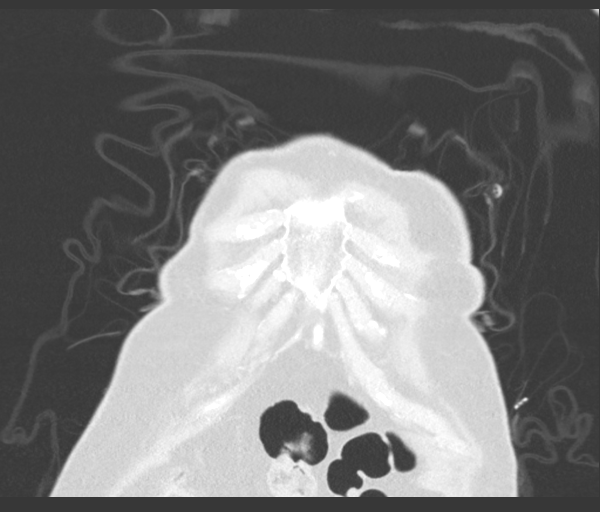
[im 65/161  lung]
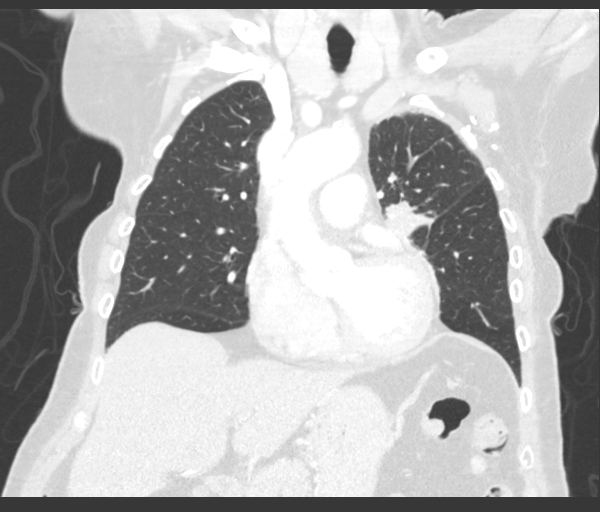
[im 97/161  lung]
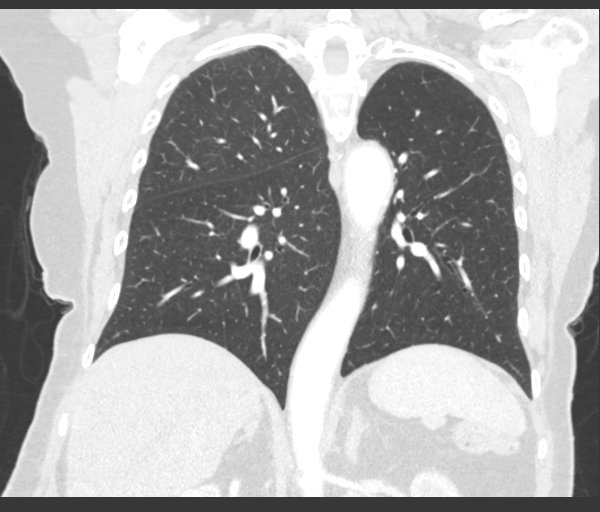

[15 of 36 positions shown; findings below may reference images not displayed]

FINDINGS: Cardiovascular: Normal heart size. Trace pericardial fluid. Thoracic
aortic vascular calcifications.

Mediastinum/Nodes: Postsurgical changes within the left axilla. No
enlarged axillary or mediastinal adenopathy. Similar-appearing
cm central left hilar mass (image 64; series 4). Small hiatal
hernia. Normal esophagus.

Lungs/Pleura: Central airways are patent. Persistent atelectasis
within the medial left upper lobe. Stable 6 mm right upper lobe
nodule (image 64; series 9). Stable 4 mm right lower lobe nodule
(image 83; series 9). Stable 3 mm left lower lobe nodule (image 117;
series 9). Additional scattered nodules in the left lung are stable.
No pleural effusion or pneumothorax.

Upper Abdomen: No acute process. Stable hepatic cyst. Stable focal
area of low attenuation within the region of the pancreatic head
(image 155; series 4).

Musculoskeletal: Similar-appearing sclerotic lesion within the
manubrium (image 31; series 9). Stable sclerotic lesion within the
posterior left eighth rib (image 62; series 9). Similar-appearing
sclerotic lesion within the anterior superior T4 vertebral body
(image 106; series 8).
IMPRESSION: 1. Similar-appearing left suprahilar mass with associated
atelectasis.
2. Stable scattered pulmonary nodules.
3. Stable sclerotic osseous metastatic disease.
4.  Aortic Atherosclerosis ([id number]).

## 2023-08-29 DEATH — deceased
